# Patient Record
Sex: Female | Born: 1950 | Race: White | Hispanic: No | Smoking: Never smoker
Health system: Southern US, Community
[De-identification: ages and names within clinical notes are randomized; demographics above are authoritative.]

## PROBLEM LIST (undated history)

## (undated) DIAGNOSIS — N393 Stress incontinence (female) (male): Secondary | ICD-10-CM

## (undated) DIAGNOSIS — K635 Polyp of colon: Secondary | ICD-10-CM

## (undated) DIAGNOSIS — K439 Ventral hernia without obstruction or gangrene: Secondary | ICD-10-CM

## (undated) DIAGNOSIS — E079 Disorder of thyroid, unspecified: Secondary | ICD-10-CM

## (undated) DIAGNOSIS — I10 Essential (primary) hypertension: Secondary | ICD-10-CM

## (undated) DIAGNOSIS — Z5189 Encounter for other specified aftercare: Secondary | ICD-10-CM

## (undated) DIAGNOSIS — G43909 Migraine, unspecified, not intractable, without status migrainosus: Secondary | ICD-10-CM

## (undated) DIAGNOSIS — I2699 Other pulmonary embolism without acute cor pulmonale: Secondary | ICD-10-CM

## (undated) DIAGNOSIS — M47817 Spondylosis without myelopathy or radiculopathy, lumbosacral region: Secondary | ICD-10-CM

## (undated) DIAGNOSIS — Z87820 Personal history of traumatic brain injury: Secondary | ICD-10-CM

## (undated) DIAGNOSIS — J189 Pneumonia, unspecified organism: Secondary | ICD-10-CM

## (undated) DIAGNOSIS — D126 Benign neoplasm of colon, unspecified: Secondary | ICD-10-CM

## (undated) DIAGNOSIS — N200 Calculus of kidney: Secondary | ICD-10-CM

## (undated) DIAGNOSIS — R0902 Hypoxemia: Secondary | ICD-10-CM

## (undated) DIAGNOSIS — K219 Gastro-esophageal reflux disease without esophagitis: Secondary | ICD-10-CM

## (undated) DIAGNOSIS — K5732 Diverticulitis of large intestine without perforation or abscess without bleeding: Secondary | ICD-10-CM

## (undated) DIAGNOSIS — R569 Unspecified convulsions: Secondary | ICD-10-CM

## (undated) DIAGNOSIS — I890 Lymphedema, not elsewhere classified: Secondary | ICD-10-CM

## (undated) DIAGNOSIS — E669 Obesity, unspecified: Secondary | ICD-10-CM

## (undated) DIAGNOSIS — H8109 Meniere's disease, unspecified ear: Secondary | ICD-10-CM

## (undated) DIAGNOSIS — E785 Hyperlipidemia, unspecified: Secondary | ICD-10-CM

## (undated) DIAGNOSIS — N189 Chronic kidney disease, unspecified: Secondary | ICD-10-CM

## (undated) DIAGNOSIS — K469 Unspecified abdominal hernia without obstruction or gangrene: Secondary | ICD-10-CM

## (undated) DIAGNOSIS — C801 Malignant (primary) neoplasm, unspecified: Secondary | ICD-10-CM

## (undated) DIAGNOSIS — G709 Myoneural disorder, unspecified: Secondary | ICD-10-CM

## (undated) DIAGNOSIS — K5730 Diverticulosis of large intestine without perforation or abscess without bleeding: Secondary | ICD-10-CM

## (undated) DIAGNOSIS — T7840XA Allergy, unspecified, initial encounter: Secondary | ICD-10-CM

## (undated) DIAGNOSIS — K222 Esophageal obstruction: Secondary | ICD-10-CM

## (undated) HISTORY — DX: Essential (primary) hypertension: I10

## (undated) HISTORY — DX: Encounter for other specified aftercare: Z51.89

## (undated) HISTORY — DX: Calculus of kidney: N20.0

## (undated) HISTORY — DX: Pneumonia, unspecified organism: J18.9

## (undated) HISTORY — DX: Meniere's disease, unspecified ear: H81.09

## (undated) HISTORY — PX: APPENDECTOMY: SHX54

## (undated) HISTORY — PX: FEMORAL VARUS OSTEOTOMY W/ ADDUCTOR RELEASE AND ILIAC CREST BONE GRAFT, PELVIC OSTEOTOMY: SHX1591

## (undated) HISTORY — DX: Migraine, unspecified, not intractable, without status migrainosus: G43.909

## (undated) HISTORY — DX: Personal history of traumatic brain injury: Z87.820

## (undated) HISTORY — DX: Benign neoplasm of colon, unspecified: D12.6

## (undated) HISTORY — DX: Spondylosis without myelopathy or radiculopathy, lumbosacral region: M47.817

## (undated) HISTORY — DX: Myoneural disorder, unspecified: G70.9

## (undated) HISTORY — DX: Disorder of thyroid, unspecified: E07.9

## (undated) HISTORY — DX: Diverticulosis of large intestine without perforation or abscess without bleeding: K57.30

## (undated) HISTORY — DX: Polyp of colon: K63.5

## (undated) HISTORY — DX: Ventral hernia without obstruction or gangrene: K43.9

## (undated) HISTORY — DX: Chronic kidney disease, unspecified: N18.9

## (undated) HISTORY — DX: Malignant (primary) neoplasm, unspecified: C80.1

## (undated) HISTORY — PX: HUMERUS FRACTURE SURGERY: SHX670

## (undated) HISTORY — DX: Stress incontinence (female) (male): N39.3

## (undated) HISTORY — DX: Lymphedema, not elsewhere classified: I89.0

## (undated) HISTORY — DX: Hyperlipidemia, unspecified: E78.5

## (undated) HISTORY — DX: Obesity, unspecified: E66.9

## (undated) HISTORY — DX: Diverticulitis of large intestine without perforation or abscess without bleeding: K57.32

## (undated) HISTORY — DX: Unspecified abdominal hernia without obstruction or gangrene: K46.9

## (undated) HISTORY — PX: PARTIAL COLECTOMY: SHX5273

## (undated) HISTORY — PX: HIP FRACTURE SURGERY: SHX118

## (undated) HISTORY — DX: Other pulmonary embolism without acute cor pulmonale: I26.99

## (undated) HISTORY — DX: Allergy, unspecified, initial encounter: T78.40XA

## (undated) HISTORY — DX: Unspecified convulsions: R56.9

## (undated) HISTORY — PX: TENDON TRANSFER: SHX6109

## (undated) HISTORY — PX: HERNIA REPAIR: SHX51

## (undated) HISTORY — PX: ULNAR NERVE REPAIR: SHX2594

## (undated) HISTORY — DX: Gastro-esophageal reflux disease without esophagitis: K21.9

## (undated) HISTORY — DX: Esophageal obstruction: K22.2

## (undated) HISTORY — PX: OTHER SURGICAL HISTORY: SHX169

## (undated) HISTORY — DX: Hypoxemia: R09.02

---

## 1970-04-08 HISTORY — PX: TUBAL LIGATION: SHX77

## 1988-04-08 HISTORY — PX: ABDOMINAL HYSTERECTOMY: SHX81

## 1993-04-08 HISTORY — PX: LUMBAR FUSION: SHX111

## 1995-04-09 HISTORY — PX: COLON SURGERY: SHX602

## 1998-03-28 ENCOUNTER — Other Ambulatory Visit: Admission: RE | Admit: 1998-03-28 | Discharge: 1998-03-28 | Payer: Self-pay | Admitting: Gastroenterology

## 1998-03-28 ENCOUNTER — Inpatient Hospital Stay (HOSPITAL_COMMUNITY): Admission: EM | Admit: 1998-03-28 | Discharge: 1998-03-30 | Payer: Self-pay | Admitting: Gastroenterology

## 1998-04-01 ENCOUNTER — Encounter: Payer: Self-pay | Admitting: Cardiology

## 1999-10-18 ENCOUNTER — Encounter: Payer: Self-pay | Admitting: Family Medicine

## 1999-10-18 ENCOUNTER — Ambulatory Visit (HOSPITAL_COMMUNITY): Admission: RE | Admit: 1999-10-18 | Discharge: 1999-10-18 | Payer: Self-pay | Admitting: Family Medicine

## 2000-04-26 ENCOUNTER — Encounter: Payer: Self-pay | Admitting: Family Medicine

## 2000-04-26 ENCOUNTER — Ambulatory Visit (HOSPITAL_COMMUNITY): Admission: RE | Admit: 2000-04-26 | Discharge: 2000-04-26 | Payer: Self-pay | Admitting: Family Medicine

## 2000-08-21 ENCOUNTER — Ambulatory Visit (HOSPITAL_COMMUNITY): Admission: RE | Admit: 2000-08-21 | Discharge: 2000-08-21 | Payer: Self-pay | Admitting: Family Medicine

## 2000-09-12 ENCOUNTER — Ambulatory Visit (HOSPITAL_COMMUNITY): Admission: RE | Admit: 2000-09-12 | Discharge: 2000-09-12 | Payer: Self-pay | Admitting: Family Medicine

## 2001-02-06 ENCOUNTER — Encounter (INDEPENDENT_AMBULATORY_CARE_PROVIDER_SITE_OTHER): Payer: Self-pay | Admitting: Specialist

## 2001-02-06 ENCOUNTER — Other Ambulatory Visit: Admission: RE | Admit: 2001-02-06 | Discharge: 2001-02-06 | Payer: Self-pay | Admitting: Gastroenterology

## 2001-04-08 HISTORY — PX: FEMUR FRACTURE SURGERY: SHX633

## 2001-08-04 ENCOUNTER — Ambulatory Visit (HOSPITAL_COMMUNITY): Admission: RE | Admit: 2001-08-04 | Discharge: 2001-08-04 | Payer: Self-pay | Admitting: Family Medicine

## 2001-08-04 ENCOUNTER — Encounter: Payer: Self-pay | Admitting: Family Medicine

## 2001-10-13 ENCOUNTER — Encounter: Payer: Self-pay | Admitting: Surgery

## 2001-10-14 ENCOUNTER — Inpatient Hospital Stay (HOSPITAL_COMMUNITY): Admission: RE | Admit: 2001-10-14 | Discharge: 2001-10-16 | Payer: Self-pay | Admitting: Surgery

## 2001-12-14 ENCOUNTER — Encounter: Payer: Self-pay | Admitting: Surgery

## 2001-12-14 ENCOUNTER — Ambulatory Visit (HOSPITAL_COMMUNITY): Admission: RE | Admit: 2001-12-14 | Discharge: 2001-12-14 | Payer: Self-pay | Admitting: Surgery

## 2002-03-16 ENCOUNTER — Encounter: Payer: Self-pay | Admitting: *Deleted

## 2002-03-16 ENCOUNTER — Emergency Department (HOSPITAL_COMMUNITY): Admission: EM | Admit: 2002-03-16 | Discharge: 2002-03-16 | Payer: Self-pay | Admitting: *Deleted

## 2002-04-16 ENCOUNTER — Encounter: Payer: Self-pay | Admitting: *Deleted

## 2002-04-16 ENCOUNTER — Emergency Department (HOSPITAL_COMMUNITY): Admission: EM | Admit: 2002-04-16 | Discharge: 2002-04-16 | Payer: Self-pay | Admitting: *Deleted

## 2006-02-18 ENCOUNTER — Ambulatory Visit: Payer: Self-pay | Admitting: Gastroenterology

## 2006-04-09 ENCOUNTER — Ambulatory Visit: Payer: Self-pay | Admitting: Gastroenterology

## 2007-10-02 LAB — HM PAP SMEAR

## 2007-11-25 LAB — FECAL OCCULT BLOOD, GUAIAC: Fecal Occult Blood: NEGATIVE

## 2008-02-10 ENCOUNTER — Ambulatory Visit: Payer: Self-pay | Admitting: Cardiology

## 2008-02-16 ENCOUNTER — Ambulatory Visit: Payer: Self-pay

## 2009-12-13 LAB — HM MAMMOGRAPHY

## 2010-06-27 ENCOUNTER — Encounter: Payer: Self-pay | Admitting: Family Medicine

## 2010-06-27 DIAGNOSIS — E785 Hyperlipidemia, unspecified: Secondary | ICD-10-CM

## 2010-06-27 DIAGNOSIS — K2270 Barrett's esophagus without dysplasia: Secondary | ICD-10-CM

## 2010-06-27 DIAGNOSIS — G43909 Migraine, unspecified, not intractable, without status migrainosus: Secondary | ICD-10-CM | POA: Insufficient documentation

## 2010-06-27 DIAGNOSIS — K469 Unspecified abdominal hernia without obstruction or gangrene: Secondary | ICD-10-CM

## 2010-06-27 DIAGNOSIS — M47817 Spondylosis without myelopathy or radiculopathy, lumbosacral region: Secondary | ICD-10-CM

## 2010-06-27 DIAGNOSIS — R5383 Other fatigue: Secondary | ICD-10-CM

## 2010-06-27 DIAGNOSIS — R319 Hematuria, unspecified: Secondary | ICD-10-CM | POA: Insufficient documentation

## 2010-06-27 DIAGNOSIS — E669 Obesity, unspecified: Secondary | ICD-10-CM

## 2010-06-27 DIAGNOSIS — N393 Stress incontinence (female) (male): Secondary | ICD-10-CM

## 2010-06-27 DIAGNOSIS — K5730 Diverticulosis of large intestine without perforation or abscess without bleeding: Secondary | ICD-10-CM

## 2010-06-27 DIAGNOSIS — D126 Benign neoplasm of colon, unspecified: Secondary | ICD-10-CM

## 2010-06-27 DIAGNOSIS — K5732 Diverticulitis of large intestine without perforation or abscess without bleeding: Secondary | ICD-10-CM

## 2010-06-27 DIAGNOSIS — I10 Essential (primary) hypertension: Secondary | ICD-10-CM

## 2010-06-27 DIAGNOSIS — K222 Esophageal obstruction: Secondary | ICD-10-CM
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
## 2010-08-24 NOTE — Op Note (Signed)
[facility]  Patient:    Selena Hunt Visit Number: [id number] MRN: [id number]          Service Type: SUR Location: 3W [id number] 01 Attending Physician:  Shelly Rubenstein Dictated by:   Abigail Miyamoto, M.D. Proc. Date: 10/13/01 Admit Date:  10/13/2001 Discharge Date: 10/16/2001                             Operative Report  PREOPERATIVE DIAGNOSIS:  Ventral incisional hernia.  POSTOPERATIVE DIAGNOSIS:  Ventral incisional hernia.  PROCEDURE:  Laparoscopic converted to open ventral incisional hernia repair with mesh.  SURGEON:  Abigail Miyamoto, M.D.  ASSISTANT:  Sheppard Plumber. Earlene Plater, M.D.  ANESTHESIA:  General endotracheal anesthesia.  ESTIMATED BLOOD LOSS:  Minimal.  INDICATIONS FOR PROCEDURE:  Selena Hunt is a 60 year old female who has had multiple abdominal procedures who presents with a ventral incisional hernia. She was interested in a laparoscopic repair so an attempt will be made at laparoscopic surgery.  FINDINGS:  The patient was found to have dense adhesions of omentum and small bowel to the abdominal wall. Laparoscopy could not be performed to fix the hernia. The hernia was repaired with a piece of 15 x 20 cm Prolene mesh.  DESCRIPTION OF PROCEDURE:  The patient was brought to the operating room, identified as Selena Hunt. She was placed supine on the operating room table and general anesthesia was introduced. Her abdomen was then prepped and draped in the usual sterile fashion. Using a #10 blade, a small transverse incision was made in the patients right upper quadrant. The incision will be carried down through the fascia and muscle layers with the electrocautery. The peritoneum was then identified and opened. A #0 Vicryl pursestring suture was then placed around the fascial opening. The Hasson port was placed into the opening and insufflation of the abdomen was begun. Upon entering the abdomen with a camera, the patient was  found to have omentum and small bowel densely adherent over the entire midline. No free area could be identified in the abdominal wall or peritoneal cavity. At this point, it became evident that laparoscopy could not be performed. The camera was thus removed. A midline incision was then created with a #10 scalpel. The previous scar was then excised. The incision was carried down through the fascia with the electrocautery. Several of the small hernia defects in the midline were then connected opening the fascia the entire length of the incision. The patient was found to have omentum and small bowel densely adherent to the abdominal wall. Lysis of adhesions was carried out freeing up the abdominal wall from the omentum and bowel circumferentially. Large skin flaps were then created circumferentially with the electrocautery. The midline fascia was then closed with a running #1 Novofil suture. The open wound was then thoroughly irrigated with normal saline. A piece of Prolene mesh was then brought onto the field and fashioned into a 20 x 15 cm piece of mesh and then placed in an onlay fashion over the midline incision going out wide laterally and covering the old ostomy site. The mesh was then sewn in place with circumferential #1 Novofil pop-off sutures. The wound was then again irrigated. A separate skin incision was then made and a 19 [place] Blake drain was placed over top of the mesh. This was sewn in place with a nylon suture. The subcutaneous layer in the midline was then closed with a running 3-0 Vicryl  suture. The skin was then closed with skin staples. Another #0 Vicryl pursestring suture was placed at the port site in the fascia, both sutures then used to close the fascial defect. All skin incisions were then closed with skin staples. The patient tolerated the procedure well. All sponge, needle and instrument counts were correct at the end of the procedure. The patient was then  extubated in the operating room and taken in stable condition to the recovery room. Dictated by:   Abigail Miyamoto, M.D. Attending Physician:  Shelly Rubenstein DD:  10/13/01 TD:  10/16/01 Job: [id number]

## 2011-03-19 ENCOUNTER — Encounter: Payer: Self-pay | Admitting: Gastroenterology

## 2011-08-19 ENCOUNTER — Encounter: Payer: Self-pay | Admitting: Cardiology

## 2012-07-20 ENCOUNTER — Other Ambulatory Visit: Payer: Self-pay | Admitting: Family Medicine

## 2012-08-18 ENCOUNTER — Other Ambulatory Visit: Payer: Self-pay | Admitting: *Deleted

## 2012-08-18 DIAGNOSIS — N63 Unspecified lump in unspecified breast: Secondary | ICD-10-CM

## 2012-08-27 ENCOUNTER — Encounter (INDEPENDENT_AMBULATORY_CARE_PROVIDER_SITE_OTHER): Payer: Self-pay | Admitting: General Surgery

## 2012-08-27 ENCOUNTER — Ambulatory Visit (INDEPENDENT_AMBULATORY_CARE_PROVIDER_SITE_OTHER): Payer: Medicare Other | Admitting: General Surgery

## 2012-08-27 VITALS — BP 128/72 | HR 59 | Temp 98.8°F | Resp 18 | Ht 61.0 in | Wt 205.0 lb

## 2012-08-27 DIAGNOSIS — N6320 Unspecified lump in the left breast, unspecified quadrant: Secondary | ICD-10-CM | POA: Insufficient documentation

## 2012-08-27 DIAGNOSIS — N63 Unspecified lump in unspecified breast: Secondary | ICD-10-CM
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
## 2012-08-27 NOTE — Patient Instructions (Signed)
Your breast exam today is normal. I do not feel anything abnormal in the breast or in the regional lymph nodes.  Your recent mammograms and ultrasound show a stable mass in the upper outer quadrant left breast, most likely a benign lymph node. This has been present before and is unchanged. This makes it very unlikely to be a cancer.  I do not recommend biopsy or surgery at this point in time.  Be sure to get bilateral mammograms and a physician breast exam in one year and yearly thereafter.  Return to see Dr. Derrell Lolling if any new problems arise.

## 2012-09-16 ENCOUNTER — Telehealth: Payer: Self-pay | Admitting: *Deleted

## 2012-09-16 ENCOUNTER — Ambulatory Visit (INDEPENDENT_AMBULATORY_CARE_PROVIDER_SITE_OTHER): Payer: Medicare Other

## 2012-09-16 ENCOUNTER — Ambulatory Visit: Payer: Self-pay

## 2012-09-16 ENCOUNTER — Other Ambulatory Visit: Payer: Self-pay | Admitting: Family Medicine

## 2012-09-16 DIAGNOSIS — Z9189 Other specified personal risk factors, not elsewhere classified: Secondary | ICD-10-CM

## 2012-09-16 DIAGNOSIS — N959 Unspecified menopausal and perimenopausal disorder: Secondary | ICD-10-CM

## 2012-09-16 DIAGNOSIS — N951 Menopausal and female climacteric states: Secondary | ICD-10-CM

## 2012-09-16 DIAGNOSIS — Z789 Other specified health status: Secondary | ICD-10-CM

## 2012-09-17 ENCOUNTER — Encounter: Payer: Self-pay | Admitting: Family Medicine

## 2012-09-17 ENCOUNTER — Ambulatory Visit (INDEPENDENT_AMBULATORY_CARE_PROVIDER_SITE_OTHER): Payer: Medicare Other | Admitting: Family Medicine

## 2012-09-17 VITALS — BP 106/65 | HR 62 | Temp 98.3°F | Ht 61.5 in | Wt 201.4 lb

## 2012-09-17 DIAGNOSIS — E039 Hypothyroidism, unspecified: Secondary | ICD-10-CM

## 2012-09-17 DIAGNOSIS — E559 Vitamin D deficiency, unspecified: Secondary | ICD-10-CM

## 2012-09-17 DIAGNOSIS — I10 Essential (primary) hypertension: Secondary | ICD-10-CM

## 2012-09-17 DIAGNOSIS — R5381 Other malaise: Secondary | ICD-10-CM

## 2012-09-17 DIAGNOSIS — Z139 Encounter for screening, unspecified: Secondary | ICD-10-CM

## 2012-09-17 DIAGNOSIS — E785 Hyperlipidemia, unspecified: Secondary | ICD-10-CM

## 2012-09-17 DIAGNOSIS — K219 Gastro-esophageal reflux disease without esophagitis: Secondary | ICD-10-CM

## 2012-09-17 DIAGNOSIS — R5383 Other fatigue: Secondary | ICD-10-CM
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
## 2012-09-17 NOTE — Patient Instructions (Addendum)
Fall precautions discussed Continue current meds and therapeutic lifestyle changes We will arrange visit for Pap and pelvic with Dr. Adine Madura. We will remind him to check the breasts and review the abnormal mammogram. Please try the less clumsy and paid more attention to her movements Drink plenty of fluids

## 2012-10-20 ENCOUNTER — Encounter: Payer: Self-pay | Admitting: Obstetrics & Gynecology

## 2012-10-20 ENCOUNTER — Ambulatory Visit (INDEPENDENT_AMBULATORY_CARE_PROVIDER_SITE_OTHER): Payer: MEDICARE | Admitting: Obstetrics & Gynecology

## 2012-10-20 VITALS — BP 138/87 | HR 64 | Resp 16 | Ht 61.0 in | Wt 203.0 lb

## 2012-10-20 DIAGNOSIS — Z01419 Encounter for gynecological examination (general) (routine) without abnormal findings: Secondary | ICD-10-CM

## 2012-10-20 NOTE — Progress Notes (Signed)
Patient ID: Selena Hunt, female   DOB: [birth year], 62 y.o.   MRN: [id number]  Chief Complaint  Patient presents with  . Gynecologic Exam    HPI Selena Hunt is a 62 y.o. female.  No obstetric history on file. No LMP recorded. Patient has had a hysterectomy. Referred by Dr. Christell Constant for pelvic exam. She had a TAH/BSO, ant and posterior repair for benign disease 1990. Recent evaluation by Dr. Derrell Lolling for abnormal mammogram. HPI  Past Medical History  Diagnosis Date  . Hernia of unspecified site of abdominal cavity without mention of obstruction or gangrene   . Migraine headache   . Lumbosacral spondylosis without myelopathy   . Diverticulosis of colon (without mention of hemorrhage)   . Diverticulitis of colon (without mention of hemorrhage)   . Obesity   . Fatigue   . Female stress incontinence   . Essential hypertension, benign   . Hematuria   . Other and unspecified hyperlipidemia   . Benign neoplasm of colon   . Barrett's esophagus   . Stricture and stenosis of esophagus   . Blood transfusion without reported diagnosis   . GERD (gastroesophageal reflux disease)   . Chronic kidney disease   . Seizures   . Thyroid disease   . Pulmonary embolism     Past Surgical History  Procedure Laterality Date  . Abdominal hysterectomy  1990  . Back surgery  1995    Fusion L4 - L5  . Colon surgery  1997    Removed  . Tubal ligation  1972  . Femur fracture surgery    . Hip fracture surgery    . Ulnar nerve repair    . Tendon transfer    . Humerus fracture surgery    . Femoral varus osteotomy w/ adductor release and iliac crest bone graft, pelvic osteotomy      Family History  Problem Relation Age of Onset  . Alzheimer's disease Mother   . Alzheimer's disease Father     Social History History  Substance Use Topics  . Smoking status: Never Smoker   . Smokeless tobacco: Never Used  . Alcohol Use: No    Allergies  Allergen Reactions  . Ambien (Zolpidem Tartrate) Anxiety   . Diovan (Valsartan) Other (See Comments)    Seizures.  . Wellbutrin (Bupropion) Other (See Comments)    Seizures.  . Codeine   . Feldene (Piroxicam)   . Guaifenesin Er   . Latex   . Lipitor (Atorvastatin Calcium)   . Neurontin (Gabapentin)   . Prednisolone   . Zocor (Simvastatin)   . Livalo (Pitavastatin) Swelling    Current Outpatient Prescriptions  Medication Sig Dispense Refill  . calcium-vitamin D (OSCAL WITH D) 500-200 MG-UNIT per tablet Take 1 tablet by mouth daily.      . hydrochlorothiazide (HYDRODIURIL) 25 MG tablet Take 12.5 mg by mouth daily.       Marland Kitchen levothyroxine (SYNTHROID) 100 MCG tablet 1/2 tablet except Mon and Fri 1 tablet      . metoprolol (LOPRESSOR) 50 MG tablet       . multivitamin-iron-minerals-folic acid (CENTRUM) chewable tablet Chew 1 tablet by mouth daily.        Marland Kitchen NEXIUM 40 MG capsule TAKE 1 CAPSULE BY MOUTH TWICE DAILY BEFORE BREAKFAST AND SUPPER  60 capsule  4  . solifenacin (VESICARE) 10 MG tablet Take 5 mg by mouth daily.       . [DISCONTINUED] solifenacin (VESICARE) 10 MG tablet Take 5 mg by  mouth daily.         No current facility-administered medications for this visit.    Review of Systems Review of Systems  Gastrointestinal: Negative for abdominal pain and abdominal distention.  Genitourinary: Negative for dysuria, urgency, vaginal bleeding, vaginal discharge and pelvic pain.       Occsional SUI, no urge    Blood pressure 138/87, pulse 64, resp. rate 16, height 5\' 1"  (1.549 m), weight 203 lb (92.08 kg).  Physical Exam Physical Exam  Constitutional: She appears well-developed. No distress.  Pulmonary/Chest: Effort normal. No respiratory distress.  Breast exam deferred due to recent evaluation by surgeon  Abdominal: Soft. She exhibits no mass.  Genitourinary: No vaginal discharge found.  Vaginal atrophy, no discharge.Pelvic kidney palpable  Cuff intact  Skin: Skin is warm and dry.  Psychiatric: She has a normal mood and affect. Her  behavior is normal.    Data Reviewed Office note  Assessment    Benign lymph node on mammogram, h/o TAH/BSO Mild SUI, patient doesn't was further evaluation     Plan    Pap tests not indicated.Needs yearly mammogram and breast exams.        ARNOLD,JAMES 10/20/2012, 11:10 AM

## 2012-10-20 NOTE — Patient Instructions (Signed)
Mammography Screening Recommendations Women ages 9 to 50 should get screening mammograms every one to two years if they are at average risk for breast cancer. For women 50 years and older, mammograms are recommended every one to two years. Women who are at higher than average risk should start mammography before age 64. They should also find out how often they should get screened in their 40's and 50's.  HIGHER RISK WOMEN ARE THOSE WHO:  Have had breast cancer.  Have more than one family member that has or has had breast cancer.  Carry genetic changes, which make them more likely to get breast cancer.  Have a breast disease that may predispose them to cancer.  Have had two or more breast biopsies for benign disease.  Have 75% or more dense breast tissue on past mammograms.  Gave birth at age 21 or older.  Have never been pregnant.  Are on hormone therapy.  Began their menstrual periods before age 59.  Have a late menopause (55 years old or older).  Have had high doses of radiation to the chest.  Never breastfed.  Are going through menopause and are obese.  Have had cancer of the uterus, ovary or intestine.  Are Jewish.  Drink a lot of alcohol.  Have breast implants. Women without the risk factors above are considered to be at average risk of developing breast cancer. MAMMOGRAPHY LIMITATIONS There are limitations of mammography. That is why clinical breast exams by your caregiver and monthly self breast exams are important. Some limitations include:  Having a high percentage of women who get mammograms with results that are not cancer, but need further testing such as:  Another mammogram.  Fine needle aspiration.  Ultrasound.  Biopsy.  Women who have yearly mammograms in their 71's have about a 30% chance of having a "false-positive" mammogram. This means the mammogram suggests a problem, but on further testing, there is no problem. This is called a "false  positive."  About 25% of breast tumors are missed in women in their 40's, compared to 10% for women in their 50's. This is due to dense breast tissue. RESEARCH  Regular screening of average risk women in their 40's reduces deaths from breast cancer by about 17%.  Research is under way in imaging technology such as MRI's, breast ultrasound and breast-specific position emission tomography to overcome mammography limitations.  In addition to imaging technologies, scientists are exploring methods to detect traces of breast cancer in blood, urine, or nipple fluid and to detect genetic alterations in women who are at increased risk for breast cancer.  There is little to no risk of getting too much radiation during a screening.  Regular screening and early detection of breast cancer reduces costs and further therapy such as radiation, chemotherapy and breast reconstruction. For further information contact the Cancer Information Service (CIS).  CIS, a national information and education network, is a Public house manager of the [employer]. This is the federal government's primary agency for cancer research.  The CIS meets the information needs of patients, the public and health professionals.  Trained staff provides the latest scientific information in understandable language. CIS staff answers questions in [place] and Spanish and gives out [employer].  1-800-4-CANCER 734-479-8188).  TTY: 7-829-562-1308.  https://www.george-aguilar.net/.  http://www.reyes-bennett.biz/. Document Released: 05/17/2005 Document Revised: 06/17/2011 Document Reviewed: 03/22/2008 [facility] Patient Information 2014 [place], [place].

## 2012-12-17 ENCOUNTER — Other Ambulatory Visit: Payer: Self-pay | Admitting: *Deleted

## 2012-12-17 MED ORDER — AZITHROMYCIN 250 MG PO TABS: ORAL_TABLET | ORAL | Status: DC

## 2013-02-01 ENCOUNTER — Ambulatory Visit: Payer: Medicare Other | Admitting: Family Medicine

## 2013-02-05 ENCOUNTER — Other Ambulatory Visit (INDEPENDENT_AMBULATORY_CARE_PROVIDER_SITE_OTHER): Payer: Medicare Other

## 2013-02-05 DIAGNOSIS — R5383 Other fatigue: Secondary | ICD-10-CM

## 2013-02-05 DIAGNOSIS — E785 Hyperlipidemia, unspecified: Secondary | ICD-10-CM

## 2013-02-05 DIAGNOSIS — I10 Essential (primary) hypertension: Secondary | ICD-10-CM

## 2013-02-05 DIAGNOSIS — R5381 Other malaise: Secondary | ICD-10-CM

## 2013-02-05 DIAGNOSIS — E559 Vitamin D deficiency, unspecified: Secondary | ICD-10-CM

## 2013-02-05 LAB — BASIC METABOLIC PANEL WITH GFR
CO2: 29 mEq/L (ref 19–32)
Chloride: 108 mEq/L (ref 96–112)
Creat: 1.05 mg/dL (ref 0.50–1.10)
GFR, Est Non African American: 56 mL/min — ABNORMAL LOW
Glucose, Bld: 96 mg/dL (ref 70–99)
Sodium: 143 mEq/L (ref 135–145)

## 2013-02-05 LAB — HEPATIC FUNCTION PANEL
Alkaline Phosphatase: 86 U/L (ref 39–117)
Bilirubin, Direct: 0.1 mg/dL (ref 0.0–0.3)
Indirect Bilirubin: 0.4 mg/dL (ref 0.0–0.9)
Total Bilirubin: 0.5 mg/dL (ref 0.3–1.2)
Total Protein: 5.8 g/dL — ABNORMAL LOW (ref 6.0–8.3)

## 2013-02-05 LAB — POCT CBC
Granulocyte percent: 74.6 %G (ref 37–80)
HCT, POC: 42.6 % (ref 37.7–47.9)
Hemoglobin: 14.5 g/dL (ref 12.2–16.2)
MCH, POC: 28.5 pg (ref 27–31.2)
MPV: 9.5 fL (ref 0–99.8)
POC Granulocyte: 3.4 (ref 2–6.9)
POC LYMPH PERCENT: 23.2 %L (ref 10–50)
RBC: 5.1 M/uL (ref 4.04–5.48)

## 2013-02-05 NOTE — Progress Notes (Signed)
Patient came in for labs only.

## 2013-02-06 LAB — VITAMIN D 25 HYDROXY (VIT D DEFICIENCY, FRACTURES): Vit D, 25-Hydroxy: 34 ng/mL (ref 30–89)

## 2013-02-07 LAB — NMR LIPOPROFILE WITH LIPIDS
Cholesterol, Total: 194 mg/dL (ref <200)
HDL Particle Number: 31.7 umol/L (ref >=30.5)
HDL Size: 8.5 nm — ABNORMAL LOW (ref >=9.2)
HDL-C: 44 mg/dL (ref >=40)
LDL Particle Number: 2045 nmol/L — ABNORMAL HIGH (ref <1000)
LP-IR Score: 49 — ABNORMAL HIGH (ref <=45)
Large HDL-P: 3.6 umol/L — ABNORMAL LOW (ref >=4.8)
Triglycerides: 120 mg/dL (ref <150)
VLDL Size: 43.2 nm (ref <=46.6)

## 2013-02-08 ENCOUNTER — Encounter: Payer: Self-pay | Admitting: Family Medicine

## 2013-02-08 ENCOUNTER — Ambulatory Visit (INDEPENDENT_AMBULATORY_CARE_PROVIDER_SITE_OTHER): Payer: Medicare Other | Admitting: Family Medicine

## 2013-02-08 ENCOUNTER — Ambulatory Visit (INDEPENDENT_AMBULATORY_CARE_PROVIDER_SITE_OTHER): Payer: Medicare Other

## 2013-02-08 VITALS — BP 125/77 | HR 54 | Temp 98.2°F | Ht 61.0 in | Wt 193.0 lb

## 2013-02-08 DIAGNOSIS — K222 Esophageal obstruction: Secondary | ICD-10-CM

## 2013-02-08 DIAGNOSIS — M25559 Pain in unspecified hip: Secondary | ICD-10-CM

## 2013-02-08 DIAGNOSIS — M25551 Pain in right hip: Secondary | ICD-10-CM

## 2013-02-08 DIAGNOSIS — I10 Essential (primary) hypertension: Secondary | ICD-10-CM

## 2013-02-08 DIAGNOSIS — K2270 Barrett's esophagus without dysplasia: Secondary | ICD-10-CM

## 2013-02-08 DIAGNOSIS — IMO0001 Reserved for inherently not codable concepts without codable children: Secondary | ICD-10-CM

## 2013-02-08 DIAGNOSIS — Z1212 Encounter for screening for malignant neoplasm of rectum: Secondary | ICD-10-CM

## 2013-02-08 DIAGNOSIS — E785 Hyperlipidemia, unspecified: Secondary | ICD-10-CM

## 2013-02-08 DIAGNOSIS — N393 Stress incontinence (female) (male): Secondary | ICD-10-CM

## 2013-02-08 MED ORDER — HYDROCODONE-ACETAMINOPHEN 5-325 MG PO TABS: 1.0000 | ORAL_TABLET | Freq: Every day | ORAL | Status: DC | PRN

## 2013-02-08 NOTE — Patient Instructions (Addendum)
Continue current medications. Continue good therapeutic lifestyle changes.  Fall precautions discussed with patient. Do not quickly, watch for your moving to ! Follow up as planned and earlier as needed.  Check with your daughter about the Prevnar shot which she should get this year. We will call you with the results of the chest x-ray once they are available  We will arrange a visit with the orthopedist here because of the right hip pain

## 2013-02-08 NOTE — Addendum Note (Signed)
Addended by: Orma Render F on: 02/08/2013 02:11 PM   Modules accepted: Orders

## 2013-02-08 NOTE — Progress Notes (Signed)
Subjective:    Patient ID: Selena Hunt, female    DOB: 1950-10-26, 62 y.o.   MRN: [id number]  HPI Pt here for follow up and management of chronic medical problems. Recent labs were reviewed with patient today. She understands that she has to do better with therapeutic lifestyle changes as she is intolerant to statin drugs.     Patient Active Problem List   Diagnosis Date Noted  . Left breast mass 08/27/2012  . Hernia of unspecified site of abdominal cavity without mention of obstruction or gangrene   . Migraine headache   . Lumbosacral spondylosis without myelopathy   . Diverticulosis of colon (without mention of hemorrhage)   . Diverticulitis of colon (without mention of hemorrhage)   . Obesity   . Fatigue   . Female stress incontinence   . Essential hypertension, benign   . Hematuria   . Other and unspecified hyperlipidemia   . Benign neoplasm of colon   . Barrett's esophagus   . Stricture and stenosis of esophagus    Outpatient Encounter Prescriptions as of 02/08/2013  Medication Sig  . calcium-vitamin D (OSCAL WITH D) 500-200 MG-UNIT per tablet Take 1 tablet by mouth daily.  . hydrochlorothiazide (HYDRODIURIL) 25 MG tablet Take 12.5 mg by mouth daily.   Marland Kitchen levothyroxine (SYNTHROID) 100 MCG tablet 1/2 tablet except Mon and Fri 1 tablet  . metoprolol (LOPRESSOR) 50 MG tablet Take 50 mg by mouth 2 (two) times daily.   . multivitamin-iron-minerals-folic acid (CENTRUM) chewable tablet Chew 1 tablet by mouth daily.    Marland Kitchen NEXIUM 40 MG capsule TAKE 1 CAPSULE BY MOUTH TWICE DAILY BEFORE BREAKFAST AND SUPPER  . solifenacin (VESICARE) 10 MG tablet Take 5 mg by mouth daily.   . [DISCONTINUED] azithromycin (ZITHROMAX) 250 MG tablet As directed    Review of Systems  Constitutional: Negative.   HENT: Negative.   Eyes: Negative.   Respiratory: Negative.   Cardiovascular: Negative.   Gastrointestinal: Negative.   Endocrine: Negative.   Genitourinary: Negative.   Musculoskeletal:  Positive for arthralgias (right hip pain- x 2 mos - already had xray).  Skin: Negative.   Allergic/Immunologic: Negative.   Neurological: Negative.   Hematological: Negative.   Psychiatric/Behavioral: Negative.        Objective:   Physical Exam  Nursing note and vitals reviewed. Constitutional: She is oriented to person, place, and time. She appears well-developed and well-nourished.  Pleasant  HENT:  Head: Normocephalic and atraumatic.  Right Ear: External ear normal.  Left Ear: External ear normal.  Mouth/Throat: Oropharynx is clear and moist. No oropharyngeal exudate.  Nasal congestion bilaterally  Eyes: Conjunctivae and EOM are normal. Pupils are equal, round, and reactive to light. Right eye exhibits no discharge. Left eye exhibits no discharge. No scleral icterus.  Neck: Normal range of motion. Neck supple. No JVD present. No thyromegaly present.  No carotid bruits  Cardiovascular: Normal rate, regular rhythm, normal heart sounds and intact distal pulses.  Exam reveals no gallop and no friction rub.   No murmur heard. At 72 per minute  Pulmonary/Chest: Effort normal and breath sounds normal. No respiratory distress. She has no wheezes. She has no rales. She exhibits no tenderness.  Abdominal: Soft. Bowel sounds are normal. She exhibits no distension and no mass. There is no tenderness. There is no rebound and no guarding.  Obesity is present  Musculoskeletal: Normal range of motion. She exhibits edema. She exhibits no tenderness.  Legs are tender to palpation and there may be  slight edema in both lower extremity, there is limited range of motion with her lower extremities  Lymphadenopathy:    She has no cervical adenopathy.  Neurological: She is alert and oriented to person, place, and time. No cranial nerve deficit.  Lower extremity DTRs are diminished bilaterally  Skin: Skin is warm and dry.  Psychiatric: She has a normal mood and affect. Her behavior is normal. Judgment  and thought content normal.   BP 125/77  Pulse 54  Temp(Src) 98.2 F (36.8 C) (Oral)  Ht 5\' 1"  (1.549 m)  Wt 193 lb (87.544 kg)  BMI 36.49 kg/m2  [facility] reading (PRIMARY) by  Dr. Christell Constant: Chest x-ray: Within normal limits degenerative changes in spine                                        Assessment & Plan:   1. Other and unspecified hyperlipidemia   2. Barrett's esophagus   3. Essential hypertension, benign   4. Female stress incontinence   5. Stricture and stenosis of esophagus   6. 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   Chest x-ray this visit Patient Instructions  Continue current medications. Continue good therapeutic lifestyle changes.  Fall precautions discussed with patient. Do not quickly, watch for your moving to ! Follow up as planned and earlier as needed.  Check with your daughter about the Prevnar shot which she should get this year. We will call you with the results of the chest x-ray once they are available  We will arrange a visit with the orthopedist here because of the right hip pain    Nyra Capes MD

## 2013-02-09 LAB — FECAL OCCULT BLOOD, IMMUNOCHEMICAL: Fecal Occult Blood: NEGATIVE

## 2013-03-18 ENCOUNTER — Other Ambulatory Visit: Payer: Self-pay | Admitting: Family Medicine

## 2013-05-07 ENCOUNTER — Other Ambulatory Visit: Payer: Self-pay | Admitting: Family Medicine

## 2013-05-18 ENCOUNTER — Ambulatory Visit (INDEPENDENT_AMBULATORY_CARE_PROVIDER_SITE_OTHER): Payer: Medicare Other | Admitting: Family Medicine

## 2013-05-18 ENCOUNTER — Encounter: Payer: Self-pay | Admitting: Family Medicine

## 2013-05-18 VITALS — BP 117/69 | HR 59 | Temp 99.2°F | Ht 61.0 in | Wt 197.0 lb

## 2013-05-18 DIAGNOSIS — M25551 Pain in right hip: Secondary | ICD-10-CM

## 2013-05-18 DIAGNOSIS — M25559 Pain in unspecified hip: Secondary | ICD-10-CM

## 2013-05-18 DIAGNOSIS — K2270 Barrett's esophagus without dysplasia: Secondary | ICD-10-CM

## 2013-05-18 DIAGNOSIS — E559 Vitamin D deficiency, unspecified: Secondary | ICD-10-CM

## 2013-05-18 DIAGNOSIS — E8881 Metabolic syndrome: Secondary | ICD-10-CM

## 2013-05-18 DIAGNOSIS — E785 Hyperlipidemia, unspecified: Secondary | ICD-10-CM

## 2013-05-18 DIAGNOSIS — I10 Essential (primary) hypertension: Secondary | ICD-10-CM

## 2013-05-18 DIAGNOSIS — K5732 Diverticulitis of large intestine without perforation or abscess without bleeding: Secondary | ICD-10-CM

## 2013-05-18 DIAGNOSIS — M47817 Spondylosis without myelopathy or radiculopathy, lumbosacral region: Secondary | ICD-10-CM

## 2013-05-18 NOTE — Patient Instructions (Addendum)
Continue current medications. Continue good therapeutic lifestyle changes which include good diet and exercise. Fall precautions discussed with patient. Schedule your flu vaccine if you haven't had it yet If you are over 65 years old - you may need Prevnar 73 or the adult Pneumonia vaccine. Follow-through with physical therapy Return to clinic for lab work If problems continue with the heat up beyond the next 2-3 months we will reschedule you with the orthopedist We will call you with the results for lab work once those results are available
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
## 2013-06-05 ENCOUNTER — Other Ambulatory Visit: Payer: Self-pay | Admitting: Family Medicine

## 2013-08-02 ENCOUNTER — Other Ambulatory Visit: Payer: Self-pay | Admitting: Family Medicine

## 2013-08-03 ENCOUNTER — Other Ambulatory Visit: Payer: Self-pay

## 2013-08-03 MED ORDER — LEVOTHYROXINE SODIUM 100 MCG PO TABS: 100.0000 ug | ORAL_TABLET | Freq: Every day | ORAL | Status: DC

## 2013-08-03 NOTE — Telephone Encounter (Signed)
Last seen 05/18/13  DWM  No thyroid labs since EPIC

## 2013-08-03 NOTE — Telephone Encounter (Signed)
Last seen 05/18/13  DWM  No thyroid since EPIC

## 2013-08-25 ENCOUNTER — Other Ambulatory Visit: Payer: Self-pay | Admitting: Family Medicine

## 2013-09-04 ENCOUNTER — Observation Stay (HOSPITAL_COMMUNITY)
Admission: EM | Admit: 2013-09-04 | Discharge: 2013-09-05 | Disposition: A | Discharge status: Home / Self Care | Payer: Medicare Other | Attending: Internal Medicine | Admitting: Internal Medicine

## 2013-09-04 ENCOUNTER — Encounter (HOSPITAL_COMMUNITY): Payer: Self-pay | Admitting: Emergency Medicine

## 2013-09-04 ENCOUNTER — Emergency Department (HOSPITAL_COMMUNITY): Payer: Medicare Other

## 2013-09-04 DIAGNOSIS — G43909 Migraine, unspecified, not intractable, without status migrainosus: Secondary | ICD-10-CM | POA: Insufficient documentation

## 2013-09-04 DIAGNOSIS — K219 Gastro-esophageal reflux disease without esophagitis: Secondary | ICD-10-CM | POA: Insufficient documentation

## 2013-09-04 DIAGNOSIS — M47817 Spondylosis without myelopathy or radiculopathy, lumbosacral region: Secondary | ICD-10-CM | POA: Insufficient documentation

## 2013-09-04 DIAGNOSIS — R0602 Shortness of breath: Secondary | ICD-10-CM | POA: Insufficient documentation

## 2013-09-04 DIAGNOSIS — K5730 Diverticulosis of large intestine without perforation or abscess without bleeding: Secondary | ICD-10-CM | POA: Insufficient documentation

## 2013-09-04 DIAGNOSIS — N189 Chronic kidney disease, unspecified: Secondary | ICD-10-CM | POA: Insufficient documentation

## 2013-09-04 DIAGNOSIS — R5383 Other fatigue: Secondary | ICD-10-CM | POA: Diagnosis present

## 2013-09-04 DIAGNOSIS — Z9049 Acquired absence of other specified parts of digestive tract: Secondary | ICD-10-CM | POA: Insufficient documentation

## 2013-09-04 DIAGNOSIS — Z981 Arthrodesis status: Secondary | ICD-10-CM | POA: Insufficient documentation

## 2013-09-04 DIAGNOSIS — R5381 Other malaise: Secondary | ICD-10-CM | POA: Insufficient documentation

## 2013-09-04 DIAGNOSIS — K222 Esophageal obstruction: Secondary | ICD-10-CM

## 2013-09-04 DIAGNOSIS — D126 Benign neoplasm of colon, unspecified: Secondary | ICD-10-CM

## 2013-09-04 DIAGNOSIS — E669 Obesity, unspecified: Secondary | ICD-10-CM

## 2013-09-04 DIAGNOSIS — K5732 Diverticulitis of large intestine without perforation or abscess without bleeding: Secondary | ICD-10-CM

## 2013-09-04 DIAGNOSIS — E8881 Metabolic syndrome: Secondary | ICD-10-CM

## 2013-09-04 DIAGNOSIS — I129 Hypertensive chronic kidney disease with stage 1 through stage 4 chronic kidney disease, or unspecified chronic kidney disease: Secondary | ICD-10-CM | POA: Insufficient documentation

## 2013-09-04 DIAGNOSIS — K2270 Barrett's esophagus without dysplasia: Secondary | ICD-10-CM | POA: Insufficient documentation

## 2013-09-04 DIAGNOSIS — R079 Chest pain, unspecified: Principal | ICD-10-CM | POA: Diagnosis present

## 2013-09-04 DIAGNOSIS — E785 Hyperlipidemia, unspecified: Secondary | ICD-10-CM | POA: Diagnosis present

## 2013-09-04 DIAGNOSIS — Z86711 Personal history of pulmonary embolism: Secondary | ICD-10-CM | POA: Insufficient documentation

## 2013-09-04 DIAGNOSIS — I10 Essential (primary) hypertension: Secondary | ICD-10-CM

## 2013-09-04 DIAGNOSIS — N393 Stress incontinence (female) (male): Secondary | ICD-10-CM

## 2013-09-04 LAB — COMPREHENSIVE METABOLIC PANEL
ALK PHOS: 83 U/L (ref 39–117)
ALT: 18 U/L (ref 0–35)
AST: 17 U/L (ref 0–37)
Albumin: 4 g/dL (ref 3.5–5.2)
BILIRUBIN TOTAL: 0.3 mg/dL (ref 0.3–1.2)
BUN: 28 mg/dL — ABNORMAL HIGH (ref 6–23)
CHLORIDE: 100 meq/L (ref 96–112)
CO2: 26 meq/L (ref 19–32)
Calcium: 9.9 mg/dL (ref 8.4–10.5)
Creatinine, Ser: 1.16 mg/dL — ABNORMAL HIGH (ref 0.50–1.10)
GFR, EST AFRICAN AMERICAN: 56 mL/min — AB (ref >90)
GFR, EST NON AFRICAN AMERICAN: 49 mL/min — AB (ref >90)
GLUCOSE: 108 mg/dL — AB (ref 70–99)
POTASSIUM: 3.7 meq/L (ref 3.7–5.3)
SODIUM: 141 meq/L (ref 137–147)
TOTAL PROTEIN: 6.8 g/dL (ref 6.0–8.3)

## 2013-09-04 LAB — CBC
HEMATOCRIT: 43.1 % (ref 36.0–46.0)
HEMOGLOBIN: 14.8 g/dL (ref 12.0–15.0)
MCH: 29.1 pg (ref 26.0–34.0)
MCHC: 34.3 g/dL (ref 30.0–36.0)
MCV: 84.8 fL (ref 78.0–100.0)
Platelets: 216 10*3/uL (ref 150–400)
RBC: 5.08 MIL/uL (ref 3.87–5.11)
RDW: 13.3 % (ref 11.5–15.5)
WBC: 10.8 10*3/uL — ABNORMAL HIGH (ref 4.0–10.5)

## 2013-09-04 LAB — TROPONIN I

## 2013-09-04 LAB — PRO B NATRIURETIC PEPTIDE: PRO B NATRI PEPTIDE: 194 pg/mL — AB (ref 0–125)

## 2013-09-04 LAB — LIPASE, BLOOD: Lipase: 33 U/L (ref 11–59)

## 2013-09-04 MED ORDER — SODIUM CHLORIDE 0.9 % IV SOLN
INTRAVENOUS | Status: DC
  Administered 2013-09-04: 10 mL/h via INTRAVENOUS

## 2013-09-04 MED ORDER — MORPHINE SULFATE 4 MG/ML IJ SOLN
4.0000 mg | Freq: Once | INTRAMUSCULAR | Status: AC
  Administered 2013-09-04: 4 mg via INTRAVENOUS
  Filled 2013-09-04: qty 1

## 2013-09-04 MED ORDER — ONDANSETRON HCL 4 MG/2ML IJ SOLN
4.0000 mg | Freq: Once | INTRAMUSCULAR | Status: AC
  Administered 2013-09-04: 4 mg via INTRAVENOUS
  Filled 2013-09-04: qty 2

## 2013-09-04 MED ORDER — ASPIRIN 81 MG PO CHEW
324.0000 mg | CHEWABLE_TABLET | Freq: Once | ORAL | Status: AC
  Administered 2013-09-04: 324 mg via ORAL
  Filled 2013-09-04: qty 4

## 2013-09-04 MED ORDER — GI COCKTAIL ~~LOC~~
30.0000 mL | Freq: Once | ORAL | Status: AC
  Administered 2013-09-04: 30 mL via ORAL
  Filled 2013-09-04: qty 30

## 2013-09-04 MED ORDER — FAMOTIDINE 20 MG PO TABS
20.0000 mg | ORAL_TABLET | Freq: Once | ORAL | Status: AC
  Administered 2013-09-04: 20 mg via ORAL
  Filled 2013-09-04: qty 1
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
## 2013-09-04 NOTE — H&P (Signed)
PCP:   Redge Gainer, MD   Chief Complaint:  cp  HPI: 63 yo female with h/o htn, hld, obesity comes in with several hours of sscp radiates to both arms and makes them feel numb.  Associated sob, but no n/v.  No fevers or cough. No swelling or edema in legs. The pain was sharp and lasted for several hours and also radiated into her epigastrum.  She has h/o gerd but this was more severe than her regular gerd and was not relieved with her typical gi meds she usually takes.  She did eat some horse raddish today, and the issue started soon after eating that.  ems was called and she was given morphine, gi cocktail , asa, and ntg and her pain was relieved.  Review of Systems:  Positive and negative as per HPI otherwise all other systems are negative  Past Medical History: Past Medical History  Diagnosis Date  . Hernia of unspecified site of abdominal cavity without mention of obstruction or gangrene   . Migraine headache   . Lumbosacral spondylosis without myelopathy   . Diverticulosis of colon (without mention of hemorrhage)   . Diverticulitis of colon (without mention of hemorrhage)   . Obesity   . Fatigue   . Female stress incontinence   . Essential hypertension, benign   . Hematuria   . Other and unspecified hyperlipidemia   . Benign neoplasm of colon   . Barrett's esophagus   . Stricture and stenosis of esophagus   . Blood transfusion without reported diagnosis   . GERD (gastroesophageal reflux disease)   . Chronic kidney disease   . Seizures   . Thyroid disease   . Pulmonary embolism    Past Surgical History  Procedure Laterality Date  . Abdominal hysterectomy  1990  . Back surgery  1995    Fusion L4 - L5  . Colon surgery  1997    Removed  . Tubal ligation  1972  . Femur fracture surgery    . Hip fracture surgery    . Ulnar nerve repair    . Tendon transfer    . Humerus fracture surgery    . Femoral varus osteotomy w/ adductor release and iliac crest bone graft,  pelvic osteotomy    . Partial colectomy      Secondary to diverticulitis    Medications: Prior to Admission medications   Medication Sig Start Date End Date Taking? Authorizing Provider  calcium-vitamin D (OSCAL WITH D) 500-200 MG-UNIT per tablet Take 1 tablet by mouth daily.    Historical Provider, MD  hydrochlorothiazide (HYDRODIURIL) 25 MG tablet TAKE 1 TABLET BY MOUTH ONCE A DAY 03/18/13   Chipper Herb, MD  HYDROcodone-acetaminophen (NORCO/VICODIN) 5-325 MG per tablet Take 1 tablet by mouth daily as needed for pain. 02/08/13   Chipper Herb, MD  metoprolol (LOPRESSOR) 50 MG tablet Take 50 mg by mouth 2 (two) times daily.  07/20/12   Chipper Herb, MD  metoprolol (LOPRESSOR) 50 MG tablet TAKE 1 TABLET BY MOUTH TWICE DAILY AS DIRECTED    Chipper Herb, MD  multivitamin-iron-minerals-folic acid (CENTRUM) chewable tablet Chew 1 tablet by mouth daily.      Historical Provider, MD  NEXIUM 40 MG capsule TAKE 1 CAPSULE BY MOUTH TWICE DAILY BEFORE BREAKFAST AND SUPPER 07/20/12   Chipper Herb, MD  SYNTHROID 100 MCG tablet TAKE 1/2 TABLET BY MOUTH EVERY DAY BEFORE BREAKFAST, EXCEPT ON MONDAY AND FRIDAY TAKE 1 TABLET  Chipper Herb, MD  VESICARE 10 MG tablet TAKE 1 TABLET BY MOUTH EVERY DAY    Chipper Herb, MD    Allergies:   Allergies  Allergen Reactions  . Ambien [Zolpidem Tartrate] Anxiety  . Diovan [Valsartan] Other (See Comments)    Seizures.  . Wellbutrin [Bupropion] Other (See Comments)    Seizures.  . Codeine   . Feldene [Piroxicam]   . Guaifenesin Er   . Latex   . Lipitor [Atorvastatin Calcium]   . Neurontin [Gabapentin]   . Prednisolone   . Zocor [Simvastatin]   . Livalo [Pitavastatin] Swelling    Social History:  reports that she has never smoked. She has never used smokeless tobacco. She reports that she does not drink alcohol or use illicit drugs.  Family History: Family History  Problem Relation Age of Onset  . Alzheimer's disease Mother   . Alzheimer's  disease Father     Physical Exam: Filed Vitals:   09/04/13 2127 09/04/13 2130 09/04/13 2145  BP: 182/87 178/91 174/87  Pulse: 69 60 61  Temp: 97.6 F (36.4 C)    TempSrc: Oral    Resp: 11 13 13   SpO2: 100% 100% 100%   General appearance: alert, cooperative and no distress Head: Normocephalic, without obvious abnormality, atraumatic Eyes: negative Throat: lips, mucosa, and tongue normal; teeth and gums normal Neck: no JVD and supple, symmetrical, trachea midline Lungs: clear to auscultation bilaterally Heart: regular rate and rhythm, S1, S2 normal, no murmur, click, rub or gallop Abdomen: soft, non-tender; bowel sounds normal; no masses,  no organomegaly Extremities: extremities normal, atraumatic, no cyanosis or edema Pulses: 2+ and symmetric Skin: Skin color, texture, turgor normal. No rashes or lesions Neurologic: Grossly normal    Labs on Admission:   Recent Labs  09/04/13 2132  NA 141  K 3.7  CL 100  CO2 26  GLUCOSE 108*  BUN 28*  CREATININE 1.16*  CALCIUM 9.9    Recent Labs  09/04/13 2132  AST 17  ALT 18  ALKPHOS 83  BILITOT 0.3  PROT 6.8  ALBUMIN 4.0    Recent Labs  09/04/13 2132  LIPASE 33    Recent Labs  09/04/13 2132  WBC 10.8*  HGB 14.8  HCT 43.1  MCV 84.8  PLT 216    Recent Labs  09/04/13 2132  TROPONINI <0.30   Radiological Exams on Admission: No results found.  Assessment/Plan  63 yo female with atypical chest pain but multiple cardiac risk factors   Principal Problem:   Chest pain-  obs on tele.  Romi.  Echo in am.  Further outpt stress testing in the near future.  Aspirin.  Cp free at this time.  ekg nsr no acute issues.  Active Problems:  Stable unless o/w noted   Obesity   Fatigue, chronic   Hypertension   Hyperlipidemia    Selena Hunt 09/04/2013, 10:32 PM

## 2013-09-04 NOTE — ED Notes (Signed)
Attempted report x1. 

## 2013-09-04 NOTE — ED Provider Notes (Signed)
CSN: [id number]     Arrival date & time 09/04/13  2116 History   First MD Initiated Contact with Patient 09/04/13 2118     Chief Complaint  Patient presents with  . Chest Pain     (Consider location/radiation/quality/duration/timing/severity/associated sxs/prior Treatment) Patient is a 63 y.o. female presenting with chest pain. The history is provided by the patient.  Chest Pain Associated symptoms: shortness of breath   Associated symptoms: no abdominal pain, no back pain, no cough, no fever, no headache, no nausea, no palpitations and not vomiting   pt c/o midline/lower chest/xiphoid area chest pain onset approximately 1-2 hours ago at rest. Constant, dull, pain. Radiates up into chest and states both arms feel heavy. No hx same pain in past. +sob. No nv or diaphoresis. No pleuritic pain. Denies cough or uri c/o. Pt states she thought may have been reflux, so took tums without relief.  Non smoker. +hx htn/high chol. No hx dm. No fam hx cad. Denies any new leg pain or swelling. No hx dvt/pe. No hx gallstones. Denies abd or back pain. No fever or chills.     Past Medical History  Diagnosis Date  . Hernia of unspecified site of abdominal cavity without mention of obstruction or gangrene   . Migraine headache   . Lumbosacral spondylosis without myelopathy   . Diverticulosis of colon (without mention of hemorrhage)   . Diverticulitis of colon (without mention of hemorrhage)   . Obesity   . Fatigue   . Female stress incontinence   . Essential hypertension, benign   . Hematuria   . Other and unspecified hyperlipidemia   . Benign neoplasm of colon   . Barrett's esophagus   . Stricture and stenosis of esophagus   . Blood transfusion without reported diagnosis   . GERD (gastroesophageal reflux disease)   . Chronic kidney disease   . Seizures   . Thyroid disease   . Pulmonary embolism    Past Surgical History  Procedure Laterality Date  . Abdominal hysterectomy  1990  . Back  surgery  1995    Fusion L4 - L5  . Colon surgery  1997    Removed  . Tubal ligation  1972  . Femur fracture surgery    . Hip fracture surgery    . Ulnar nerve repair    . Tendon transfer    . Humerus fracture surgery    . Femoral varus osteotomy w/ adductor release and iliac crest bone graft, pelvic osteotomy    . Partial colectomy      Secondary to diverticulitis   Family History  Problem Relation Age of Onset  . Alzheimer's disease Mother   . Alzheimer's disease Father    History  Substance Use Topics  . Smoking status: Never Smoker   . Smokeless tobacco: Never Used  . Alcohol Use: No   OB History   Grav Para Term Preterm Abortions TAB SAB Ect Mult Living                 Review of Systems  Constitutional: Negative for fever and chills.  HENT: Negative for sore throat.   Eyes: Negative for redness.  Respiratory: Positive for shortness of breath. Negative for cough.   Cardiovascular: Positive for chest pain. Negative for palpitations.  Gastrointestinal: Negative for nausea, vomiting, abdominal pain and diarrhea.  Genitourinary: Negative for flank pain.  Musculoskeletal: Negative for back pain and neck pain.  Skin: Negative for rash.  Neurological: Negative for headaches.  Hematological:  Does not bruise/bleed easily.  Psychiatric/Behavioral: Negative for confusion.      Allergies  Ambien; Diovan; Wellbutrin; Codeine; Feldene; Guaifenesin er; Latex; Lipitor; Neurontin; Prednisolone; Zocor; and Livalo  Home Medications   Prior to Admission medications   Medication Sig Start Date End Date Taking? Authorizing Provider  calcium-vitamin D (OSCAL WITH D) 500-200 MG-UNIT per tablet Take 1 tablet by mouth daily.    Historical Provider, MD  hydrochlorothiazide (HYDRODIURIL) 25 MG tablet TAKE 1 TABLET BY MOUTH ONCE A DAY 03/18/13   Chipper Herb, MD  HYDROcodone-acetaminophen (NORCO/VICODIN) 5-325 MG per tablet Take 1 tablet by mouth daily as needed for pain. 02/08/13    Chipper Herb, MD  metoprolol (LOPRESSOR) 50 MG tablet Take 50 mg by mouth 2 (two) times daily.  07/20/12   Chipper Herb, MD  metoprolol (LOPRESSOR) 50 MG tablet TAKE 1 TABLET BY MOUTH TWICE DAILY AS DIRECTED    Chipper Herb, MD  multivitamin-iron-minerals-folic acid (CENTRUM) chewable tablet Chew 1 tablet by mouth daily.      Historical Provider, MD  NEXIUM 40 MG capsule TAKE 1 CAPSULE BY MOUTH TWICE DAILY BEFORE BREAKFAST AND SUPPER 07/20/12   Chipper Herb, MD  SYNTHROID 100 MCG tablet TAKE 1/2 TABLET BY MOUTH EVERY DAY BEFORE BREAKFAST, EXCEPT ON [place] 1 TABLET    Chipper Herb, MD  VESICARE 10 MG tablet TAKE 1 TABLET BY MOUTH EVERY DAY    Chipper Herb, MD   BP 182/87  Pulse 69  Temp(Src) 97.6 F (36.4 C) (Oral)  Resp 11  SpO2 100% Physical Exam  Nursing note and vitals reviewed. Constitutional: She is oriented to person, place, and time. She appears well-developed and well-nourished. No distress.  HENT:  Mouth/Throat: Oropharynx is clear and moist.  Eyes: Conjunctivae are normal. No scleral icterus.  Neck: Normal range of motion. Neck supple. No JVD present. No tracheal deviation present.  Cardiovascular: Normal rate, regular rhythm, normal heart sounds and intact distal pulses.   Pulmonary/Chest: Effort normal and breath sounds normal. No respiratory distress. She exhibits no tenderness.  Abdominal: Soft. Normal appearance. She exhibits no distension and no mass. There is no tenderness. There is no rebound and no guarding.  Musculoskeletal: She exhibits no edema and no tenderness.  Neurological: She is alert and oriented to person, place, and time.  Skin: Skin is warm and dry. No rash noted. She is not diaphoretic.  Psychiatric: She has a normal mood and affect.    ED Course  Procedures (including critical care time) Labs Review    Results for orders placed during the hospital encounter of 09/04/13  PRO B NATRIURETIC PEPTIDE      Result Value Ref  Range   Pro B Natriuretic peptide (BNP) 194.0 (*) 0 - 125 pg/mL  CBC      Result Value Ref Range   WBC 10.8 (*) 4.0 - 10.5 K/uL   RBC 5.08  3.87 - 5.11 MIL/uL   Hemoglobin 14.8  12.0 - 15.0 g/dL   HCT 43.1  36.0 - 46.0 %   MCV 84.8  78.0 - 100.0 fL   MCH 29.1  26.0 - 34.0 pg   MCHC 34.3  30.0 - 36.0 g/dL   RDW 13.3  11.5 - 15.5 %   Platelets 216  150 - 400 K/uL  COMPREHENSIVE METABOLIC PANEL      Result Value Ref Range   Sodium 141  137 - 147 mEq/L   Potassium 3.7  3.7 -  5.3 mEq/L   Chloride 100  96 - 112 mEq/L   CO2 26  19 - 32 mEq/L   Glucose, Bld 108 (*) 70 - 99 mg/dL   BUN 28 (*) 6 - 23 mg/dL   Creatinine, Ser 1.16 (*) 0.50 - 1.10 mg/dL   Calcium 9.9  8.4 - 10.5 mg/dL   Total Protein 6.8  6.0 - 8.3 g/dL   Albumin 4.0  3.5 - 5.2 g/dL   AST 17  0 - 37 U/L   ALT 18  0 - 35 U/L   Alkaline Phosphatase 83  39 - 117 U/L   Total Bilirubin 0.3  0.3 - 1.2 mg/dL   GFR calc non Af Amer 49 (*) >90 mL/min   GFR calc Af Amer 56 (*) >90 mL/min  TROPONIN I      Result Value Ref Range   Troponin I <0.30  <0.30 ng/mL  LIPASE, BLOOD      Result Value Ref Range   Lipase 33  11 - 59 U/L   Dg Chest 2 View  09/04/2013   CLINICAL DATA:  Central chest pain for 3 hr  EXAM: CHEST  2 VIEW  COMPARISON:  None currently available  FINDINGS: Mild hypoaeration. There is streaky bilateral lung opacities favoring atelectasis or scar. The circumscribed density overlapping the anterior heart in the lateral projection which favors cardiac incisura. There is gaseous distension of the esophagus in the frontal projection, likely transient given finding not clearly seen laterally. Smooth pleural thickening at the left base, without evidence of free effusion in the lateral projection. Partially visualized plate and screw fixation of the left humerus.  IMPRESSION: Mild bilateral scarring or atelectasis.   Electronically Signed   By: Jorje Guild M.D.   On: 09/04/2013 22:43       EKG  Interpretation   Date/Time:  Saturday Sep 04 2013 21:23:24 EDT Ventricular Rate:  63 PR Interval:  180 QRS Duration: 78 QT Interval:  407 QTC Calculation: 417 R Axis:   16 Text Interpretation:  Sinus rhythm Nonspecific T wave abnormality  Confirmed by Ashok Cordia  MD, Lennette Bihari ([id number]) on 09/04/2013 9:30:18 PM      MDM  Iv ns. Labs. Ecg. Cxr.  Asa po.   Morphine iv, zofran iv, pepcid and gi cocktail for symptom relief.  Reviewed nursing notes and prior charts for additional history.   Recheck cp resolved.  Discussed w hospitalist - states tele, temp orders/obs.     Mirna Mires, MD 09/04/13 [id number]

## 2013-09-04 NOTE — ED Notes (Signed)
Pt's family is in consultation B. This RN updated family, and advised them that they can visit the pt, and only two visitors allowed at a time.

## 2013-09-05 LAB — TROPONIN I

## 2013-09-05 MED ORDER — ENOXAPARIN SODIUM 40 MG/0.4ML ~~LOC~~ SOLN
40.0000 mg | SUBCUTANEOUS | Status: DC
  Filled 2013-09-05: qty 0.4

## 2013-09-05 MED ORDER — MORPHINE SULFATE 2 MG/ML IJ SOLN
2.0000 mg | INTRAMUSCULAR | Status: DC | PRN
  Administered 2013-09-05: 2 mg via INTRAVENOUS
  Filled 2013-09-05: qty 1

## 2013-09-05 MED ORDER — HYDROCODONE-ACETAMINOPHEN 5-325 MG PO TABS: 1.0000 | ORAL_TABLET | Freq: Four times a day (QID) | ORAL | Status: DC | PRN

## 2013-09-05 MED ORDER — CALCIUM CARBONATE-VITAMIN D 500-200 MG-UNIT PO TABS
1.0000 | ORAL_TABLET | Freq: Every day | ORAL | Status: DC
Start: 2013-09-05 — End: 2013-09-05
  Filled 2013-09-05: qty 1

## 2013-09-05 MED ORDER — ASPIRIN EC 325 MG PO TBEC
325.0000 mg | DELAYED_RELEASE_TABLET | Freq: Every day | ORAL | Status: DC
  Filled 2013-09-05: qty 1

## 2013-09-05 MED ORDER — METOPROLOL TARTRATE 50 MG PO TABS
50.0000 mg | ORAL_TABLET | Freq: Two times a day (BID) | ORAL | Status: DC
  Filled 2013-09-05 (×3): qty 1

## 2013-09-05 MED ORDER — GI COCKTAIL ~~LOC~~
30.0000 mL | Freq: Four times a day (QID) | ORAL | Status: DC | PRN
Start: 2013-09-05 — End: 2013-09-05
  Filled 2013-09-05: qty 30

## 2013-09-05 NOTE — Discharge Summary (Signed)
Physician Discharge Summary  Selena Hunt DPO:[id number] DOB: 28-Feb-1951 DOA: 09/04/2013  PCP: Redge Gainer, MD  Admit date: 09/04/2013 Discharge date: 09/05/2013  Discharge Diagnoses:  Principal Problem:   Chest pain Active Problems:   Obesity   Fatigue, chronic   Hypertension   Hyperlipidemia   Discharge Condition: stable   Filed Weights   09/05/13 0004  Weight: 91.1 kg (200 lb 13.4 oz)    History of present illness:  63 yo female with h/o htn, hld, obesity comes in with several hours of sscp radiates to both arms and makes them feel numb. Associated sob, but no n/v. No fevers or cough. No swelling or edema in legs. The pain was sharp and lasted for several hours and also radiated into her epigastrum. She has h/o gerd but this was more severe than her regular gerd and was not relieved with her typical gi meds she usually takes. She did eat some horse raddish today, and the issue started soon after eating that. ems was called and she was given morphine, gi cocktail , asa, and ntg and her pain was relieved  Hospital Course:  Observed on telemetry. MI ruled out. No return of pain. Stable for discharge  Procedures:  none  Consultations:  none  Discharge Exam: Filed Vitals:   09/05/13 0400  BP: 131/80  Pulse: 52  Temp: 97.5 F (36.4 C)  Resp: 18    General: comfortable Cardiovascular: RRR Respiratory: CTA  Discharge Instructions You were cared for by a hospitalist during your hospital stay. If you have any questions about your discharge medications or the care you received while you were in the hospital after you are discharged, you can call the unit and asked to speak with the hospitalist on call if the hospitalist that took care of you is not available. Once you are discharged, your primary care physician will handle any further medical issues. Please note that NO REFILLS for any discharge medications will be authorized once you are discharged, as it is  imperative that you return to your primary care physician (or establish a relationship with a primary care physician if you do not have one) for your aftercare needs so that they can reassess your need for medications and monitor your lab values.  Discharge Instructions   Activity as tolerated - No restrictions    Complete by:  As directed      Diet - low sodium heart healthy    Complete by:  As directed             Medication List         calcium-vitamin D 500-200 MG-UNIT per tablet  Commonly known as:  OSCAL WITH D  Take 1 tablet by mouth daily.     hydrochlorothiazide 25 MG tablet  Commonly known as:  HYDRODIURIL  TAKE 1 TABLET BY MOUTH ONCE A DAY     HYDROcodone-acetaminophen 5-325 MG per tablet  Commonly known as:  NORCO/VICODIN  Take 1 tablet by mouth daily as needed for pain.     metoprolol 50 MG tablet  Commonly known as:  LOPRESSOR  Take 50 mg by mouth 2 (two) times daily.     multivitamin-iron-minerals-folic acid chewable tablet  Chew 1 tablet by mouth daily.     NEXIUM 40 MG capsule  Generic drug:  esomeprazole  TAKE 1 CAPSULE BY MOUTH TWICE DAILY BEFORE BREAKFAST AND SUPPER     SYNTHROID 100 MCG tablet  Generic drug:  levothyroxine  TAKE 1/2 TABLET  BY MOUTH EVERY DAY BEFORE BREAKFAST, EXCEPT ON MONDAY AND FRIDAY TAKE 1 TABLET     VESICARE 10 MG tablet  Generic drug:  solifenacin  TAKE 1 TABLET BY MOUTH EVERY DAY       Allergies  Allergen Reactions  . Ambien [Zolpidem Tartrate] Anxiety  . Diovan [Valsartan] Other (See Comments)    Seizures.  . Wellbutrin [Bupropion] Other (See Comments)    Seizures.  . Codeine   . Feldene [Piroxicam]   . Guaifenesin Er   . Latex   . Lipitor [Atorvastatin Calcium]   . Neurontin [Gabapentin]   . Prednisolone   . Zocor [Simvastatin]   . Livalo [Pitavastatin] Swelling       Follow-up Information   Follow up with Redge Gainer, MD. (as previously scheduled)    Specialty:  Family Medicine   Contact  information:   [place] [id number] [phone]        The results of significant diagnostics from this hospitalization (including imaging, microbiology, ancillary and laboratory) are listed below for reference.    Significant Diagnostic Studies: Dg Chest 2 View  09/04/2013   CLINICAL DATA:  Central chest pain for 3 hr  EXAM: CHEST  2 VIEW  COMPARISON:  None currently available  FINDINGS: Mild hypoaeration. There is streaky bilateral lung opacities favoring atelectasis or scar. The circumscribed density overlapping the anterior heart in the lateral projection which favors cardiac incisura. There is gaseous distension of the esophagus in the frontal projection, likely transient given finding not clearly seen laterally. Smooth pleural thickening at the left base, without evidence of free effusion in the lateral projection. Partially visualized plate and screw fixation of the left humerus.  IMPRESSION: Mild bilateral scarring or atelectasis.   Electronically Signed   By: Jorje Guild M.D.   On: 09/04/2013 22:43    Microbiology: No results found for this or any previous visit (from the past 240 hour(s)).   Labs: Basic Metabolic Panel:  Recent Labs Lab 09/04/13 2132  NA 141  K 3.7  CL 100  CO2 26  GLUCOSE 108*  BUN 28*  CREATININE 1.16*  CALCIUM 9.9   Liver Function Tests:  Recent Labs Lab 09/04/13 2132  AST 17  ALT 18  ALKPHOS 83  BILITOT 0.3  PROT 6.8  ALBUMIN 4.0    Recent Labs Lab 09/04/13 2132  LIPASE 33   No results found for this basename: AMMONIA,  in the last 168 hours CBC:  Recent Labs Lab 09/04/13 2132  WBC 10.8*  HGB 14.8  HCT 43.1  MCV 84.8  PLT 216   Cardiac Enzymes:  Recent Labs Lab 09/04/13 2132 09/05/13 0053 09/05/13 0636  TROPONINI <0.30 <0.30 <0.30   BNP: BNP (last 3 results)  Recent Labs  09/04/13 2132  PROBNP 194.0*   CBG: No results found for this basename: GLUCAP,  in the last 168  hours     Signed:  [facility] 09/05/2013, 10:00 AM

## 2013-09-05 NOTE — Progress Notes (Signed)
Utilization Review Completed.  

## 2013-09-05 NOTE — Progress Notes (Signed)
Pt d/c home.  A&Ox 4.  No c/o pain.  Education given on diet, activity, meds, and follow-up care and instructions.  Pt verbalized understanding.  IV D/C'd.

## 2013-09-08 ENCOUNTER — Other Ambulatory Visit: Payer: Medicare Other

## 2013-09-08 ENCOUNTER — Other Ambulatory Visit: Payer: Self-pay | Admitting: Family Medicine

## 2013-09-08 ENCOUNTER — Ambulatory Visit (INDEPENDENT_AMBULATORY_CARE_PROVIDER_SITE_OTHER): Payer: Medicare Other | Admitting: Family Medicine

## 2013-09-08 ENCOUNTER — Ambulatory Visit (INDEPENDENT_AMBULATORY_CARE_PROVIDER_SITE_OTHER): Payer: Medicare Other

## 2013-09-08 ENCOUNTER — Encounter: Payer: Self-pay | Admitting: Family Medicine

## 2013-09-08 VITALS — BP 107/74 | HR 63 | Temp 98.6°F | Ht 61.0 in | Wt 197.0 lb

## 2013-09-08 DIAGNOSIS — M545 Low back pain, unspecified: Secondary | ICD-10-CM

## 2013-09-08 DIAGNOSIS — R52 Pain, unspecified: Secondary | ICD-10-CM

## 2013-09-08 DIAGNOSIS — Z981 Arthrodesis status: Secondary | ICD-10-CM

## 2013-09-08 MED ORDER — CYCLOBENZAPRINE HCL 5 MG PO TABS: 5.0000 mg | ORAL_TABLET | Freq: Three times a day (TID) | ORAL | Status: DC | PRN

## 2013-09-08 MED ORDER — MELOXICAM 7.5 MG PO TABS: 7.5000 mg | ORAL_TABLET | Freq: Every day | ORAL | Status: DC

## 2013-09-08 NOTE — Progress Notes (Signed)
Subjective:    Patient ID: Selena Hunt, female    DOB: [birth year], 63 y.o.   MRN: [id number]  HPI Patient here today for back pain on right lower side and it radiates around to the right. The patient comes to the visit today because of this back pain. She indicates it started following an ambulance ride to the hospital for chest pain. Chest pain workup was negative and was thought to be due to GI irritation from prednisone. All of her cardiac enzymes were negative. 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 She has been doing fairly well with her back until that ride to the hospital recently. The pain is worse with moving to the right and it seems to run up and down her spine and in fact appears to be more proximal and radiating up to the actual surgery location. The patient was seen by [place] today because she was unable to get an appointment with her neurosurgeon.        Patient Active Problem List   Diagnosis Date Noted  . Chest pain 09/04/2013  . Metabolic syndrome 78/29/5621  . Migraine headache, history of   . Lumbosacral spondylosis without myelopathy   . Diverticulosis of colon (without mention of hemorrhage)   . Diverticulitis of colon (without mention of hemorrhage)   . Obesity   . Fatigue, chronic   . Female stress incontinence   . Hypertension   . Hematuria   . Hyperlipidemia   . Benign neoplasm of colon   . Stricture and stenosis of esophagus    Outpatient Encounter Prescriptions as of 09/08/2013  Medication Sig  . calcium-vitamin D (OSCAL WITH D) 500-200 MG-UNIT per tablet Take 1 tablet by mouth daily.  . hydrochlorothiazide (HYDRODIURIL) 25 MG tablet TAKE 1 TABLET BY MOUTH ONCE A DAY  . HYDROcodone-acetaminophen (NORCO/VICODIN) 5-325 MG per tablet Take 1 tablet by mouth daily as needed for pain.  . metoprolol (LOPRESSOR) 50 MG tablet Take 50 mg by mouth 2 (two) times daily.   .  multivitamin-iron-minerals-folic acid (CENTRUM) chewable tablet Chew 1 tablet by mouth daily.    Marland Kitchen NEXIUM 40 MG capsule TAKE 1 CAPSULE BY MOUTH TWICE DAILY BEFORE BREAKFAST AND SUPPER  . SYNTHROID 100 MCG tablet TAKE 1/2 TABLET BY MOUTH EVERY DAY BEFORE BREAKFAST, EXCEPT ON MONDAY AND FRIDAY TAKE 1 TABLET  . VESICARE 10 MG tablet TAKE 1 TABLET BY MOUTH EVERY DAY    Review of Systems  Constitutional: Negative.   HENT: Negative.   Eyes: Negative.   Respiratory: Negative.   Cardiovascular: Negative.   Gastrointestinal: Negative.   Endocrine: Negative.   Genitourinary: Negative.   Musculoskeletal: Positive for back pain.  Skin: Negative.   Allergic/Immunologic: Negative.   Neurological: Negative.   Hematological: Negative.   Psychiatric/Behavioral: Negative.        Objective:   Physical Exam  Nursing note and vitals reviewed. Constitutional: She is oriented to person, place, and time. She appears well-developed and well-nourished. No distress.  HENT:  Head: Normocephalic.  Eyes: Conjunctivae and EOM are normal. Pupils are equal, round, and reactive to light. Right eye exhibits no discharge. Left eye exhibits no discharge. No scleral icterus.  Neck: Normal range of motion. No thyromegaly present.  Cardiovascular: Exam reveals friction rub.   Pulmonary/Chest: Effort normal and breath sounds normal.  Abdominal: Soft. Bowel sounds are normal. She exhibits no mass. There is no tenderness. There is no rebound and no guarding.  Musculoskeletal: Normal range of motion. She exhibits no edema.  There is tenderness in the right flank area in the paralumbar area. Range of motion is hesitant with sitting and going supine  Neurological: She is alert and oriented to person, place, and time. She has normal reflexes.  Skin: Skin is warm and dry. No rash noted.  Psychiatric: She has a normal mood and affect. Her behavior is normal. Judgment and thought content normal.   BP 107/74  Pulse 63   Temp(Src) 98.6 F (37 C) (Oral)  Ht 5\' 1"  (1.549 m)  Wt 197 lb (89.359 kg)  BMI 37.24 kg/m2  [facility] reading (PRIMARY) by  Dr.Davidlee Jeanbaptiste-LS-spine--previous fusion noted, degenerative changes                                       Assessment & Plan:  1. Low back pain  2. History of spinal fusion  Patient Instructions  Take meds as directed, take the Metadate after breakfast and if it bothers her stomach please discontinue it Use warm wet compresses to back/painful area. Try and walk and exercise as tolerated If he did not get better with this conservative management and the problems continued, we will make an appointment for you to see the orthopedic surgeon for further followup We will call you with the radiologist in view of the x-rays that were done since those results are available   Arrie Senate MD

## 2013-09-08 NOTE — Patient Instructions (Addendum)
Take meds as directed, take the Metadate after breakfast and if it bothers her stomach please discontinue it Use warm wet compresses to back/painful area. Try and walk and exercise as tolerated If he did not get better with this conservative management and the problems continued, we will make an appointment for you to see the orthopedic surgeon for further followup We will call you with the radiologist in view of the x-rays that were done since those results are available

## 2013-09-09 ENCOUNTER — Ambulatory Visit: Payer: Medicare Other | Admitting: Family Medicine

## 2013-09-13 ENCOUNTER — Encounter: Payer: Self-pay | Admitting: Family Medicine

## 2013-09-13 ENCOUNTER — Ambulatory Visit (INDEPENDENT_AMBULATORY_CARE_PROVIDER_SITE_OTHER): Payer: Medicare Other | Admitting: Family Medicine

## 2013-09-13 VITALS — BP 111/78 | HR 82 | Temp 99.0°F | Ht 61.0 in | Wt 197.0 lb

## 2013-09-13 DIAGNOSIS — E785 Hyperlipidemia, unspecified: Secondary | ICD-10-CM

## 2013-09-13 DIAGNOSIS — M545 Low back pain, unspecified: Secondary | ICD-10-CM

## 2013-09-13 DIAGNOSIS — I10 Essential (primary) hypertension: Secondary | ICD-10-CM

## 2013-09-13 DIAGNOSIS — H9191 Unspecified hearing loss, right ear: Secondary | ICD-10-CM

## 2013-09-13 DIAGNOSIS — E8881 Metabolic syndrome: Secondary | ICD-10-CM

## 2013-09-13 DIAGNOSIS — M542 Cervicalgia: Secondary | ICD-10-CM

## 2013-09-13 DIAGNOSIS — H9190 Unspecified hearing loss, unspecified ear: Secondary | ICD-10-CM

## 2013-09-13 DIAGNOSIS — E559 Vitamin D deficiency, unspecified: Secondary | ICD-10-CM | POA: Insufficient documentation

## 2013-09-13 LAB — POCT CBC
Granulocyte percent: 69.8 %G (ref 37–80)
HEMATOCRIT: 44.7 % (ref 37.7–47.9)
HEMOGLOBIN: 14.7 g/dL (ref 12.2–16.2)
Lymph, poc: 1.5 (ref 0.6–3.4)
MCH, POC: 27.9 pg (ref 27–31.2)
MCHC: 32.9 g/dL (ref 31.8–35.4)
MCV: 84.8 fL (ref 80–97)
MPV: 9.4 fL (ref 0–99.8)
POC GRANULOCYTE: 4.5 (ref 2–6.9)
POC LYMPH %: 23.1 % (ref 10–50)
Platelet Count, POC: 189 10*3/uL (ref 142–424)
RBC: 5.3 M/uL (ref 4.04–5.48)
RDW, POC: 13.4 %
WBC: 6.5 10*3/uL (ref 4.6–10.2)

## 2013-09-13 LAB — POCT GLYCOSYLATED HEMOGLOBIN (HGB A1C): Hemoglobin A1C: 5.3

## 2013-09-13 NOTE — Patient Instructions (Signed)
We will call you with the results of the lab work as soon as those results are unavailable Followup is planned with the ear nose and throat specialist If you continue to have neck pain and radiation of pain please return to the office for C-spine films Always be careful and not put herself at risk for falling This summer, drink plenty of water Continue current medication

## 2013-09-13 NOTE — Progress Notes (Signed)
Subjective:    Patient ID: Selena Hunt, female    DOB: Mar 21, 1951, 63 y.o.   MRN: [id number]  HPI Pt here for follow up and management of chronic medical problems. The reset back pain that resulted from a trip in the ambulance to the emergency room has gotten some better. She was intolerant of the meloxicam she was given. She still has taken some of the muscle relaxer. She is still having some neck pain especially when she moves to the right side she has pain in her neck radiating up her left side of the head. She is better. She is due to receive lab work today.          Patient Active Problem List   Diagnosis Date Noted  . Chest pain 09/04/2013  . Metabolic syndrome 86/76/1952  . Migraine headache, history of   . Lumbosacral spondylosis without myelopathy   . Diverticulosis of colon (without mention of hemorrhage)   . Diverticulitis of colon (without mention of hemorrhage)   . Obesity   . Fatigue, chronic   . Female stress incontinence   . Hypertension   . Hematuria   . Hyperlipidemia   . Benign neoplasm of colon   . Stricture and stenosis of esophagus    Outpatient Encounter Prescriptions as of 09/13/2013  Medication Sig  . calcium-vitamin D (OSCAL WITH D) 500-200 MG-UNIT per tablet Take 1 tablet by mouth daily.  . cyclobenzaprine (FLEXERIL) 5 MG tablet Take 1 tablet (5 mg total) by mouth 3 (three) times daily as needed for muscle spasms.  . hydrochlorothiazide (HYDRODIURIL) 25 MG tablet TAKE 1 TABLET BY MOUTH ONCE A DAY  . HYDROcodone-acetaminophen (NORCO/VICODIN) 5-325 MG per tablet Take 1 tablet by mouth daily as needed for pain.  . metoprolol (LOPRESSOR) 50 MG tablet Take 50 mg by mouth 2 (two) times daily.   . multivitamin-iron-minerals-folic acid (CENTRUM) chewable tablet Chew 1 tablet by mouth daily.    Marland Kitchen NEXIUM 40 MG capsule TAKE 1 CAPSULE BY MOUTH TWICE DAILY BEFORE BREAKFAST AND SUPPER  . SYNTHROID 100 MCG tablet TAKE 1/2 TABLET BY MOUTH EVERY DAY BEFORE  BREAKFAST, EXCEPT ON MONDAY AND FRIDAY TAKE 1 TABLET  . VESICARE 10 MG tablet TAKE 1 TABLET BY MOUTH EVERY DAY  . [DISCONTINUED] meloxicam (MOBIC) 7.5 MG tablet Take 1 tablet (7.5 mg total) by mouth daily.    Review of Systems  Constitutional: Negative.   HENT: Negative.   Eyes: Negative.   Respiratory: Negative.   Cardiovascular: Negative.   Gastrointestinal: Negative.   Endocrine: Negative.   Genitourinary: Negative.   Musculoskeletal: Positive for arthralgias.  Skin: Negative.   Allergic/Immunologic: Negative.   Neurological: Negative.   Hematological: Negative.   Psychiatric/Behavioral: Negative.        Objective:   Physical Exam  Nursing note and vitals reviewed. Constitutional: She is oriented to person, place, and time. She appears well-developed and well-nourished. No distress.  HENT:  Head: Normocephalic and atraumatic.  Right Ear: External ear normal.  Left Ear: External ear normal.  Mouth/Throat: Oropharynx is clear and moist.  Nasal congestion right side with turbinate swelling  Eyes: Conjunctivae and EOM are normal. Pupils are equal, round, and reactive to light. Right eye exhibits no discharge. Left eye exhibits no discharge. No scleral icterus.  Neck: Normal range of motion. Neck supple. No thyromegaly present.  Cardiovascular: Normal rate, regular rhythm and normal heart sounds.  Exam reveals no gallop and no friction rub.   No murmur heard. At  84 per minute, decreased pedal pulses on the left side  Pulmonary/Chest: Effort normal and breath sounds normal. No respiratory distress. She has no wheezes. She has no rales. She exhibits no tenderness.  Abdominal: Soft. Bowel sounds are normal. She exhibits no mass. There is no tenderness. There is no rebound and no guarding.  Musculoskeletal: Normal range of motion. She exhibits no edema and no tenderness.  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  Neurological: She is alert and oriented to person, place, and time. She has normal reflexes. No cranial nerve deficit.  Skin: Skin is warm and dry. No rash noted.  Psychiatric: She has a normal mood and affect. Her behavior is normal. Judgment and thought content normal.   BP 111/78  Pulse 82  Temp(Src) 99 F (37.2 C) (Oral)  Ht 5' 1" (1.549 m)  Wt 197 lb (89.359 kg)  BMI 37.24 kg/m2        Assessment & Plan:  1. Hyperlipidemia - POCT CBC - NMR, lipoprofile  2. Hypertension - POCT CBC - BMP8+EGFR - Hepatic function panel  3. Metabolic syndrome - POCT CBC - POCT glycosylated hemoglobin (Hb A1C)  4. Vitamin D deficiency - Vit D  25 hydroxy (rtn osteoporosis monitoring)  5. Neck pain -If neck pain and neuropathy continue, return to clinic for C. spine films  6.  Low back pain improved  7. Decreased hearing of right ear  Patient Instructions  We will call you with the results of the lab work as soon as those results are unavailable Followup is planned with the ear nose and throat specialist If you continue to have neck pain and radiation of pain please return to the office for C-spine films Always be careful and not put herself at risk for falling This summer, drink plenty of water Continue current medication   Don W. Moore MD    

## 2013-09-14 LAB — BMP8+EGFR
BUN/Creatinine Ratio: 15 (ref 11–26)
BUN: 19 mg/dL (ref 8–27)
CO2: 25 mmol/L (ref 18–29)
Calcium: 9.4 mg/dL (ref 8.7–10.3)
Chloride: 105 mmol/L (ref 97–108)
Creatinine, Ser: 1.24 mg/dL — ABNORMAL HIGH (ref 0.57–1.00)
GFR, EST AFRICAN AMERICAN: 53 mL/min/{1.73_m2} — AB (ref >59)
GFR, EST NON AFRICAN AMERICAN: 46 mL/min/{1.73_m2} — AB (ref >59)
Glucose: 97 mg/dL (ref 65–99)
Potassium: 4 mmol/L (ref 3.5–5.2)
Sodium: 142 mmol/L (ref 134–144)

## 2013-09-14 LAB — NMR, LIPOPROFILE
CHOLESTEROL: 203 mg/dL — AB (ref 100–199)
HDL Cholesterol by NMR: 46 mg/dL (ref >39)
HDL Particle Number: 35.5 umol/L (ref >=30.5)
LDL Particle Number: 2061 nmol/L — ABNORMAL HIGH (ref <1000)
LDL SIZE: 20 nm (ref >20.5)
LDLC SERPL CALC-MCNC: 128 mg/dL — AB (ref 0–99)
LP-IR Score: 77 — ABNORMAL HIGH (ref <=45)
Small LDL Particle Number: 1428 nmol/L — ABNORMAL HIGH (ref <=527)
TRIGLYCERIDES BY NMR: 144 mg/dL (ref 0–149)

## 2013-09-14 LAB — HEPATIC FUNCTION PANEL
ALK PHOS: 94 IU/L (ref 39–117)
ALT: 21 IU/L (ref 0–32)
AST: 17 IU/L (ref 0–40)
Albumin: 4.4 g/dL (ref 3.6–4.8)
BILIRUBIN DIRECT: 0.1 mg/dL (ref 0.00–0.40)
TOTAL PROTEIN: 6 g/dL (ref 6.0–8.5)
Total Bilirubin: 0.3 mg/dL (ref 0.0–1.2)

## 2013-09-14 LAB — VITAMIN D 25 HYDROXY (VIT D DEFICIENCY, FRACTURES): Vit D, 25-Hydroxy: 26.2 ng/mL — ABNORMAL LOW (ref 30.0–100.0)

## 2013-09-15 ENCOUNTER — Telehealth: Payer: Self-pay | Admitting: Family Medicine

## 2013-09-15 MED ORDER — FLUTICASONE PROPIONATE 50 MCG/ACT NA SUSP: 2.0000 | Freq: Every day | NASAL | Status: DC

## 2013-09-15 NOTE — Telephone Encounter (Signed)
Done

## 2013-09-15 NOTE — Telephone Encounter (Signed)
Patient states you told her to call back if she wanted the Flonase RX and allergy med called in and she now wants it she said please send to her pharmacy. She does not know the name of the allergy med you said you would call in.

## 2013-09-29 ENCOUNTER — Encounter: Payer: Self-pay | Admitting: Internal Medicine

## 2013-10-11 ENCOUNTER — Other Ambulatory Visit (INDEPENDENT_AMBULATORY_CARE_PROVIDER_SITE_OTHER): Payer: Medicare Other

## 2013-10-11 DIAGNOSIS — I10 Essential (primary) hypertension: Secondary | ICD-10-CM

## 2013-10-11 NOTE — Progress Notes (Signed)
Pt came in for lab  only 

## 2013-10-12 LAB — BMP8+EGFR
BUN/Creatinine Ratio: 22 (ref 11–26)
BUN: 26 mg/dL (ref 8–27)
CO2: 27 mmol/L (ref 18–29)
CREATININE: 1.2 mg/dL — AB (ref 0.57–1.00)
Calcium: 9.8 mg/dL (ref 8.7–10.3)
Chloride: 101 mmol/L (ref 97–108)
GFR calc non Af Amer: 48 mL/min/{1.73_m2} — ABNORMAL LOW (ref >59)
GFR, EST AFRICAN AMERICAN: 55 mL/min/{1.73_m2} — AB (ref >59)
Glucose: 93 mg/dL (ref 65–99)
Potassium: 3.9 mmol/L (ref 3.5–5.2)
Sodium: 141 mmol/L (ref 134–144)

## 2013-10-21 ENCOUNTER — Other Ambulatory Visit: Payer: Self-pay | Admitting: Family Medicine

## 2013-11-22 ENCOUNTER — Other Ambulatory Visit: Payer: Self-pay | Admitting: Family Medicine

## 2013-12-20 ENCOUNTER — Other Ambulatory Visit: Payer: Self-pay | Admitting: Family Medicine

## 2014-01-14 ENCOUNTER — Encounter: Payer: Self-pay | Admitting: Family Medicine

## 2014-01-14 ENCOUNTER — Ambulatory Visit (INDEPENDENT_AMBULATORY_CARE_PROVIDER_SITE_OTHER): Payer: Medicare Other | Admitting: Family Medicine

## 2014-01-14 VITALS — BP 103/69 | HR 55 | Temp 98.0°F | Ht 61.0 in | Wt 197.0 lb

## 2014-01-14 DIAGNOSIS — Z23 Encounter for immunization: Secondary | ICD-10-CM

## 2014-01-14 DIAGNOSIS — I10 Essential (primary) hypertension: Secondary | ICD-10-CM | POA: Diagnosis not present

## 2014-01-14 DIAGNOSIS — E559 Vitamin D deficiency, unspecified: Secondary | ICD-10-CM | POA: Diagnosis not present

## 2014-01-14 DIAGNOSIS — E8881 Metabolic syndrome: Secondary | ICD-10-CM

## 2014-01-14 DIAGNOSIS — R143 Flatulence: Secondary | ICD-10-CM | POA: Diagnosis not present

## 2014-01-14 DIAGNOSIS — E785 Hyperlipidemia, unspecified: Secondary | ICD-10-CM | POA: Diagnosis not present

## 2014-01-14 NOTE — Patient Instructions (Addendum)
Medicare Annual Wellness Visit  [facility] and the medical providers at [facility] strive to bring you the best medical care.  In doing so we not only want to address your current medical conditions and concerns but also to detect new conditions early and prevent illness, disease and health-related problems.    Medicare offers a yearly Wellness Visit which allows our clinical staff to assess your need for preventative services including immunizations, lifestyle education, counseling to decrease risk of preventable diseases and screening for fall risk and other medical concerns.    This visit is provided free of charge (no copay) for all Medicare recipients. The clinical pharmacists at [facility] have begun to conduct these Wellness Visits which will also include a thorough review of all your medications.    As you primary medical provider recommend that you make an appointment for your Annual Wellness Visit if you have not done so already this year.  You may set up this appointment before you leave today or you may call back ([phone]) and schedule an appointment.  Please make sure when you call that you mention that you are scheduling your Annual Wellness Visit with the clinical pharmacist so that the appointment may be made for the proper length of time.      Continue current medications. Continue good therapeutic lifestyle changes which include good diet and exercise. Fall precautions discussed with patient. If an FOBT was given today- please return it to our front desk. If you are over 14 years old - you may need Prevnar 53 or the adult Pneumonia vaccine.  Flu Shots will be available at our office starting mid- September. Please call and schedule a FLU CLINIC APPOINTMENT.   You can purchase Align over-the-counter and take one daily as a probiotic. You must take this for at least a month for it to be effective. Watch your  milk cheese ice cream and dairy products and caffeine and see if this will help her stomach feel better.

## 2014-01-14 NOTE — Progress Notes (Signed)
Subjective:    Patient ID: Selena Hunt, female    DOB: Jan 12, 1951, 63 y.o.   MRN: [id number]  HPI Pt here for follow up and management of chronic medical problems. The patient is doing well with no specific complaints. She cannot take flu shots and Prevnar has been discussed. She will return to the office for fasting lab work. She will be given an FOBT to return in November. She is in good spirits today and has no specific complaints. She does have more gas and bloating. She reminded me that she cannot take prednisone anymore. She refuses the flu shot and will consider doing a Prevnar vaccine. She is going to return to the office for fasting lab work. She is questioning a specific test called a course CAD and we will find out what the specifics of this test are.       Patient Active Problem List   Diagnosis Date Noted  . Vitamin D deficiency 09/13/2013  . Chest pain 09/04/2013  . Metabolic syndrome 32/44/0102  . Migraine headache, history of   . Lumbosacral spondylosis without myelopathy   . Diverticulosis of colon (without mention of hemorrhage)   . Diverticulitis of colon (without mention of hemorrhage)   . Obesity   . Fatigue, chronic   . Female stress incontinence   . Hypertension   . Hematuria   . Hyperlipidemia   . Benign neoplasm of colon   . Stricture and stenosis of esophagus    Outpatient Encounter Prescriptions as of 01/14/2014  Medication Sig  . calcium-vitamin D (OSCAL WITH D) 500-200 MG-UNIT per tablet Take 1 tablet by mouth daily.  . fluticasone (FLONASE) 50 MCG/ACT nasal spray Place 2 sprays into both nostrils daily.  . hydrochlorothiazide (HYDRODIURIL) 25 MG tablet TAKE 1 TABLET BY MOUTH ONCE A DAY  . metoprolol (LOPRESSOR) 50 MG tablet TAKE 1 TABLET BY MOUTH TWICE DAILY AS DIRECTED  . multivitamin-iron-minerals-folic acid (CENTRUM) chewable tablet Chew 1 tablet by mouth daily.    Marland Kitchen NEXIUM 40 MG capsule TAKE 1 CAPSULE BY MOUTH TWICE DAILY BEFORE  BREAKFAST AND SUPPER  . SYNTHROID 100 MCG tablet TAKE 1/2 TABLET BY MOUTH EVERY DAY BEFORE BREAKFAST, EXCEPT ON MONDAY AND FRIDAY TAKE 1 TABLET  . VESICARE 10 MG tablet TAKE 1 TABLET BY MOUTH EVERY DAY  . [DISCONTINUED] cyclobenzaprine (FLEXERIL) 5 MG tablet Take 1 tablet (5 mg total) by mouth 3 (three) times daily as needed for muscle spasms.  . [DISCONTINUED] HYDROcodone-acetaminophen (NORCO/VICODIN) 5-325 MG per tablet Take 1 tablet by mouth daily as needed for pain.    Review of Systems  Constitutional: Negative.   HENT: Negative.   Eyes: Negative.   Respiratory: Negative.   Cardiovascular: Negative.   Gastrointestinal: Negative.   Endocrine: Negative.   Genitourinary: Negative.   Musculoskeletal: Negative.   Skin: Negative.   Allergic/Immunologic: Negative.   Neurological: Negative.   Hematological: Negative.   Psychiatric/Behavioral: Negative.        Objective:   Physical Exam  Nursing note and vitals reviewed. Constitutional: She is oriented to person, place, and time. She appears well-developed and well-nourished. No distress.  The patient is pleasant and in good spirits today with no specific complaints.  HENT:  Head: Normocephalic and atraumatic.  Right Ear: External ear normal.  Left Ear: External ear normal.  Mouth/Throat: Oropharynx is clear and moist.  Nasal congestion left greater than right  Eyes: Conjunctivae and EOM are normal. Pupils are equal, round, and reactive to light. Right  eye exhibits no discharge. Left eye exhibits no discharge. No scleral icterus.  Neck: Normal range of motion. Neck supple. No thyromegaly present.  No carotid bruits  Cardiovascular: Normal rate, regular rhythm, normal heart sounds and intact distal pulses.  Exam reveals no gallop and no friction rub.   No murmur heard. The rhythm is regular at 60 per minute  Pulmonary/Chest: Effort normal and breath sounds normal. No respiratory distress. She has no wheezes. She has no rales. She  exhibits no tenderness.  Abdominal: Soft. Bowel sounds are normal. She exhibits no mass. There is no tenderness. There is no rebound and no guarding.  There are no abdominal bruits. There is abdominal obesity without tenderness or masses  Musculoskeletal: Normal range of motion. She exhibits no edema and no tenderness.  Good mobility without edema  Lymphadenopathy:    She has no cervical adenopathy.  Neurological: She is alert and oriented to person, place, and time. She has normal reflexes. No cranial nerve deficit.  Skin: Skin is warm and dry. No rash noted.  Psychiatric: She has a normal mood and affect. Her behavior is normal. Judgment and thought content normal.   BP 103/69  Pulse 55  Temp(Src) 98 F (36.7 C) (Oral)  Ht $R'5\' 1"'LN$  (1.549 m)  Wt 197 lb (89.359 kg)  BMI 37.24 kg/m2        Assessment & Plan:  1. Hyperlipidemia - POCT CBC; Future - NMR, lipoprofile; Future  2. Metabolic syndrome - POCT CBC; Future  3. Hypertension - POCT CBC; Future - BMP8+EGFR; Future - Hepatic function panel; Future  4. Vitamin D deficiency - POCT CBC; Future - Vit D  25 hydroxy (rtn osteoporosis monitoring); Future  5. Flatulence  Patient Instructions                       Medicare Annual Wellness Visit  [facility] and the medical providers at [facility] strive to bring you the best medical care.  In doing so we not only want to address your current medical conditions and concerns but also to detect new conditions early and prevent illness, disease and health-related problems.    Medicare offers a yearly Wellness Visit which allows our clinical staff to assess your need for preventative services including immunizations, lifestyle education, counseling to decrease risk of preventable diseases and screening for fall risk and other medical concerns.    This visit is provided free of charge (no copay) for all Medicare recipients. The clinical pharmacists at [facility] have begun to conduct these Wellness Visits which will also include a thorough review of all your medications.    As you primary medical provider recommend that you make an appointment for your Annual Wellness Visit if you have not done so already this year.  You may set up this appointment before you leave today or you may call back ([phone]) and schedule an appointment.  Please make sure when you call that you mention that you are scheduling your Annual Wellness Visit with the clinical pharmacist so that the appointment may be made for the proper length of time.      Continue current medications. Continue good therapeutic lifestyle changes which include good diet and exercise. Fall precautions discussed with patient. If an FOBT was given today- please return it to our front desk. If you are over 58 years old - you may need Prevnar 59 or the adult Pneumonia vaccine.  Flu Shots will  be available at our office starting mid- September. Please call and schedule a FLU CLINIC APPOINTMENT.   You can purchase Align over-the-counter and take one daily as a probiotic. You must take this for at least a month for it to be effective. Watch your milk cheese ice cream and dairy products and caffeine and see if this will help her stomach feel better.   Arrie Senate MD

## 2014-01-14 NOTE — Addendum Note (Signed)
Addended by: Zannie Cove on: 01/14/2014 11:50 AM   Modules accepted: Orders

## 2014-01-19 ENCOUNTER — Other Ambulatory Visit: Payer: Self-pay | Admitting: Family Medicine

## 2014-01-21 NOTE — Telephone Encounter (Signed)
Do not see a recent thyroid level. Please advise on refill

## 2014-01-21 NOTE — Telephone Encounter (Signed)
Please make sure that the patient gets a thyroid profile at sometime in the near future.It is okay to refill this for 6 months

## 2014-02-21 ENCOUNTER — Other Ambulatory Visit: Payer: Self-pay | Admitting: Family Medicine

## 2014-03-15 DIAGNOSIS — H9041 Sensorineural hearing loss, unilateral, right ear, with unrestricted hearing on the contralateral side: Secondary | ICD-10-CM | POA: Diagnosis not present

## 2014-03-15 DIAGNOSIS — H9311 Tinnitus, right ear: Secondary | ICD-10-CM | POA: Diagnosis not present

## 2014-03-15 DIAGNOSIS — H9121 Sudden idiopathic hearing loss, right ear: Secondary | ICD-10-CM | POA: Diagnosis not present

## 2014-03-22 ENCOUNTER — Other Ambulatory Visit: Payer: Self-pay | Admitting: Family Medicine

## 2014-03-23 ENCOUNTER — Other Ambulatory Visit: Payer: Self-pay | Admitting: Family Medicine

## 2014-03-24 DIAGNOSIS — H9041 Sensorineural hearing loss, unilateral, right ear, with unrestricted hearing on the contralateral side: Secondary | ICD-10-CM | POA: Diagnosis not present

## 2014-03-24 DIAGNOSIS — H9311 Tinnitus, right ear: Secondary | ICD-10-CM | POA: Diagnosis not present

## 2014-05-02 ENCOUNTER — Encounter: Payer: Self-pay | Admitting: Family Medicine

## 2014-05-02 ENCOUNTER — Ambulatory Visit (INDEPENDENT_AMBULATORY_CARE_PROVIDER_SITE_OTHER): Payer: Medicare Other | Admitting: Family Medicine

## 2014-05-02 VITALS — BP 127/70 | HR 64 | Temp 98.6°F | Ht 61.0 in | Wt 196.0 lb

## 2014-05-02 DIAGNOSIS — H65193 Other acute nonsuppurative otitis media, bilateral: Secondary | ICD-10-CM

## 2014-05-02 DIAGNOSIS — J0100 Acute maxillary sinusitis, unspecified: Secondary | ICD-10-CM

## 2014-05-02 DIAGNOSIS — J209 Acute bronchitis, unspecified: Secondary | ICD-10-CM | POA: Diagnosis not present

## 2014-05-02 MED ORDER — HYDROCODONE-HOMATROPINE 5-1.5 MG/5ML PO SYRP: 5.0000 mL | ORAL_SOLUTION | ORAL | Status: DC | PRN

## 2014-05-02 MED ORDER — LEVOFLOXACIN 500 MG PO TABS: 500.0000 mg | ORAL_TABLET | Freq: Every day | ORAL | Status: DC

## 2014-05-02 NOTE — Progress Notes (Signed)
Subjective:    Patient ID: Alanson Puls, female    DOB: March 12, 1951, 64 y.o.   MRN: [id number]  HPI  63 year old female comes in today with complaint of a moderate cough x 1 week. She developed some right ear discomfort this morning. Her grandson has RSV. She has been exposed to him continuously. Cough has been productive of copious green sputum. It has caused her mild shortness of breath. She also has some pain in the cheeks and nasal congestion as well as some postnasal drainage. She denies fever chills and sweats.  Patient Active Problem List   Diagnosis Date Noted  . Vitamin D deficiency 09/13/2013  . Chest pain 09/04/2013  . Metabolic syndrome 36/14/4315  . Migraine headache, history of   . Lumbosacral spondylosis without myelopathy   . Diverticulitis of colon (without mention of hemorrhage)   . Obesity   . Fatigue, chronic   . Female stress incontinence   . Hypertension   . Hematuria   . Hyperlipidemia   . Benign neoplasm of colon   . Stricture and stenosis of esophagus    Outpatient Encounter Prescriptions as of 05/02/2014  Medication Sig  . calcium-vitamin D (OSCAL WITH D) 500-200 MG-UNIT per tablet Take 1 tablet by mouth daily.  . fluticasone (FLONASE) 50 MCG/ACT nasal spray Place 2 sprays into both nostrils daily.  . hydrochlorothiazide (HYDRODIURIL) 25 MG tablet TAKE 1 CAPSULE BY MOUTH EVERY DAY  . metoprolol (LOPRESSOR) 50 MG tablet TAKE 1 TABLET BY MOUTH TWICE DAILY AS DIRECTED  . multivitamin-iron-minerals-folic acid (CENTRUM) chewable tablet Chew 1 tablet by mouth daily.    Marland Kitchen NEXIUM 40 MG capsule TAKE 1 CAPSULE BY MOUTH TWICE DAILY BEFORE BREAKFAST AND SUPPER  . SYNTHROID 100 MCG tablet TAKE 1/2 OF A TABLET BY MOUTH EVERY DAY BEFORE BREAKFAST EXCEPT ON MONDAYS AND FRIDAYS, THEN TAKE 1 TABLET BY MOUTH BEFORE BREAKFAST  . VESICARE 10 MG tablet TAKE 1 TABLET BY MOUTH EVERY DAY      Review of Systems  Constitutional: Negative for fever, chills, diaphoresis,  appetite change, fatigue and unexpected weight change.  HENT: Negative for congestion, ear pain, hearing loss, postnasal drip, rhinorrhea, sneezing, sore throat and trouble swallowing.   Eyes: Negative for pain.  Respiratory: Positive for cough, chest tightness, shortness of breath and wheezing.   Cardiovascular: Negative for chest pain and palpitations.  Gastrointestinal: Negative for nausea, vomiting, abdominal pain, diarrhea and constipation.  Genitourinary: Negative for dysuria, frequency and menstrual problem.  Musculoskeletal: Negative for joint swelling and arthralgias.  Skin: Negative for rash.  Neurological: Negative for dizziness, weakness, numbness and headaches.  Psychiatric/Behavioral: Negative for dysphoric mood and agitation.       Objective:   Physical Exam  Constitutional: She is oriented to person, place, and time. She appears well-developed and well-nourished. No distress.  HENT:  Head: Normocephalic and atraumatic.  Right Ear: External ear normal.  Left Ear: External ear normal.  Nose: Nose normal.  Mouth/Throat: Oropharynx is clear and moist.  Eyes: Conjunctivae and EOM are normal. Pupils are equal, round, and reactive to light.  Neck: Normal range of motion. Neck supple. No thyromegaly present.  Cardiovascular: Normal rate, regular rhythm and normal heart sounds.   No murmur heard. Pulmonary/Chest: Effort normal. No respiratory distress. She has wheezes (scattered ). She has no rales.  Abdominal: Soft. Bowel sounds are normal. She exhibits no distension. There is no tenderness.  Lymphadenopathy:    She has no cervical adenopathy.  Neurological: She  is alert and oriented to person, place, and time. She has normal reflexes.  Skin: Skin is warm and dry.  Psychiatric: She has a normal mood and affect. Her behavior is normal. Judgment and thought content normal.     BP 127/70 mmHg  Pulse 64  Temp(Src) 98.6 F (37 C) (Oral)  Ht 5\' 1"  (1.549 m)  Wt 196 lb  (88.905 kg)  BMI 37.05 kg/m2  SpO2 94%      Assessment & Plan:   1. Acute nonsuppurative otitis media of both ears   2. Acute maxillary sinusitis, recurrence not specified   3. Acute bronchitis, unspecified organism     Meds ordered this encounter  Medications  . levofloxacin (LEVAQUIN) 500 MG tablet    Sig: Take 1 tablet (500 mg total) by mouth daily.    Dispense:  10 tablet    Refill:  0  . HYDROcodone-homatropine (HYCODAN) 5-1.5 MG/5ML syrup    Sig: Take 5 mLs by mouth every 4 (four) hours as needed for cough.    Dispense:  120 mL    Refill:  0    No orders of the defined types were placed in this encounter.    Labs pending Health Maintenance Diet and exercise encouraged Continue all meds as discussed Follow up in 3 months  Claretta Fraise, MD

## 2014-05-03 ENCOUNTER — Encounter: Payer: Self-pay | Admitting: Family Medicine

## 2014-05-21 ENCOUNTER — Other Ambulatory Visit: Payer: Self-pay | Admitting: Family Medicine

## 2014-05-24 ENCOUNTER — Telehealth: Payer: Self-pay | Admitting: Family Medicine

## 2014-05-24 NOTE — Telephone Encounter (Signed)
Pharm aware.  

## 2014-05-25 NOTE — Telephone Encounter (Signed)
Already taken care of. Pharmacy notified

## 2014-05-31 ENCOUNTER — Encounter: Payer: Self-pay | Admitting: Family Medicine

## 2014-05-31 ENCOUNTER — Ambulatory Visit (INDEPENDENT_AMBULATORY_CARE_PROVIDER_SITE_OTHER): Payer: Medicare Other | Admitting: Family Medicine

## 2014-05-31 VITALS — BP 115/77 | HR 60 | Temp 97.9°F | Ht 61.0 in | Wt 196.0 lb

## 2014-05-31 DIAGNOSIS — E8881 Metabolic syndrome: Secondary | ICD-10-CM | POA: Diagnosis not present

## 2014-05-31 DIAGNOSIS — E039 Hypothyroidism, unspecified: Secondary | ICD-10-CM

## 2014-05-31 DIAGNOSIS — K2270 Barrett's esophagus without dysplasia: Secondary | ICD-10-CM

## 2014-05-31 DIAGNOSIS — M2550 Pain in unspecified joint: Secondary | ICD-10-CM

## 2014-05-31 DIAGNOSIS — E785 Hyperlipidemia, unspecified: Secondary | ICD-10-CM

## 2014-05-31 DIAGNOSIS — E559 Vitamin D deficiency, unspecified: Secondary | ICD-10-CM

## 2014-05-31 DIAGNOSIS — I10 Essential (primary) hypertension: Secondary | ICD-10-CM | POA: Diagnosis not present

## 2014-05-31 LAB — POCT CBC
Granulocyte percent: 68.2 %G (ref 37–80)
HCT, POC: 46.5 % (ref 37.7–47.9)
Hemoglobin: 14.3 g/dL (ref 12.2–16.2)
LYMPH, POC: 1.6 (ref 0.6–3.4)
MCH, POC: 26.3 pg — AB (ref 27–31.2)
MCHC: 30.9 g/dL — AB (ref 31.8–35.4)
MCV: 85.1 fL (ref 80–97)
MPV: 9.3 fL (ref 0–99.8)
POC Granulocyte: 4.1 (ref 2–6.9)
POC LYMPH %: 26.5 % (ref 10–50)
Platelet Count, POC: 202 10*3/uL (ref 142–424)
RBC: 5.46 M/uL (ref 4.04–5.48)
RDW, POC: 13.2 %
WBC: 6 10*3/uL (ref 4.6–10.2)

## 2014-05-31 MED ORDER — OXYBUTYNIN CHLORIDE ER 10 MG PO TB24: 10.0000 mg | ORAL_TABLET | Freq: Every day | ORAL | Status: DC

## 2014-05-31 NOTE — Patient Instructions (Addendum)
Medicare Annual Wellness Visit  [facility] and the medical providers at [facility] strive to bring you the best medical care.  In doing so we not only want to address your current medical conditions and concerns but also to detect new conditions early and prevent illness, disease and health-related problems.    Medicare offers a yearly Wellness Visit which allows our clinical staff to assess your need for preventative services including immunizations, lifestyle education, counseling to decrease risk of preventable diseases and screening for fall risk and other medical concerns.    This visit is provided free of charge (no copay) for all Medicare recipients. The clinical pharmacists at [facility] have begun to conduct these Wellness Visits which will also include a thorough review of all your medications.    As you primary medical provider recommend that you make an appointment for your Annual Wellness Visit if you have not done so already this year.  You may set up this appointment before you leave today or you may call back ([phone]) and schedule an appointment.  Please make sure when you call that you mention that you are scheduling your Annual Wellness Visit with the clinical pharmacist so that the appointment may be made for the proper length of time.     Continue current medications. Continue good therapeutic lifestyle changes which include good diet and exercise. Fall precautions discussed with patient. If an FOBT was given today- please return it to our front desk. If you are over 63 years old - you may need Prevnar 55 or the adult Pneumonia vaccine.  Flu Shots are still available at our office. If you still haven't had one please call to set up a nurse visit to get one.   After your visit with [place] today you will receive a survey in the mail or online from [employer] regarding your care with [place]. Please take a moment to  fill this out. Your feedback is very important to [place] as you can help [place] better understand your patient needs as well as improve your experience and satisfaction. WE CARE ABOUT YOU!!!   Continue to take Tylenol for aches pains and fever Continue to drink plenty of fluids Continue to use nasal saline for nasal congestion

## 2014-05-31 NOTE — Progress Notes (Signed)
Subjective:    Patient ID: Selena Hunt, female    DOB: 1950/04/17, 64 y.o.   MRN: [id number]  HPI Pt here for follow up and management of chronic medical problems which includes hypothyroid, hypertension and hyperlipidemia. She is taking medications regularly. The patient took a course of Levaquin for rhinosinusitis and has had some joint pain since that period she also complains of some right upper arm pain and is concerned about because of taking Vesicare. She is due to return an FOBT and will return to the office for fasting lab work.           Patient Active Problem List   Diagnosis Date Noted  . Vitamin D deficiency 09/13/2013  . Chest pain 09/04/2013  . Metabolic syndrome 49/82/6415  . Migraine headache, history of   . Lumbosacral spondylosis without myelopathy   . Diverticulitis of colon (without mention of hemorrhage)   . Obesity   . Fatigue, chronic   . Female stress incontinence   . Hypertension   . Hematuria   . Hyperlipidemia   . Benign neoplasm of colon   . Stricture and stenosis of esophagus    Outpatient Encounter Prescriptions as of 05/31/2014  Medication Sig  . calcium-vitamin D (OSCAL WITH D) 500-200 MG-UNIT per tablet Take 1 tablet by mouth daily.  . fluticasone (FLONASE) 50 MCG/ACT nasal spray Place 2 sprays into both nostrils daily.  . hydrochlorothiazide (HYDRODIURIL) 25 MG tablet TAKE 1 CAPSULE BY MOUTH EVERY DAY  . metoprolol (LOPRESSOR) 50 MG tablet TAKE 1 TABLET BY MOUTH TWICE DAILY AS DIRECTED (Patient taking differently: TAKE 1 TABLET BY MOUTH DAILY)  . multivitamin-iron-minerals-folic acid (CENTRUM) chewable tablet Chew 1 tablet by mouth daily.    Marland Kitchen NEXIUM 40 MG capsule TAKE 1 CAPSULE BY MOUTH TWICE DAILY BEFORE BREAKFAST AND SUPPER  . SYNTHROID 100 MCG tablet TAKE 1/2 OF A TABLET BY MOUTH EVERY DAY BEFORE BREAKFAST EXCEPT ON MONDAYS AND FRIDAYS, THEN TAKE 1 TABLET BY MOUTH BEFORE BREAKFAST  . VESICARE 10 MG tablet TAKE 1 TABLET BY MOUTH  EVERY DAY  . [DISCONTINUED] HYDROcodone-homatropine (HYCODAN) 5-1.5 MG/5ML syrup Take 5 mLs by mouth every 4 (four) hours as needed for cough.  . [DISCONTINUED] levofloxacin (LEVAQUIN) 500 MG tablet Take 1 tablet (500 mg total) by mouth daily.    Review of Systems  Constitutional: Negative.   HENT: Negative.   Eyes: Negative.   Respiratory: Negative.   Cardiovascular: Negative.   Gastrointestinal: Negative.   Endocrine: Negative.   Genitourinary: Negative.   Musculoskeletal: Positive for arthralgias.  Skin: Negative.   Allergic/Immunologic: Negative.   Neurological: Negative.   Hematological: Negative.   Psychiatric/Behavioral: Negative.        Objective:   Physical Exam  Constitutional: She is oriented to person, place, and time. She appears well-developed and well-nourished. No distress.  HENT:  Head: Normocephalic and atraumatic.  Right Ear: External ear normal.  Left Ear: External ear normal.  Mouth/Throat: Oropharynx is clear and moist. No oropharyngeal exudate.  There is some nasal congestion bilaterally left greater than right  Eyes: Conjunctivae and EOM are normal. Pupils are equal, round, and reactive to light. Right eye exhibits no discharge. Left eye exhibits no discharge. No scleral icterus.  Neck: Normal range of motion. Neck supple. No thyromegaly present.  No anterior cervical nodes or carotid bruits  Cardiovascular: Normal rate, regular rhythm, normal heart sounds and intact distal pulses.   No murmur heard. The heart has a regular rate and rhythm  at 60/m  Pulmonary/Chest: Effort normal and breath sounds normal. No respiratory distress. She has no wheezes. She has no rales. She exhibits no tenderness.  Lungs are clear anteriorly and posteriorly  Abdominal: Soft. Bowel sounds are normal. She exhibits no mass. There is no tenderness. There is no rebound and no guarding.  The abdomen is nontender without masses  Musculoskeletal: Normal range of motion. She  exhibits tenderness. She exhibits no edema.  Multiple joints are tender to palpation.  Lymphadenopathy:    She has no cervical adenopathy.  Neurological: She is alert and oriented to person, place, and time. She has normal reflexes.  Skin: Skin is warm and dry. No rash noted.  Psychiatric: She has a normal mood and affect. Her behavior is normal. Judgment and thought content normal.  Nursing note and vitals reviewed.  BP 115/77 mmHg  Pulse 60  Temp(Src) 97.9 F (36.6 C) (Oral)  Ht 5' 1" (1.549 m)  Wt 196 lb (88.905 kg)  BMI 37.05 kg/m2        Assessment & Plan:  1. Hyperlipidemia -Return to the clinic for fasting lab work and in the meantime continue with aggressive therapeutic lifestyle changes - POCT CBC; Future - NMR, lipoprofile; Future  2. Metabolic syndrome -Continue with weight loss and exercise as tolerated and diet. - POCT CBC; Future - BMP8+EGFR; Future  3. Hypertension -Blood pressure is good today, continue with current treatment. - POCT CBC; Future - BMP8+EGFR; Future - Hepatic function panel; Future  4. Vitamin D deficiency -The vitamin D level will be determined by the blood work and we will let you know if we need to take any additional medicine than you're already taking. - POCT CBC; Future - Vit D  25 hydroxy (rtn osteoporosis monitoring); Future  5. Barrett's esophagus -Follow up with gastroenterology as planned - POCT CBC; Future  6. Hypothyroidism, unspecified hypothyroidism type -Continue current dose of Synthroid and any adjustments in this dosage will be determined by lab work. - POCT CBC; Future  7. Arthralgia -For now, take Tylenol and drink lots of fluids and stay as active as possible - POCT CBC - Sedimentation rate  Patient Instructions                       Medicare Annual Wellness Visit  [facility] and the medical providers at [facility] strive to bring you the best medical care.  In doing so we not  only want to address your current medical conditions and concerns but also to detect new conditions early and prevent illness, disease and health-related problems.    Medicare offers a yearly Wellness Visit which allows our clinical staff to assess your need for preventative services including immunizations, lifestyle education, counseling to decrease risk of preventable diseases and screening for fall risk and other medical concerns.    This visit is provided free of charge (no copay) for all Medicare recipients. The clinical pharmacists at [facility] have begun to conduct these Wellness Visits which will also include a thorough review of all your medications.    As you primary medical provider recommend that you make an appointment for your Annual Wellness Visit if you have not done so already this year.  You may set up this appointment before you leave today or you may call back ([phone]) and schedule an appointment.  Please make sure when you call that you mention that you are scheduling your Annual Wellness Visit with  the clinical pharmacist so that the appointment may be made for the proper length of time.     Continue current medications. Continue good therapeutic lifestyle changes which include good diet and exercise. Fall precautions discussed with patient. If an FOBT was given today- please return it to our front desk. If you are over 62 years old - you may need Prevnar 57 or the adult Pneumonia vaccine.  Flu Shots are still available at our office. If you still haven't had one please call to set up a nurse visit to get one.   After your visit with [place] today you will receive a survey in the mail or online from [employer] regarding your care with [place]. Please take a moment to fill this out. Your feedback is very important to [place] as you can help [place] better understand your patient needs as well as improve your experience and satisfaction. WE CARE ABOUT YOU!!!   Continue  to take Tylenol for aches pains and fever Continue to drink plenty of fluids Continue to use nasal saline for nasal congestion   Arrie Senate MD

## 2014-06-01 LAB — SEDIMENTATION RATE: SED RATE: 5 mm/h (ref 0–40)

## 2014-07-12 ENCOUNTER — Telehealth: Payer: Self-pay | Admitting: Family Medicine

## 2014-07-12 NOTE — Telephone Encounter (Signed)
Orders still in from Feb 2016.  Pt aware can come

## 2014-07-15 ENCOUNTER — Encounter (INDEPENDENT_AMBULATORY_CARE_PROVIDER_SITE_OTHER): Payer: Self-pay

## 2014-07-15 ENCOUNTER — Other Ambulatory Visit (INDEPENDENT_AMBULATORY_CARE_PROVIDER_SITE_OTHER): Payer: Medicare Other

## 2014-07-15 DIAGNOSIS — I10 Essential (primary) hypertension: Secondary | ICD-10-CM | POA: Diagnosis not present

## 2014-07-15 DIAGNOSIS — E785 Hyperlipidemia, unspecified: Secondary | ICD-10-CM

## 2014-07-15 DIAGNOSIS — E559 Vitamin D deficiency, unspecified: Secondary | ICD-10-CM | POA: Diagnosis not present

## 2014-07-15 LAB — POCT CBC
GRANULOCYTE PERCENT: 58.9 % (ref 37–80)
HCT, POC: 45.4 % (ref 37.7–47.9)
Hemoglobin: 14.5 g/dL (ref 12.2–16.2)
LYMPH, POC: 2.1 (ref 0.6–3.4)
MCH, POC: 27.3 pg (ref 27–31.2)
MCHC: 32 g/dL (ref 31.8–35.4)
MCV: 85.3 fL (ref 80–97)
MPV: 9.9 fL (ref 0–99.8)
POC Granulocyte: 3.4 (ref 2–6.9)
POC LYMPH PERCENT: 35.6 %L (ref 10–50)
Platelet Count, POC: 197 10*3/uL (ref 142–424)
RBC: 5.31 M/uL (ref 4.04–5.48)
RDW, POC: 14.1 %
WBC: 5.8 10*3/uL (ref 4.6–10.2)

## 2014-07-15 NOTE — Addendum Note (Signed)
Addended by: Selmer Dominion on: 07/15/2014 02:19 PM   Modules accepted: Orders

## 2014-07-15 NOTE — Progress Notes (Signed)
Lab only 

## 2014-07-16 LAB — BMP8+EGFR
BUN / CREAT RATIO: 23 (ref 11–26)
BUN: 25 mg/dL (ref 8–27)
CO2: 27 mmol/L (ref 18–29)
CREATININE: 1.11 mg/dL — AB (ref 0.57–1.00)
Calcium: 9.5 mg/dL (ref 8.7–10.3)
Chloride: 101 mmol/L (ref 97–108)
GFR calc non Af Amer: 52 mL/min/{1.73_m2} — ABNORMAL LOW (ref >59)
GFR, EST AFRICAN AMERICAN: 60 mL/min/{1.73_m2} (ref >59)
GLUCOSE: 80 mg/dL (ref 65–99)
Potassium: 3.3 mmol/L — ABNORMAL LOW (ref 3.5–5.2)
SODIUM: 143 mmol/L (ref 134–144)

## 2014-07-16 LAB — NMR, LIPOPROFILE
CHOLESTEROL: 227 mg/dL — AB (ref 100–199)
HDL CHOLESTEROL BY NMR: 45 mg/dL (ref >39)
HDL Particle Number: 31 umol/L (ref >=30.5)
LDL PARTICLE NUMBER: 1829 nmol/L — AB (ref <1000)
LDL Size: 21.2 nm (ref >20.5)
LDL-C: 137 mg/dL — AB (ref 0–99)
LP-IR SCORE: 84 — AB (ref <=45)
Small LDL Particle Number: 517 nmol/L (ref <=527)
TRIGLYCERIDES BY NMR: 224 mg/dL — AB (ref 0–149)

## 2014-07-16 LAB — HEPATIC FUNCTION PANEL
ALBUMIN: 4 g/dL (ref 3.6–4.8)
ALK PHOS: 88 IU/L (ref 39–117)
ALT: 18 IU/L (ref 0–32)
AST: 14 IU/L (ref 0–40)
Bilirubin Total: 0.3 mg/dL (ref 0.0–1.2)
Bilirubin, Direct: 0.09 mg/dL (ref 0.00–0.40)
Total Protein: 6 g/dL (ref 6.0–8.5)

## 2014-07-16 LAB — VITAMIN D 25 HYDROXY (VIT D DEFICIENCY, FRACTURES): VIT D 25 HYDROXY: 18 ng/mL — AB (ref 30.0–100.0)

## 2014-07-18 ENCOUNTER — Telehealth: Payer: Self-pay | Admitting: Family Medicine

## 2014-07-18 NOTE — Telephone Encounter (Signed)
Pt aware of lab results, note under labs

## 2014-07-19 ENCOUNTER — Other Ambulatory Visit: Payer: Self-pay | Admitting: Family Medicine

## 2014-07-19 DIAGNOSIS — H9042 Sensorineural hearing loss, unilateral, left ear, with unrestricted hearing on the contralateral side: Secondary | ICD-10-CM | POA: Diagnosis not present

## 2014-07-19 DIAGNOSIS — H903 Sensorineural hearing loss, bilateral: Secondary | ICD-10-CM | POA: Diagnosis not present

## 2014-07-19 DIAGNOSIS — H9041 Sensorineural hearing loss, unilateral, right ear, with unrestricted hearing on the contralateral side: Secondary | ICD-10-CM | POA: Diagnosis not present

## 2014-07-19 DIAGNOSIS — H9313 Tinnitus, bilateral: Secondary | ICD-10-CM | POA: Diagnosis not present

## 2014-07-19 DIAGNOSIS — H9121 Sudden idiopathic hearing loss, right ear: Secondary | ICD-10-CM | POA: Diagnosis not present

## 2014-07-20 NOTE — Telephone Encounter (Signed)
Last seen 05/18/14 DWM   No thyroid labs in [facility]

## 2014-07-20 NOTE — Telephone Encounter (Signed)
All of these prescriptions are okay to refill. Please make sure that we get a thyroid profile at her next visit or sooner if she is in this area

## 2014-07-26 DIAGNOSIS — H9312 Tinnitus, left ear: Secondary | ICD-10-CM | POA: Diagnosis not present

## 2014-07-26 DIAGNOSIS — H8102 Meniere's disease, left ear: Secondary | ICD-10-CM | POA: Diagnosis not present

## 2014-07-26 DIAGNOSIS — J310 Chronic rhinitis: Secondary | ICD-10-CM | POA: Diagnosis not present

## 2014-08-02 DIAGNOSIS — H9313 Tinnitus, bilateral: Secondary | ICD-10-CM | POA: Diagnosis not present

## 2014-08-02 DIAGNOSIS — H9042 Sensorineural hearing loss, unilateral, left ear, with unrestricted hearing on the contralateral side: Secondary | ICD-10-CM | POA: Diagnosis not present

## 2014-08-02 DIAGNOSIS — H903 Sensorineural hearing loss, bilateral: Secondary | ICD-10-CM | POA: Diagnosis not present

## 2014-08-02 DIAGNOSIS — H8102 Meniere's disease, left ear: Secondary | ICD-10-CM | POA: Diagnosis not present

## 2014-08-02 DIAGNOSIS — H9041 Sensorineural hearing loss, unilateral, right ear, with unrestricted hearing on the contralateral side: Secondary | ICD-10-CM | POA: Diagnosis not present

## 2014-08-22 ENCOUNTER — Other Ambulatory Visit: Payer: Self-pay | Admitting: Family Medicine

## 2014-08-22 NOTE — Telephone Encounter (Signed)
No TSH in epic at all

## 2014-08-22 NOTE — Telephone Encounter (Signed)
Please make sure this patient gets a thyroid profile at her convenience and prescription may be refilled

## 2014-08-29 DIAGNOSIS — H8102 Meniere's disease, left ear: Secondary | ICD-10-CM | POA: Diagnosis not present

## 2014-09-01 ENCOUNTER — Encounter: Payer: Self-pay | Admitting: Physician Assistant

## 2014-09-01 ENCOUNTER — Ambulatory Visit (INDEPENDENT_AMBULATORY_CARE_PROVIDER_SITE_OTHER): Payer: Medicare Other | Admitting: Physician Assistant

## 2014-09-01 VITALS — BP 125/70 | HR 70 | Temp 98.8°F | Ht 61.0 in | Wt 200.0 lb

## 2014-09-01 DIAGNOSIS — N3090 Cystitis, unspecified without hematuria: Secondary | ICD-10-CM | POA: Diagnosis not present

## 2014-09-01 DIAGNOSIS — M545 Low back pain: Secondary | ICD-10-CM

## 2014-09-01 LAB — POCT URINALYSIS DIPSTICK
Bilirubin, UA: NEGATIVE
Blood, UA: NEGATIVE
GLUCOSE UA: NEGATIVE
Nitrite, UA: NEGATIVE
PROTEIN UA: NEGATIVE
Spec Grav, UA: 1.01
Urobilinogen, UA: NEGATIVE
pH, UA: 7

## 2014-09-01 LAB — POCT UA - MICROSCOPIC ONLY
Bacteria, U Microscopic: NEGATIVE
Casts, Ur, LPF, POC: NEGATIVE
Crystals, Ur, HPF, POC: NEGATIVE
MUCUS UA: NEGATIVE
RBC, urine, microscopic: NEGATIVE
Yeast, UA: NEGATIVE

## 2014-09-01 MED ORDER — CIPROFLOXACIN HCL 500 MG PO TABS: 500.0000 mg | ORAL_TABLET | Freq: Two times a day (BID) | ORAL | Status: DC

## 2014-09-01 NOTE — Patient Instructions (Signed)

## 2014-09-01 NOTE — Progress Notes (Signed)
   Subjective:    Patient ID: Selena Hunt, female    DOB: 1950-11-02, 64 y.o.   MRN: [id number]  HPI  64 y/o female presents with c/o cloudy urine in the morning with foul odor. Pain/ burning with urination yesterday.  Associated low back pain    Review of Systems  Constitutional: Negative for fever, chills, diaphoresis and fatigue.  Gastrointestinal: Negative.   Genitourinary: Positive for dysuria, urgency, frequency and flank pain (low back , bilateral , has a "right pelvis kidney" ). Negative for vaginal bleeding, vaginal discharge and vaginal pain.       Objective:   Physical Exam  Constitutional: She is oriented to person, place, and time.  Abdominal:  No suprapubic ttp   Musculoskeletal:  No CVA tenderness  Neurological: She is alert and oriented to person, place, and time.  Psychiatric: She has a normal mood and affect. Her behavior is normal. Judgment and thought content normal.    Results for orders placed or performed in visit on 09/01/14  POCT UA - Microscopic Only  Result Value Ref Range   WBC, Ur, HPF, POC 5-10    RBC, urine, microscopic neg    Bacteria, U Microscopic neg    Mucus, UA neg    Epithelial cells, urine per micros occ    Crystals, Ur, HPF, POC neg    Casts, Ur, LPF, POC neg    Yeast, UA neg   POCT urinalysis dipstick  Result Value Ref Range   Color, UA yellow    Clarity, UA cloudy    Glucose, UA neg    Bilirubin, UA neg    Ketones, UA ne    Spec Grav, UA 1.010    Blood, UA neg    pH, UA 7.0    Protein, UA neg    Urobilinogen, UA negative    Nitrite, UA neg    Leukocytes, UA Trace            Assessment & Plan:  1. Low back pain without sciatica, unspecified back pain laterality  - POCT UA - Microscopic Only - POCT urinalysis dipstick  2. Cystitis  - ciprofloxacin (CIPRO) 500 MG tablet; Take 1 tablet (500 mg total) by mouth 2 (two) times daily.  Dispense: 14 tablet; Refill: 0 - Urine culture   RTO prn   Prathik Aman A.  Benjamin Stain PA-C

## 2014-09-05 LAB — URINE CULTURE

## 2014-09-16 ENCOUNTER — Other Ambulatory Visit: Payer: Self-pay | Admitting: Family Medicine

## 2014-09-16 NOTE — Telephone Encounter (Signed)
No TSH in epic

## 2014-09-18 NOTE — Telephone Encounter (Signed)
Kay to refill but please make sure that patient gets thyroid profile

## 2014-10-11 ENCOUNTER — Encounter (INDEPENDENT_AMBULATORY_CARE_PROVIDER_SITE_OTHER): Payer: Self-pay

## 2014-10-11 ENCOUNTER — Ambulatory Visit (INDEPENDENT_AMBULATORY_CARE_PROVIDER_SITE_OTHER): Payer: Medicare Other | Admitting: Family Medicine

## 2014-10-11 ENCOUNTER — Ambulatory Visit (INDEPENDENT_AMBULATORY_CARE_PROVIDER_SITE_OTHER): Payer: Medicare Other

## 2014-10-11 ENCOUNTER — Encounter: Payer: Self-pay | Admitting: Family Medicine

## 2014-10-11 ENCOUNTER — Other Ambulatory Visit: Payer: Self-pay | Admitting: Family Medicine

## 2014-10-11 VITALS — BP 110/78 | HR 66 | Temp 98.0°F | Ht 61.0 in | Wt 196.0 lb

## 2014-10-11 DIAGNOSIS — R5383 Other fatigue: Secondary | ICD-10-CM | POA: Diagnosis not present

## 2014-10-11 DIAGNOSIS — M549 Dorsalgia, unspecified: Secondary | ICD-10-CM

## 2014-10-11 DIAGNOSIS — E559 Vitamin D deficiency, unspecified: Secondary | ICD-10-CM | POA: Diagnosis not present

## 2014-10-11 DIAGNOSIS — R0602 Shortness of breath: Secondary | ICD-10-CM

## 2014-10-11 DIAGNOSIS — I10 Essential (primary) hypertension: Secondary | ICD-10-CM

## 2014-10-11 DIAGNOSIS — K2270 Barrett's esophagus without dysplasia: Secondary | ICD-10-CM | POA: Diagnosis not present

## 2014-10-11 DIAGNOSIS — E8881 Metabolic syndrome: Secondary | ICD-10-CM | POA: Diagnosis not present

## 2014-10-11 DIAGNOSIS — Z780 Asymptomatic menopausal state: Secondary | ICD-10-CM

## 2014-10-11 DIAGNOSIS — E039 Hypothyroidism, unspecified: Secondary | ICD-10-CM

## 2014-10-11 DIAGNOSIS — E785 Hyperlipidemia, unspecified: Secondary | ICD-10-CM

## 2014-10-11 DIAGNOSIS — R0989 Other specified symptoms and signs involving the circulatory and respiratory systems: Secondary | ICD-10-CM

## 2014-10-11 MED ORDER — HYDROCODONE-ACETAMINOPHEN 5-325 MG PO TABS: ORAL_TABLET | ORAL | Status: DC

## 2014-10-11 NOTE — Patient Instructions (Addendum)
Medicare Annual Wellness Visit  [facility] and the medical providers at [facility] strive to bring you the best medical care.  In doing so we not only want to address your current medical conditions and concerns but also to detect new conditions early and prevent illness, disease and health-related problems.    Medicare offers a yearly Wellness Visit which allows our clinical staff to assess your need for preventative services including immunizations, lifestyle education, counseling to decrease risk of preventable diseases and screening for fall risk and other medical concerns.    This visit is provided free of charge (no copay) for all Medicare recipients. The clinical pharmacists at [facility] have begun to conduct these Wellness Visits which will also include a thorough review of all your medications.    As you primary medical provider recommend that you make an appointment for your Annual Wellness Visit if you have not done so already this year.  You may set up this appointment before you leave today or you may call back ([phone]) and schedule an appointment.  Please make sure when you call that you mention that you are scheduling your Annual Wellness Visit with the clinical pharmacist so that the appointment may be made for the proper length of time.    Continue current medications. Continue good therapeutic lifestyle changes which include good diet and exercise. Fall precautions discussed with patient. If an FOBT was given today- please return it to our front desk. If you are over 40 years old - you may need Prevnar 73 or the adult Pneumonia vaccine.  Flu Shots are still available at our office. If you still haven't had one please call to set up a nurse visit to get one.   After your visit with [place] today you will receive a survey in the mail or online from [employer] regarding your care with [place]. Please take a moment to  fill this out. Your feedback is very important to [place] as you can help [place] better understand your patient needs as well as improve your experience and satisfaction. WE CARE ABOUT YOU!!!   Drink more fluids Monitor blood pressures at home and bring these readings for review in one week when you get your lab work We will arrange to get some further studies of the circulation and your right arm Please return the FOBT We will call you with the DEXA scan results once they become available

## 2014-10-11 NOTE — Addendum Note (Signed)
Addended by: Zannie Cove on: 10/11/2014 10:42 AM   Modules accepted: Orders

## 2014-10-11 NOTE — Progress Notes (Signed)
Subjective:    Patient ID: Selena Hunt, female    DOB: 1951-02-04, 64 y.o.   MRN: [id number]  HPI Pt here for follow up and management of chronic medical problems which includes hypertension, hyperlipidemia, and hypothyroid. She is taking medications regularly. Patient today is complaining of some more fatigue than usual and also is complaining of more myalgias since a fall in May. The fatigue is been going on for couple of months. She has not checked any blood pressures at home. Since the fall she has been hurting in her mid back between her shoulder blades and her right shoulder. She is due to return an FOBT but it is too early for her lab work and future orders will be placed for her to come back to get this. She is due to get a DEXA scan and will be due to get a mammogram sometime this month. She denies chest pain or shortness of breath or trouble swallowing. She is having some increased heartburn and she is trying to reduce her Nexium to once a day. She denies blood in the stool or black tarry bowel movements or problems with voiding although she recently got over a urinary tract infection.     Patient Active Problem List   Diagnosis Date Noted  . Vitamin D deficiency 09/13/2013  . Chest pain 09/04/2013  . Metabolic syndrome 57/84/6962  . Migraine headache, history of   . Lumbosacral spondylosis without myelopathy   . Diverticulitis of colon (without mention of hemorrhage)   . Obesity   . Fatigue, chronic   . Female stress incontinence   . Hypertension   . Hematuria   . Hyperlipidemia   . Benign neoplasm of colon   . Stricture and stenosis of esophagus    Outpatient Encounter Prescriptions as of 10/11/2014  Medication Sig  . calcium-vitamin D (OSCAL WITH D) 500-200 MG-UNIT per tablet Take 1 tablet by mouth daily.  . fluticasone (FLONASE) 50 MCG/ACT nasal spray Place 2 sprays into both nostrils daily.  . hydrochlorothiazide (HYDRODIURIL) 25 MG tablet TAKE 1 TABLET BY MOUTH  EVERY DAY  . metoprolol (LOPRESSOR) 50 MG tablet TAKE 1 TABLET BY MOUTH EVERY DAY  . multivitamin-iron-minerals-folic acid (CENTRUM) chewable tablet Chew 1 tablet by mouth daily.    Marland Kitchen NEXIUM 40 MG capsule TAKE 1 CAPSULE BY MOUTH TWICE DAILY BEFORE BREAKFAST AND SUPPER  . oxybutynin (DITROPAN XL) 10 MG 24 hr tablet Take 1 tablet (10 mg total) by mouth at bedtime.  Marland Kitchen SYNTHROID 100 MCG tablet TAKE 1/2 TABLET BY MOUTH EVERY DAY BEFORE BREAKFAST, EXCEPT ON MONDAYS AND FRIDAYS, THEN TAKE 1 TABLET BY MOUTH BEFORE BREAKFAST  . [DISCONTINUED] ciprofloxacin (CIPRO) 500 MG tablet Take 1 tablet (500 mg total) by mouth 2 (two) times daily.  . [DISCONTINUED] VESICARE 10 MG tablet TAKE 1 TABLET BY MOUTH EVERY DAY   No facility-administered encounter medications on file as of 10/11/2014.      Review of Systems  Constitutional: Positive for fatigue.  HENT: Negative.   Eyes: Negative.   Respiratory: Negative.   Cardiovascular: Negative.   Gastrointestinal: Negative.   Endocrine: Negative.   Genitourinary: Negative.   Musculoskeletal: Positive for myalgias (since fall in May).  Skin: Negative.   Allergic/Immunologic: Negative.   Neurological: Negative.   Hematological: Negative.   Psychiatric/Behavioral: Negative.        Objective:   Physical Exam  Constitutional: She is oriented to person, place, and time. She appears well-developed and well-nourished. No distress.  HENT:  Head: Normocephalic and atraumatic.  Right Ear: External ear normal.  Left Ear: External ear normal.  Nose: Nose normal.  Mouth/Throat: Oropharynx is clear and moist. No oropharyngeal exudate.  Eyes: Conjunctivae and EOM are normal. Pupils are equal, round, and reactive to light. Right eye exhibits no discharge. Left eye exhibits no discharge. No scleral icterus.  Neck: Normal range of motion. Neck supple. No thyromegaly present.  No carotid bruits or supraclavicular bruits bilaterally. No thyromegaly.  Cardiovascular:  Normal rate, regular rhythm, normal heart sounds and intact distal pulses.  Exam reveals no gallop and no friction rub.   No murmur heard. The radial pulses in the right arm was difficult to palpate compared to that in the left arm. At 72/m  Pulmonary/Chest: Effort normal and breath sounds normal. No respiratory distress. She has no wheezes. She has no rales. She exhibits no tenderness.  Clear anteriorly and posteriorly  Abdominal: Soft. Bowel sounds are normal. She exhibits no mass. There is no tenderness. There is no rebound and no guarding.  No abdominal tenderness or masses and bowel sounds were quiet.  Musculoskeletal: Normal range of motion. She exhibits no edema or tenderness.  There was pain and tenderness to palpation just to the right of the upper mid thoracic spine.  Lymphadenopathy:    She has no cervical adenopathy.  Neurological: She is alert and oriented to person, place, and time. She has normal reflexes. No cranial nerve deficit.  Reflexes were slightly decreased in the right upper extremity  Skin: Skin is warm and dry. No rash noted.  Psychiatric: She has a normal mood and affect. Her behavior is normal. Judgment and thought content normal.  Nursing note and vitals reviewed.  BP 96/59 mmHg  Pulse 66  Temp(Src) 98 F (36.7 C) (Oral)  Ht _0  (1.549 m)  Wt 196 lb (88.905 kg)  BMI 37.05 kg/m2  [facility] reading (PRIMARY) by  Dr. Brunilda Payor x-ray and thoracic spine--   degenerative changes no active disease                                     Assessment & Plan:  1. Hyperlipidemia -This patient is statin intolerant and she will continue at the present time with aggressive therapeutic lifestyle changes. - POCT CBC; Future - NMR, lipoprofile; Future  2. Hypertension -The initial blood pressure was decreased today a repeat blood pressure in the room was improved. We will make no changes today but the patient will monitor blood pressure readings at home and bring these in  for review when she comes back for her repeat lab work. - POCT CBC; Future - BMP8+EGFR; Future - Hepatic function panel; Future  3. Vitamin D deficiency -No change in treatment pending results of lab work to be done - POCT CBC; Future - Vit D  25 hydroxy (rtn osteoporosis monitoring); Future  4. Barrett's esophagus -The patient is currently trying to reduce her Nexium to once a day and is supplementing this with Zantac and does have some extra heartburn that she feels like is getting better. - POCT CBC; Future  5. Hypothyroidism, unspecified hypothyroidism type -No change in treatment pending results of lab work - POCT CBC; Future - Thyroid Panel With TSH; Future  6. Metabolic syndrome -Is continues to be an ongoing problem and the cause of accidents in the past she is not able to aggressively do a lot of physical  activity. She will continue with her diet as much as possible - POCT CBC; Future - BMP8+EGFR; Future  7. SOB (shortness of breath) -The shortness of breath was present when she went off of her fluid buildup because she went on a trip and left it at home and this is now resolved. - DG Chest 2 View; Future - DG Thoracic Spine 2 View; Future  8. Mid back pain -The upper mid back pain has been going on for several days but is especially prominent since a fall in May. - DG Chest 2 View; Future - DG Thoracic Spine 2 View; Future  9. Other fatigue -We will recheck lab work FOBT and potassium in one week.  No orders of the defined types were placed in this encounter.   Patient Instructions                       Medicare Annual Wellness Visit  [facility] and the medical providers at [facility] strive to bring you the best medical care.  In doing so we not only want to address your current medical conditions and concerns but also to detect new conditions early and prevent illness, disease and health-related problems.    Medicare offers a yearly  Wellness Visit which allows our clinical staff to assess your need for preventative services including immunizations, lifestyle education, counseling to decrease risk of preventable diseases and screening for fall risk and other medical concerns.    This visit is provided free of charge (no copay) for all Medicare recipients. The clinical pharmacists at [facility] have begun to conduct these Wellness Visits which will also include a thorough review of all your medications.    As you primary medical provider recommend that you make an appointment for your Annual Wellness Visit if you have not done so already this year.  You may set up this appointment before you leave today or you may call back [phone]) and schedule an appointment.  Please make sure when you call that you mention that you are scheduling your Annual Wellness Visit with the clinical pharmacist so that the appointment may be made for the proper length of time.    Continue current medications. Continue good therapeutic lifestyle changes which include good diet and exercise. Fall precautions discussed with patient. If an FOBT was given today- please return it to our front desk. If you are over 33 years old - you may need Prevnar 55 or the adult Pneumonia vaccine.  Flu Shots are still available at our office. If you still haven't had one please call to set up a nurse visit to get one.   After your visit with [place] today you will receive a survey in the mail or online from [employer] regarding your care with [place]. Please take a moment to fill this out. Your feedback is very important to [place] as you can help [place] better understand your patient needs as well as improve your experience and satisfaction. WE CARE ABOUT YOU!!!   Drink more fluids Monitor blood pressures at home and bring these readings for review in one week when you get your lab work We will arrange to get some further studies of the circulation and your right  arm Please return the FOBT We will call you with the DEXA scan results once they become available   Arrie Senate MD

## 2014-10-17 ENCOUNTER — Other Ambulatory Visit (INDEPENDENT_AMBULATORY_CARE_PROVIDER_SITE_OTHER): Payer: Medicare Other

## 2014-10-17 ENCOUNTER — Ambulatory Visit (INDEPENDENT_AMBULATORY_CARE_PROVIDER_SITE_OTHER): Payer: Medicare Other

## 2014-10-17 DIAGNOSIS — E039 Hypothyroidism, unspecified: Secondary | ICD-10-CM

## 2014-10-17 DIAGNOSIS — Z1212 Encounter for screening for malignant neoplasm of rectum: Secondary | ICD-10-CM

## 2014-10-17 DIAGNOSIS — Z780 Asymptomatic menopausal state: Secondary | ICD-10-CM | POA: Diagnosis not present

## 2014-10-17 DIAGNOSIS — K2270 Barrett's esophagus without dysplasia: Secondary | ICD-10-CM

## 2014-10-17 DIAGNOSIS — E785 Hyperlipidemia, unspecified: Secondary | ICD-10-CM

## 2014-10-17 DIAGNOSIS — E8881 Metabolic syndrome: Secondary | ICD-10-CM

## 2014-10-17 DIAGNOSIS — I10 Essential (primary) hypertension: Secondary | ICD-10-CM

## 2014-10-17 DIAGNOSIS — E559 Vitamin D deficiency, unspecified: Secondary | ICD-10-CM

## 2014-10-17 LAB — POCT CBC
Granulocyte percent: 67.1 %G (ref 37–80)
HEMATOCRIT: 44.6 % (ref 37.7–47.9)
Hemoglobin: 14.4 g/dL (ref 12.2–16.2)
Lymph, poc: 1.2 (ref 0.6–3.4)
MCH: 26.8 pg — AB (ref 27–31.2)
MCHC: 32.2 g/dL (ref 31.8–35.4)
MCV: 83.3 fL (ref 80–97)
MPV: 9.8 fL (ref 0–99.8)
PLATELET COUNT, POC: 178 10*3/uL (ref 142–424)
POC GRANULOCYTE: 3.2 (ref 2–6.9)
POC LYMPH %: 25.8 % (ref 10–50)
RBC: 5.36 M/uL (ref 4.04–5.48)
RDW, POC: 13 %
WBC: 4.8 10*3/uL (ref 4.6–10.2)

## 2014-10-17 NOTE — Progress Notes (Signed)
Lab only 

## 2014-10-17 NOTE — Addendum Note (Signed)
Addended by: Wyline Mood on: 10/17/2014 08:49 AM   Modules accepted: Orders

## 2014-10-18 ENCOUNTER — Other Ambulatory Visit: Payer: Self-pay | Admitting: Family Medicine

## 2014-10-18 LAB — HEPATIC FUNCTION PANEL
ALT: 20 IU/L (ref 0–32)
AST: 20 IU/L (ref 0–40)
Albumin: 4 g/dL (ref 3.6–4.8)
Alkaline Phosphatase: 88 IU/L (ref 39–117)
BILIRUBIN, DIRECT: 0.12 mg/dL (ref 0.00–0.40)
Bilirubin Total: 0.5 mg/dL (ref 0.0–1.2)
Total Protein: 5.9 g/dL — ABNORMAL LOW (ref 6.0–8.5)

## 2014-10-18 LAB — NMR, LIPOPROFILE
Cholesterol: 222 mg/dL — ABNORMAL HIGH (ref 100–199)
HDL CHOLESTEROL BY NMR: 44 mg/dL (ref >39)
HDL Particle Number: 34.7 umol/L (ref >=30.5)
LDL Particle Number: 2222 nmol/L — ABNORMAL HIGH (ref <1000)
LDL Size: 20.3 nm (ref >20.5)
LDL-C: 140 mg/dL — ABNORMAL HIGH (ref 0–99)
LP-IR Score: 76 — ABNORMAL HIGH (ref <=45)
Small LDL Particle Number: 1417 nmol/L — ABNORMAL HIGH (ref <=527)
TRIGLYCERIDES BY NMR: 191 mg/dL — AB (ref 0–149)

## 2014-10-18 LAB — THYROID PANEL WITH TSH
FREE THYROXINE INDEX: 2.2 (ref 1.2–4.9)
T3 UPTAKE RATIO: 29 % (ref 24–39)
T4, Total: 7.7 ug/dL (ref 4.5–12.0)
TSH: 3.56 u[IU]/mL (ref 0.450–4.500)

## 2014-10-18 LAB — BMP8+EGFR
BUN/Creatinine Ratio: 17 (ref 11–26)
BUN: 18 mg/dL (ref 8–27)
CO2: 26 mmol/L (ref 18–29)
Calcium: 9.6 mg/dL (ref 8.7–10.3)
Chloride: 101 mmol/L (ref 97–108)
Creatinine, Ser: 1.06 mg/dL — ABNORMAL HIGH (ref 0.57–1.00)
GFR, EST AFRICAN AMERICAN: 64 mL/min/{1.73_m2} (ref >59)
GFR, EST NON AFRICAN AMERICAN: 55 mL/min/{1.73_m2} — AB (ref >59)
GLUCOSE: 101 mg/dL — AB (ref 65–99)
POTASSIUM: 3.9 mmol/L (ref 3.5–5.2)
Sodium: 142 mmol/L (ref 134–144)

## 2014-10-18 LAB — VITAMIN D 25 HYDROXY (VIT D DEFICIENCY, FRACTURES): VIT D 25 HYDROXY: 21.2 ng/mL — AB (ref 30.0–100.0)

## 2014-10-19 LAB — FECAL OCCULT BLOOD, IMMUNOCHEMICAL: FECAL OCCULT BLD: NEGATIVE

## 2014-10-19 MED ORDER — VITAMIN D (ERGOCALCIFEROL) 1.25 MG (50000 UNIT) PO CAPS: 50000.0000 [IU] | ORAL_CAPSULE | ORAL | Status: DC

## 2014-10-19 NOTE — Addendum Note (Signed)
Addended by: Shelbie Ammons on: 10/19/2014 03:14 PM   Modules accepted: Orders

## 2014-10-28 DIAGNOSIS — H52223 Regular astigmatism, bilateral: Secondary | ICD-10-CM | POA: Diagnosis not present

## 2014-10-28 DIAGNOSIS — H4720 Unspecified optic atrophy: Secondary | ICD-10-CM | POA: Diagnosis not present

## 2014-10-28 DIAGNOSIS — H33302 Unspecified retinal break, left eye: Secondary | ICD-10-CM | POA: Diagnosis not present

## 2014-10-28 DIAGNOSIS — H5213 Myopia, bilateral: Secondary | ICD-10-CM | POA: Diagnosis not present

## 2014-11-15 ENCOUNTER — Other Ambulatory Visit: Payer: Self-pay | Admitting: Family Medicine

## 2014-12-13 DIAGNOSIS — Z1231 Encounter for screening mammogram for malignant neoplasm of breast: Secondary | ICD-10-CM | POA: Diagnosis not present

## 2014-12-22 ENCOUNTER — Encounter: Payer: Self-pay | Admitting: Family Medicine

## 2015-01-10 ENCOUNTER — Encounter: Payer: Self-pay | Admitting: Family Medicine

## 2015-01-10 ENCOUNTER — Ambulatory Visit (INDEPENDENT_AMBULATORY_CARE_PROVIDER_SITE_OTHER): Payer: Medicare Other | Admitting: Family Medicine

## 2015-01-10 VITALS — BP 128/75 | HR 70 | Temp 97.5°F | Ht 61.0 in | Wt 201.0 lb

## 2015-01-10 DIAGNOSIS — R350 Frequency of micturition: Secondary | ICD-10-CM | POA: Diagnosis not present

## 2015-01-10 DIAGNOSIS — R8290 Unspecified abnormal findings in urine: Secondary | ICD-10-CM | POA: Diagnosis not present

## 2015-01-10 DIAGNOSIS — R300 Dysuria: Secondary | ICD-10-CM | POA: Diagnosis not present

## 2015-01-10 DIAGNOSIS — N3001 Acute cystitis with hematuria: Secondary | ICD-10-CM

## 2015-01-10 LAB — POCT UA - MICROSCOPIC ONLY
Bacteria, U Microscopic: NEGATIVE
CASTS, UR, LPF, POC: NEGATIVE
Crystals, Ur, HPF, POC: NEGATIVE
Mucus, UA: NEGATIVE
YEAST UA: NEGATIVE

## 2015-01-10 LAB — POCT URINALYSIS DIPSTICK

## 2015-01-10 MED ORDER — SULFAMETHOXAZOLE-TRIMETHOPRIM 800-160 MG PO TABS: 1.0000 | ORAL_TABLET | Freq: Two times a day (BID) | ORAL | Status: DC

## 2015-01-10 MED ORDER — PHENAZOPYRIDINE HCL 200 MG PO TABS: 200.0000 mg | ORAL_TABLET | Freq: Three times a day (TID) | ORAL | Status: DC | PRN

## 2015-01-10 NOTE — Progress Notes (Signed)
Subjective:    Patient ID: Selena Hunt, female    DOB: 05-17-1950, 64 y.o.   MRN: [id number]  HPI Patient here today for possible UTI. She is experiencing odor, frequency and dysuria. The discomfort has been especially bad for the past 4 days and she started taking some Azo over-the-counter yesterday which helped her symptoms. She's had some low back pain and burning frequency and a malodorous urine specimen. She had a urinary tract infection she said a couple months ago and took sulfa or Cipro for this at that time and got better. She normally does not have urinary tract infections.         Patient Active Problem List   Diagnosis Date Noted  . Vitamin D deficiency 09/13/2013  . Chest pain 09/04/2013  . Metabolic syndrome 26/33/3545  . Migraine headache, history of   . Lumbosacral spondylosis without myelopathy   . Diverticulitis of colon (without mention of hemorrhage)   . Obesity   . Fatigue, chronic   . Female stress incontinence   . Hypertension   . Hematuria   . Hyperlipidemia   . Benign neoplasm of colon   . Stricture and stenosis of esophagus    Outpatient Encounter Prescriptions as of 01/10/2015  Medication Sig  . calcium-vitamin D (OSCAL WITH D) 500-200 MG-UNIT per tablet Take 1 tablet by mouth daily.  . fluticasone (FLONASE) 50 MCG/ACT nasal spray Place 2 sprays into both nostrils daily.  . hydrochlorothiazide (HYDRODIURIL) 25 MG tablet TAKE 1 TABLET BY MOUTH EVERY DAY  . HYDROcodone-acetaminophen (NORCO/VICODIN) 5-325 MG per tablet 1/2 to a whole tab BID PRN  . metoprolol (LOPRESSOR) 50 MG tablet TAKE 1 TABLET BY MOUTH EVERY DAY  . multivitamin-iron-minerals-folic acid (CENTRUM) chewable tablet Chew 1 tablet by mouth daily.    Marland Kitchen NEXIUM 40 MG capsule TAKE 1 CAPSULE BY MOUTH TWICE DAILY BEFORE BREAKFAST AND SUPPER  . oxybutynin (DITROPAN XL) 10 MG 24 hr tablet Take 1 tablet (10 mg total) by mouth at bedtime.  Marland Kitchen SYNTHROID 100 MCG tablet TAKE 1/2 TABLET BY MOUTH  EVERY DAY BEFORE BREAKFAST EXCEPT ON MONDAYS AND FRIDAYS, THEN TAKE 1 TABLET BY MOUTH BEFORE BREAKFAST  . Vitamin D, Ergocalciferol, (DRISDOL) 50000 UNITS CAPS capsule Take 1 capsule (50,000 Units total) by mouth every 7 (seven) days.   No facility-administered encounter medications on file as of 01/10/2015.     Review of Systems  Constitutional: Negative.   HENT: Negative.   Eyes: Negative.   Respiratory: Negative.   Cardiovascular: Negative.   Gastrointestinal: Negative.   Endocrine: Negative.   Genitourinary: Positive for dysuria and frequency.       Strong urine odor  Musculoskeletal: Negative.   Skin: Negative.   Allergic/Immunologic: Negative.   Neurological: Negative.   Hematological: Negative.   Psychiatric/Behavioral: Negative.        Objective:   Physical Exam  Constitutional: She appears well-developed and well-nourished. No distress.  Abdominal: Soft. She exhibits no distension and no mass. Tenderness: minimal suprapubic tenderness. There is no rebound and no guarding.  Some low back and flank tenderness  Musculoskeletal: Normal range of motion.  Neurological: She is alert. She has normal reflexes.  Skin: Skin is warm and dry. No rash noted.  Psychiatric: She has a normal mood and affect. Her behavior is normal. Thought content normal.  Nursing note and vitals reviewed.   BP 128/75 mmHg  Pulse 70  Temp(Src) 97.5 F (36.4 C) (Oral)  Ht 5\' 1"  (1.549 m)  Wt  201 lb (91.173 kg)  BMI 38.00 kg/m2 Results for orders placed or performed in visit on 01/10/15  POCT UA - Microscopic Only  Result Value Ref Range   WBC, Ur, HPF, POC 30-40    RBC, urine, microscopic 1-5    Bacteria, U Microscopic neg    Mucus, UA neg    Epithelial cells, urine per micros occ    Crystals, Ur, HPF, POC neg    Casts, Ur, LPF, POC neg    Yeast, UA neg   POCT urinalysis dipstick  Result Value Ref Range   Color, UA orange    Clarity, UA cloudy    Glucose, UA color interference     Bilirubin, UA color interference    Ketones, UA color interference    Blood, UA color interference    Protein, UA color interference    Nitrite, UA color interference          Assessment & Plan:  1. Dysuria -Drink plenty of fluids and take Pyridium as directed - POCT UA - Microscopic Only - POCT urinalysis dipstick - Urine culture - phenazopyridine (PYRIDIUM) 200 MG tablet; Take 1 tablet (200 mg total) by mouth 3 (three) times daily as needed for pain.  Dispense: 10 tablet; Refill: 0  2. Frequent urination -Continue to drink plenty of fluids and take Pyridium - POCT UA - Microscopic Only - POCT urinalysis dipstick - Urine culture - phenazopyridine (PYRIDIUM) 200 MG tablet; Take 1 tablet (200 mg total) by mouth 3 (three) times daily as needed for pain.  Dispense: 10 tablet; Refill: 0  3. Abnormal urine odor -A catabolic until completed and recheck urinalysis a few days after completion - POCT UA - Microscopic Only - POCT urinalysis dipstick - Urine culture - sulfamethoxazole-trimethoprim (BACTRIM DS,SEPTRA DS) 800-160 MG tablet; Take 1 tablet by mouth 2 (two) times daily.  Dispense: 20 tablet; Refill: 0  4. Acute cystitis with hematuria - sulfamethoxazole-trimethoprim (BACTRIM DS,SEPTRA DS) 800-160 MG tablet; Take 1 tablet by mouth 2 (two) times daily.  Dispense: 20 tablet; Refill: 0  Patient Instructions  Drink plenty fluids and take antibiotic and Pyridium as directed A urinalysis should be repeated a few days after completing the antibiotic We will do a culture and sensitivity and if the antibiotic turns out not to be appropriate for the infection we will change the antibiotic to a different one   Arrie Senate MD

## 2015-01-10 NOTE — Patient Instructions (Signed)
Drink plenty fluids and take antibiotic and Pyridium as directed A urinalysis should be repeated a few days after completing the antibiotic We will do a culture and sensitivity and if the antibiotic turns out not to be appropriate for the infection we will change the antibiotic to a different one

## 2015-01-13 LAB — URINE CULTURE

## 2015-03-06 ENCOUNTER — Encounter (INDEPENDENT_AMBULATORY_CARE_PROVIDER_SITE_OTHER): Payer: Self-pay

## 2015-03-06 ENCOUNTER — Encounter: Payer: Self-pay | Admitting: Family Medicine

## 2015-03-06 ENCOUNTER — Ambulatory Visit (INDEPENDENT_AMBULATORY_CARE_PROVIDER_SITE_OTHER): Payer: Medicare Other | Admitting: Family Medicine

## 2015-03-06 VITALS — BP 111/66 | HR 71 | Temp 97.8°F | Ht 61.0 in | Wt 199.0 lb

## 2015-03-06 DIAGNOSIS — M47817 Spondylosis without myelopathy or radiculopathy, lumbosacral region: Secondary | ICD-10-CM | POA: Diagnosis not present

## 2015-03-06 DIAGNOSIS — R110 Nausea: Secondary | ICD-10-CM

## 2015-03-06 DIAGNOSIS — E559 Vitamin D deficiency, unspecified: Secondary | ICD-10-CM | POA: Diagnosis not present

## 2015-03-06 DIAGNOSIS — R109 Unspecified abdominal pain: Secondary | ICD-10-CM | POA: Diagnosis not present

## 2015-03-06 DIAGNOSIS — I10 Essential (primary) hypertension: Secondary | ICD-10-CM | POA: Diagnosis not present

## 2015-03-06 DIAGNOSIS — K22719 Barrett's esophagus with dysplasia, unspecified: Secondary | ICD-10-CM | POA: Diagnosis not present

## 2015-03-06 DIAGNOSIS — E8881 Metabolic syndrome: Secondary | ICD-10-CM

## 2015-03-06 DIAGNOSIS — E785 Hyperlipidemia, unspecified: Secondary | ICD-10-CM

## 2015-03-06 DIAGNOSIS — E039 Hypothyroidism, unspecified: Secondary | ICD-10-CM | POA: Diagnosis not present

## 2015-03-06 DIAGNOSIS — Z23 Encounter for immunization: Secondary | ICD-10-CM | POA: Diagnosis not present

## 2015-03-06 MED ORDER — HYDROCHLOROTHIAZIDE 12.5 MG PO CAPS: 12.5000 mg | ORAL_CAPSULE | Freq: Every day | ORAL | Status: DC

## 2015-03-06 NOTE — Patient Instructions (Addendum)
Medicare Annual Wellness Visit  [facility] and the medical providers at [facility] strive to bring you the best medical care.  In doing so we not only want to address your current medical conditions and concerns but also to detect new conditions early and prevent illness, disease and health-related problems.    Medicare offers a yearly Wellness Visit which allows our clinical staff to assess your need for preventative services including immunizations, lifestyle education, counseling to decrease risk of preventable diseases and screening for fall risk and other medical concerns.    This visit is provided free of charge (no copay) for all Medicare recipients. The clinical pharmacists at [facility] have begun to conduct these Wellness Visits which will also include a thorough review of all your medications.    As you primary medical provider recommend that you make an appointment for your Annual Wellness Visit if you have not done so already this year.  You may set up this appointment before you leave today or you may call back [phone] and schedule an appointment.  Please make sure when you call that you mention that you are scheduling your Annual Wellness Visit with the clinical pharmacist so that the appointment may be made for the proper length of time.     Continue current medications. Continue good therapeutic lifestyle changes which include good diet and exercise. Fall precautions discussed with patient. If an FOBT was given today- please return it to our front desk. If you are over 22 years old - you may need Prevnar 52 or the adult Pneumonia vaccine.  **Flu shots are available--- please call and schedule a FLU-CLINIC appointment**  After your visit with [place] today you will receive a survey in the mail or online from [employer] regarding your care with [place]. Please take a moment to fill this out. Your feedback is very  important to [place] as you can help [place] better understand your patient needs as well as improve your experience and satisfaction. WE CARE ABOUT YOU!!!   The patient should return the FOBT She should try the samples of the new medicine for her stoma once a day and take the Nexium at a different time of the day If her stomach is still giving her problems and the ultrasound is negative we will schedule her for a visit with the gastroenterologist and a possible endoscopy. She should reduce her caffeine intake She should continue to drink plenty of fluids
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
## 2015-03-06 NOTE — Addendum Note (Signed)
Addended by: Zannie Cove on: 03/06/2015 09:22 AM   Modules accepted: Orders

## 2015-03-07 LAB — THYROID PANEL WITH TSH
FREE THYROXINE INDEX: 2.4 (ref 1.2–4.9)
T3 UPTAKE RATIO: 30 % (ref 24–39)
T4 TOTAL: 8.1 ug/dL (ref 4.5–12.0)
TSH: 1.99 u[IU]/mL (ref 0.450–4.500)

## 2015-03-07 LAB — NMR, LIPOPROFILE
CHOLESTEROL: 220 mg/dL — AB (ref 100–199)
HDL CHOLESTEROL BY NMR: 51 mg/dL (ref >39)
HDL PARTICLE NUMBER: 38.7 umol/L (ref >=30.5)
LDL PARTICLE NUMBER: 1705 nmol/L — AB (ref <1000)
LDL Size: 20.8 nm (ref >20.5)
LDL-C: 147 mg/dL — AB (ref 0–99)
LP-IR SCORE: 46 — AB (ref <=45)
Small LDL Particle Number: 918 nmol/L — ABNORMAL HIGH (ref <=527)
Triglycerides by NMR: 112 mg/dL (ref 0–149)

## 2015-03-07 LAB — CBC WITH DIFFERENTIAL/PLATELET
BASOS ABS: 0 10*3/uL (ref 0.0–0.2)
Basos: 1 %
EOS (ABSOLUTE): 0.2 10*3/uL (ref 0.0–0.4)
Eos: 5 %
Hematocrit: 43 % (ref 34.0–46.6)
Hemoglobin: 14.1 g/dL (ref 11.1–15.9)
Immature Grans (Abs): 0 10*3/uL (ref 0.0–0.1)
Immature Granulocytes: 0 %
LYMPHS ABS: 1.1 10*3/uL (ref 0.7–3.1)
Lymphs: 27 %
MCH: 27.9 pg (ref 26.6–33.0)
MCHC: 32.8 g/dL (ref 31.5–35.7)
MCV: 85 fL (ref 79–97)
MONOS ABS: 0.3 10*3/uL (ref 0.1–0.9)
Monocytes: 8 %
NEUTROS ABS: 2.4 10*3/uL (ref 1.4–7.0)
Neutrophils: 59 %
Platelets: 192 10*3/uL (ref 150–379)
RBC: 5.05 x10E6/uL (ref 3.77–5.28)
RDW: 14.1 % (ref 12.3–15.4)
WBC: 4 10*3/uL (ref 3.4–10.8)

## 2015-03-07 LAB — BMP8+EGFR
BUN/Creatinine Ratio: 20 (ref 11–26)
BUN: 18 mg/dL (ref 8–27)
CALCIUM: 9.4 mg/dL (ref 8.7–10.3)
CHLORIDE: 101 mmol/L (ref 97–106)
CO2: 25 mmol/L (ref 18–29)
Creatinine, Ser: 0.9 mg/dL (ref 0.57–1.00)
GFR, EST AFRICAN AMERICAN: 77 mL/min/{1.73_m2} (ref >59)
GFR, EST NON AFRICAN AMERICAN: 67 mL/min/{1.73_m2} (ref >59)
Glucose: 93 mg/dL (ref 65–99)
POTASSIUM: 3.6 mmol/L (ref 3.5–5.2)
SODIUM: 143 mmol/L (ref 136–144)

## 2015-03-07 LAB — HEPATIC FUNCTION PANEL
ALBUMIN: 4.2 g/dL (ref 3.6–4.8)
ALK PHOS: 84 IU/L (ref 39–117)
ALT: 19 IU/L (ref 0–32)
AST: 22 IU/L (ref 0–40)
Bilirubin Total: 0.4 mg/dL (ref 0.0–1.2)
Bilirubin, Direct: 0.14 mg/dL (ref 0.00–0.40)
TOTAL PROTEIN: 5.9 g/dL — AB (ref 6.0–8.5)

## 2015-03-07 LAB — VITAMIN D 25 HYDROXY (VIT D DEFICIENCY, FRACTURES): VIT D 25 HYDROXY: 31.3 ng/mL (ref 30.0–100.0)

## 2015-03-13 ENCOUNTER — Ambulatory Visit (HOSPITAL_COMMUNITY)
Admission: RE | Admit: 2015-03-13 | Discharge: 2015-03-13 | Disposition: A | Discharge status: Home / Self Care | Payer: Medicare Other | Source: Ambulatory Visit | Attending: Family Medicine | Admitting: Family Medicine

## 2015-03-13 DIAGNOSIS — Q632 Ectopic kidney: Secondary | ICD-10-CM | POA: Insufficient documentation

## 2015-03-13 DIAGNOSIS — R109 Unspecified abdominal pain: Secondary | ICD-10-CM | POA: Insufficient documentation

## 2015-03-13 DIAGNOSIS — R110 Nausea: Secondary | ICD-10-CM | POA: Diagnosis not present

## 2015-03-13 DIAGNOSIS — R932 Abnormal findings on diagnostic imaging of liver and biliary tract: Secondary | ICD-10-CM | POA: Insufficient documentation

## 2015-03-14 ENCOUNTER — Other Ambulatory Visit: Payer: Self-pay | Admitting: Family Medicine

## 2015-03-14 DIAGNOSIS — R932 Abnormal findings on diagnostic imaging of liver and biliary tract: Secondary | ICD-10-CM

## 2015-03-17 ENCOUNTER — Ambulatory Visit (HOSPITAL_COMMUNITY)
Admission: RE | Admit: 2015-03-17 | Discharge: 2015-03-17 | Disposition: A | Discharge status: Home / Self Care | Payer: Medicare Other | Source: Ambulatory Visit | Attending: Family Medicine | Admitting: Family Medicine

## 2015-03-17 DIAGNOSIS — R932 Abnormal findings on diagnostic imaging of liver and biliary tract: Secondary | ICD-10-CM | POA: Insufficient documentation

## 2015-03-17 DIAGNOSIS — K439 Ventral hernia without obstruction or gangrene: Secondary | ICD-10-CM | POA: Insufficient documentation

## 2015-03-17 DIAGNOSIS — K449 Diaphragmatic hernia without obstruction or gangrene: Secondary | ICD-10-CM | POA: Diagnosis not present

## 2015-03-17 DIAGNOSIS — R1013 Epigastric pain: Secondary | ICD-10-CM | POA: Diagnosis not present

## 2015-03-17 MED ORDER — IOHEXOL 300 MG/ML  SOLN
100.0000 mL | Freq: Once | INTRAMUSCULAR | Status: AC | PRN
  Administered 2015-03-17: 100 mL via INTRAVENOUS

## 2015-03-21 ENCOUNTER — Encounter: Payer: Self-pay | Admitting: Family Medicine

## 2015-03-21 ENCOUNTER — Ambulatory Visit (INDEPENDENT_AMBULATORY_CARE_PROVIDER_SITE_OTHER): Payer: Medicare Other | Admitting: Family Medicine

## 2015-03-21 VITALS — BP 120/68 | HR 57 | Temp 98.2°F | Ht 61.0 in | Wt 199.0 lb

## 2015-03-21 DIAGNOSIS — R1084 Generalized abdominal pain: Secondary | ICD-10-CM

## 2015-03-21 DIAGNOSIS — K439 Ventral hernia without obstruction or gangrene: Secondary | ICD-10-CM

## 2015-03-21 DIAGNOSIS — R110 Nausea: Secondary | ICD-10-CM

## 2015-03-21 MED ORDER — HYDROCODONE-ACETAMINOPHEN 5-325 MG PO TABS: ORAL_TABLET | ORAL | Status: DC

## 2015-03-21 NOTE — Patient Instructions (Signed)
The ventral hernia report was reviewed with the patient and it was decided because of the severe nausea and discomfort she was having that she will see the gastroenterologist and an appointment will also be made with a surgeon

## 2015-03-21 NOTE — Progress Notes (Signed)
Subjective:    Patient ID: Selena Hunt, female    DOB: February 07, 1951, 64 y.o.   MRN: [id number]  HPI Patient here today for 2 week follow up on abdominal pain. She states it is about the same and she has bad nausea in the mornings. The nausea seems to be getting worse as well as the discomfort. The recent ultrasound and CT scan results were reviewed with the patient in detail today     Patient Active Problem List   Diagnosis Date Noted  . Vitamin D deficiency 09/13/2013  . Chest pain 09/04/2013  . Metabolic syndrome [id number]  . Migraine headache, history of   . Lumbosacral spondylosis without myelopathy   . Diverticulitis of colon (without mention of hemorrhage)   . Obesity   . Fatigue, chronic   . Female stress incontinence   . Hypertension   . Hematuria   . Hyperlipidemia   . Benign neoplasm of colon   . Stricture and stenosis of esophagus    Outpatient Encounter Prescriptions as of 03/21/2015  Medication Sig  . calcium-vitamin D (OSCAL WITH D) 500-200 MG-UNIT per tablet Take 1 tablet by mouth daily.  . fluticasone (FLONASE) 50 MCG/ACT nasal spray Place 2 sprays into both nostrils daily.  . hydrochlorothiazide (MICROZIDE) 12.5 MG capsule Take 1 capsule (12.5 mg total) by mouth daily.  Marland Kitchen HYDROcodone-acetaminophen (NORCO/VICODIN) 5-325 MG per tablet 1/2 to a whole tab BID PRN  . metoprolol (LOPRESSOR) 50 MG tablet TAKE 1 TABLET BY MOUTH EVERY DAY  . multivitamin-iron-minerals-folic acid (CENTRUM) chewable tablet Chew 1 tablet by mouth daily.    Marland Kitchen NEXIUM 40 MG capsule TAKE 1 CAPSULE BY MOUTH TWICE DAILY BEFORE BREAKFAST AND SUPPER  . oxybutynin (DITROPAN XL) 10 MG 24 hr tablet Take 1 tablet (10 mg total) by mouth at bedtime.  Marland Kitchen SYNTHROID 100 MCG tablet TAKE 1/2 TABLET BY MOUTH EVERY DAY BEFORE BREAKFAST EXCEPT ON MONDAYS AND FRIDAYS, THEN TAKE 1 TABLET BY MOUTH BEFORE BREAKFAST  . Vitamin D, Ergocalciferol, (DRISDOL) 50000 UNITS CAPS capsule Take 1 capsule (50,000 Units total)  by mouth every 7 (seven) days.   No facility-administered encounter medications on file as of 03/21/2015.      Review of Systems  Constitutional: Negative.   HENT: Negative.   Eyes: Negative.   Respiratory: Negative.   Cardiovascular: Negative.   Gastrointestinal: Positive for nausea (am) and abdominal pain.  Endocrine: Negative.   Genitourinary: Negative.   Musculoskeletal: Negative.   Skin: Negative.   Allergic/Immunologic: Negative.   Neurological: Negative.   Hematological: Negative.   Psychiatric/Behavioral: Negative.        Objective:   Physical Exam  Constitutional: She is oriented to person, place, and time. She appears well-developed and well-nourished. No distress.  Eyes: Conjunctivae and EOM are normal. Pupils are equal, round, and reactive to light. Right eye exhibits no discharge. Left eye exhibits no discharge. No scleral icterus.  Abdominal: Soft. She exhibits distension. There is tenderness. There is no rebound and no guarding.  Musculoskeletal: Normal range of motion.  Neurological: She is alert and oriented to person, place, and time.  Skin: Skin is warm and dry. No rash noted.  Psychiatric: She has a normal mood and affect. Her behavior is normal. Judgment and thought content normal.  Nursing note and vitals reviewed.   BP 120/68 mmHg  Pulse 57  Temp(Src) 98.2 F (36.8 C) (Oral)  Ht 5\' 1"  (1.549 m)  Wt 199 lb (90.266 kg)  BMI 37.62 kg/m2  Assessment & Plan:  1. Ventral hernia without obstruction or gangrene -The CT scan and ultrasound of the abdomen were reviewed with the patient - Ambulatory referral to Gastroenterology - Ambulatory referral to General Surgery  2. Generalized abdominal pain -Because of the patient's symptoms she will be referred to the gastroenterologist and a referral to Gen. surgery also. - Ambulatory referral to Gastroenterology - Ambulatory referral to General Surgery  3. Nausea without vomiting -She should  continue with the proton pump inhibitors even though they're not helping that much. We will get a referral in as soon as possible. - Ambulatory referral to Gastroenterology - Ambulatory referral to General Surgery  Patient Instructions  The ventral hernia report was reviewed with the patient and it was decided because of the severe nausea and discomfort she was having that she will see the gastroenterologist and an appointment will also be made with a surgeon   Arrie Senate MD

## 2015-03-22 ENCOUNTER — Encounter: Payer: Self-pay | Admitting: Gastroenterology

## 2015-03-22 ENCOUNTER — Ambulatory Visit (INDEPENDENT_AMBULATORY_CARE_PROVIDER_SITE_OTHER): Payer: Medicare Other | Admitting: Gastroenterology

## 2015-03-22 VITALS — BP 148/88 | HR 56 | Ht 60.5 in | Wt 198.5 lb

## 2015-03-22 DIAGNOSIS — R109 Unspecified abdominal pain: Secondary | ICD-10-CM | POA: Insufficient documentation

## 2015-03-22 DIAGNOSIS — K439 Ventral hernia without obstruction or gangrene: Secondary | ICD-10-CM | POA: Diagnosis not present

## 2015-03-22 NOTE — Patient Instructions (Signed)
Follow up as needed

## 2015-03-22 NOTE — Progress Notes (Signed)
03/22/2015 Selena Hunt [id number] Feb 11, 1951   HISTORY OF PRESENT ILLNESS:  This is a 64 year old female who is previously known to Dr. Velora Heckler. Her last colonoscopy was in January 2008 at which time she was found have only diverticulosis. She has history of colon polyp on a previous colonoscopy, but polyp was hyperplastic. Her next colonoscopy is due January 2018. She also had an EGD in January 2008 that showed only a hiatal hernia. She presents to our office today at the request of her PCP, Dr. Redge Gainer, for evaluation regarding abdominal pain. She has history of multiple abdominal surgeries including partial colon resection for diverticular disease in 1997 and ventral hernia repair with mesh in the past as well. Her abdominal pain has been bothering her for the past 2 months. She describes it as a burning, tearing pain in her mid abdomen over the area of recurrent hernia. She says it comes and goes but some days it hurts all day. There is some nausea associated with it, but no vomiting. She is having formed light brown colored stools without blood. Recent CT scan of the abdomen and pelvis with and without contrast showed ventral hernia containing omental fat, but was otherwise unremarkable for any causes of pain. Recent CBC, BMP, and hepatic function panel within normal limits. She does take Nexium daily for upper GI issues, but that has continued to work well for her and she is otherwise asymptomatic. She denies any NSAID use.   Past Medical History  Diagnosis Date  . Hernia of unspecified site of abdominal cavity without mention of obstruction or gangrene   . Migraine headache   . Lumbosacral spondylosis without myelopathy   . Diverticulosis of colon (without mention of hemorrhage)   . Diverticulitis of colon (without mention of hemorrhage)   . Obesity   . Fatigue   . Female stress incontinence   . Essential hypertension, benign   . Hematuria   . Other and unspecified  hyperlipidemia   . Benign neoplasm of colon   . Barrett's esophagus   . Stricture and stenosis of esophagus   . Blood transfusion without reported diagnosis   . GERD (gastroesophageal reflux disease)   . Chronic kidney disease   . Seizures ([place])   . Thyroid disease   . Pulmonary embolism ([place])   . Ventral hernia   . Hiatal hernia   . Pneumonia   . Colon polyps    Past Surgical History  Procedure Laterality Date  . Abdominal hysterectomy  1990  . Lumbar fusion  1995    Fusion L4 - L5  . Colon surgery  1997    Removed  . Tubal ligation  1972  . Femur fracture surgery Left   . Hip fracture surgery Left   . Ulnar nerve repair Left   . Tendon transfer    . Humerus fracture surgery Left   . Femoral varus osteotomy w/ adductor release and iliac crest bone graft, pelvic osteotomy    . Partial colectomy      Secondary to diverticulitis  . Hernia repair      with mesh, abdominal    reports that she has never smoked. She has never used smokeless tobacco. She reports that she drinks alcohol. She reports that she does not use illicit drugs. family history includes Alzheimer's disease in her father and mother; Hypertension in her mother. Allergies  Allergen Reactions  . Ambien [Zolpidem Tartrate] Anxiety  . Diovan [Valsartan] Other (See Comments)  Seizures.  . Wellbutrin [Bupropion] Other (See Comments)    Seizures.  . Codeine   . Feldene [Piroxicam]   . Guaifenesin Er   . Latex   . Lipitor [Atorvastatin Calcium]   . Mobic [Meloxicam] Other (See Comments)    Stomach cramps  . Neurontin [Gabapentin]   . Zocor [Simvastatin]   . Livalo [Pitavastatin] Swelling      Outpatient Encounter Prescriptions as of 03/22/2015  Medication Sig  . calcium-vitamin D (OSCAL WITH D) 500-200 MG-UNIT per tablet Take 1 tablet by mouth as needed.   . Esomeprazole Magnesium (NEXIUM 24HR PO) Take 2 tablets by mouth daily.  . fluticasone (FLONASE) 50 MCG/ACT nasal spray Place 2 sprays into both  nostrils daily. (Patient taking differently: Place 2 sprays into both nostrils as needed. )  . hydrochlorothiazide (MICROZIDE) 12.5 MG capsule Take 1 capsule (12.5 mg total) by mouth daily.  Marland Kitchen HYDROcodone-acetaminophen (NORCO/VICODIN) 5-325 MG tablet 1/2 to a whole tab BID PRN  . metoprolol (LOPRESSOR) 50 MG tablet TAKE 1 TABLET BY MOUTH EVERY DAY  . multivitamin-iron-minerals-folic acid (CENTRUM) chewable tablet Chew 1 tablet by mouth daily.    Marland Kitchen oxybutynin (DITROPAN XL) 10 MG 24 hr tablet Take 1 tablet (10 mg total) by mouth at bedtime.  Marland Kitchen SYNTHROID 100 MCG tablet TAKE 1/2 TABLET BY MOUTH EVERY DAY BEFORE BREAKFAST EXCEPT ON MONDAYS AND FRIDAYS, THEN TAKE 1 TABLET BY MOUTH BEFORE BREAKFAST  . Vitamin D, Ergocalciferol, (DRISDOL) 50000 UNITS CAPS capsule Take 1 capsule (50,000 Units total) by mouth every 7 (seven) days.  . [DISCONTINUED] NEXIUM 40 MG capsule TAKE 1 CAPSULE BY MOUTH TWICE DAILY BEFORE BREAKFAST AND SUPPER   No facility-administered encounter medications on file as of 03/22/2015.   REVIEW OF SYSTEMS  : All other systems reviewed and negative except where noted in the History of Present Illness.  PHYSICAL EXAM: BP 148/88 mmHg  Pulse 56  Ht 5' 0.5" (1.537 m)  Wt 198 lb 8 oz (90.039 kg)  BMI 38.11 kg/m2 General: Well developed white female in no acute distress Head: Normocephalic and atraumatic Eyes:  Sclerae anicteric, conjunctiva pink. Ears: Normal auditory acuity Lungs: Clear throughout to auscultation Heart: Slightly bradycardic.  No murmurs. Abdomen: Soft, non-distended.  Normal bowel sounds.  Previous laparotomy scar noted.  Ventral hernia noted.  Somewhat tender over hernia in mid-abdomen. Musculoskeletal: Symmetrical with no gross deformities  Skin: No lesions on visible extremities Extremities: No edema  Neurological: Alert oriented x 4, grossly non-focal Psychological:  Alert and cooperative. Normal mood and affect  ASSESSMENT AND PLAN: -64 year old female  with multiple abdominal surgeries including previous ventral hernia repair with mesh. Now with midabdominal pain that seems to be possibly related to recurrent hernia and/or adhesions. At this point I do not think that there is any need for endoscopic procedures. She is going to be scheduled to see the surgeon, Dr. Ninfa Linden, for evaluation as well. Unless he specifically needs/wants a procedure done then we will hold off at this time.  CC:  Chipper Herb, MD

## 2015-03-23 NOTE — Progress Notes (Signed)
Reviewed and agree with management plan.  Siennah Barrasso T. Samayra Hebel, MD FACG 

## 2015-04-14 ENCOUNTER — Other Ambulatory Visit: Payer: Self-pay | Admitting: Family Medicine

## 2015-04-17 DIAGNOSIS — Z86711 Personal history of pulmonary embolism: Secondary | ICD-10-CM | POA: Diagnosis not present

## 2015-04-17 DIAGNOSIS — Z90710 Acquired absence of both cervix and uterus: Secondary | ICD-10-CM | POA: Diagnosis not present

## 2015-04-17 DIAGNOSIS — K432 Incisional hernia without obstruction or gangrene: Secondary | ICD-10-CM | POA: Diagnosis not present

## 2015-04-17 DIAGNOSIS — K219 Gastro-esophageal reflux disease without esophagitis: Secondary | ICD-10-CM | POA: Diagnosis not present

## 2015-04-17 DIAGNOSIS — Z87898 Personal history of other specified conditions: Secondary | ICD-10-CM | POA: Diagnosis not present

## 2015-04-17 DIAGNOSIS — I10 Essential (primary) hypertension: Secondary | ICD-10-CM | POA: Diagnosis not present

## 2015-04-17 DIAGNOSIS — Z9889 Other specified postprocedural states: Secondary | ICD-10-CM | POA: Diagnosis not present

## 2015-04-17 DIAGNOSIS — Z8719 Personal history of other diseases of the digestive system: Secondary | ICD-10-CM | POA: Diagnosis not present

## 2015-04-17 DIAGNOSIS — Z789 Other specified health status: Secondary | ICD-10-CM | POA: Diagnosis not present

## 2015-04-17 DIAGNOSIS — Z6838 Body mass index (BMI) 38.0-38.9, adult: Secondary | ICD-10-CM | POA: Diagnosis not present

## 2015-04-17 NOTE — Telephone Encounter (Signed)
Last seen 03/21/15  DWM  Last Vit D 03/06/15  31.3

## 2015-06-06 ENCOUNTER — Telehealth: Payer: Self-pay | Admitting: Family Medicine

## 2015-06-06 NOTE — Telephone Encounter (Signed)
Pt was advised to monitor and if it continues to happen to CB and schedule an appt. Pt voiced understanding.

## 2015-06-06 NOTE — Telephone Encounter (Signed)
Stp and she states the other day while walking she felt her heart pounding and her wrists started hurting but after resting and sitting for a short period of time her heart wasn't pounding and the pain in wrists stopped.

## 2015-06-09 ENCOUNTER — Other Ambulatory Visit: Payer: Self-pay | Admitting: Family Medicine

## 2015-07-10 ENCOUNTER — Other Ambulatory Visit: Payer: Self-pay | Admitting: Family Medicine

## 2015-07-17 ENCOUNTER — Encounter: Payer: Self-pay | Admitting: *Deleted

## 2015-07-17 ENCOUNTER — Ambulatory Visit (INDEPENDENT_AMBULATORY_CARE_PROVIDER_SITE_OTHER): Payer: Medicare Other | Admitting: Family Medicine

## 2015-07-17 ENCOUNTER — Encounter: Payer: Self-pay | Admitting: Family Medicine

## 2015-07-17 ENCOUNTER — Encounter (INDEPENDENT_AMBULATORY_CARE_PROVIDER_SITE_OTHER): Payer: Self-pay

## 2015-07-17 VITALS — BP 103/67 | HR 80 | Temp 97.0°F | Ht 60.5 in | Wt 193.0 lb

## 2015-07-17 DIAGNOSIS — E8881 Metabolic syndrome: Secondary | ICD-10-CM | POA: Diagnosis not present

## 2015-07-17 DIAGNOSIS — E559 Vitamin D deficiency, unspecified: Secondary | ICD-10-CM

## 2015-07-17 DIAGNOSIS — E785 Hyperlipidemia, unspecified: Secondary | ICD-10-CM

## 2015-07-17 DIAGNOSIS — K22719 Barrett's esophagus with dysplasia, unspecified: Secondary | ICD-10-CM | POA: Diagnosis not present

## 2015-07-17 DIAGNOSIS — M47817 Spondylosis without myelopathy or radiculopathy, lumbosacral region: Secondary | ICD-10-CM

## 2015-07-17 DIAGNOSIS — E039 Hypothyroidism, unspecified: Secondary | ICD-10-CM | POA: Diagnosis not present

## 2015-07-17 DIAGNOSIS — K432 Incisional hernia without obstruction or gangrene: Secondary | ICD-10-CM | POA: Diagnosis not present

## 2015-07-17 DIAGNOSIS — I10 Essential (primary) hypertension: Secondary | ICD-10-CM

## 2015-07-17 DIAGNOSIS — R002 Palpitations: Secondary | ICD-10-CM | POA: Diagnosis not present

## 2015-07-17 MED ORDER — HYDROCODONE-ACETAMINOPHEN 5-325 MG PO TABS: ORAL_TABLET | ORAL | Status: DC

## 2015-07-17 NOTE — Patient Instructions (Addendum)
Medicare Annual Wellness Visit  [facility] and the medical providers at [facility] strive to bring you the best medical care.  In doing so we not only want to address your current medical conditions and concerns but also to detect new conditions early and prevent illness, disease and health-related problems.    Medicare offers a yearly Wellness Visit which allows our clinical staff to assess your need for preventative services including immunizations, lifestyle education, counseling to decrease risk of preventable diseases and screening for fall risk and other medical concerns.    This visit is provided free of charge (no copay) for all Medicare recipients. The clinical pharmacists at [facility] have begun to conduct these Wellness Visits which will also include a thorough review of all your medications.    As you primary medical provider recommend that you make an appointment for your Annual Wellness Visit if you have not done so already this year.  You may set up this appointment before you leave today or you may call back [phone] and schedule an appointment.  Please make sure when you call that you mention that you are scheduling your Annual Wellness Visit with the clinical pharmacist so that the appointment may be made for the proper length of time.     Continue current medications. Continue good therapeutic lifestyle changes which include good diet and exercise. Fall precautions discussed with patient. If an FOBT was given today- please return it to our front desk. If you are over 33 years old - you may need Prevnar 61 or the adult Pneumonia vaccine.  **Flu shots are available--- please call and schedule a FLU-CLINIC appointment**  After your visit with [place] today you will receive a survey in the mail or online from [employer] regarding your care with [place]. Please take a moment to fill this out. Your feedback is very  important to [place] as you can help [place] better understand your patient needs as well as improve your experience and satisfaction. WE CARE ABOUT YOU!!!   Because of this episode of pounding with the chest and wrist pain that lasted 30 minutes along with shortness of breath we will arrange for you to see the cardiologist for further evaluation. Continue with current medicines Use some cortisone 10 on the back Continue to work on weight and follow-up with the surgeon regarding the incisional hernia repair

## 2015-07-17 NOTE — Addendum Note (Signed)
Addended by: Zannie Cove on: 07/17/2015 11:34 AM   Modules accepted: Orders
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
## 2015-07-18 LAB — BMP8+EGFR
BUN/Creatinine Ratio: 26 (ref 12–28)
BUN: 28 mg/dL — AB (ref 8–27)
CALCIUM: 9.7 mg/dL (ref 8.7–10.3)
CO2: 25 mmol/L (ref 18–29)
CREATININE: 1.09 mg/dL — AB (ref 0.57–1.00)
Chloride: 100 mmol/L (ref 96–106)
GFR, EST AFRICAN AMERICAN: 61 mL/min/{1.73_m2} (ref >59)
GFR, EST NON AFRICAN AMERICAN: 53 mL/min/{1.73_m2} — AB (ref >59)
Glucose: 104 mg/dL — ABNORMAL HIGH (ref 65–99)
Potassium: 4.1 mmol/L (ref 3.5–5.2)
Sodium: 143 mmol/L (ref 134–144)

## 2015-07-18 LAB — HEPATIC FUNCTION PANEL
ALBUMIN: 4.4 g/dL (ref 3.6–4.8)
ALK PHOS: 93 IU/L (ref 39–117)
ALT: 20 IU/L (ref 0–32)
AST: 19 IU/L (ref 0–40)
BILIRUBIN TOTAL: 0.4 mg/dL (ref 0.0–1.2)
BILIRUBIN, DIRECT: 0.12 mg/dL (ref 0.00–0.40)
Total Protein: 6.4 g/dL (ref 6.0–8.5)

## 2015-07-18 LAB — CBC WITH DIFFERENTIAL/PLATELET
Basophils Absolute: 0 10*3/uL (ref 0.0–0.2)
Basos: 1 %
EOS (ABSOLUTE): 0.3 10*3/uL (ref 0.0–0.4)
EOS: 7 %
HEMATOCRIT: 45.4 % (ref 34.0–46.6)
HEMOGLOBIN: 15.3 g/dL (ref 11.1–15.9)
IMMATURE GRANULOCYTES: 0 %
Immature Grans (Abs): 0 10*3/uL (ref 0.0–0.1)
LYMPHS ABS: 1.3 10*3/uL (ref 0.7–3.1)
Lymphs: 29 %
MCH: 28.4 pg (ref 26.6–33.0)
MCHC: 33.7 g/dL (ref 31.5–35.7)
MCV: 84 fL (ref 79–97)
MONOCYTES: 9 %
Monocytes Absolute: 0.4 10*3/uL (ref 0.1–0.9)
NEUTROS PCT: 54 %
Neutrophils Absolute: 2.4 10*3/uL (ref 1.4–7.0)
Platelets: 202 10*3/uL (ref 150–379)
RBC: 5.38 x10E6/uL — AB (ref 3.77–5.28)
RDW: 14.3 % (ref 12.3–15.4)
WBC: 4.4 10*3/uL (ref 3.4–10.8)

## 2015-07-18 LAB — THYROID PANEL WITH TSH
Free Thyroxine Index: 2.4 (ref 1.2–4.9)
T3 Uptake Ratio: 28 % (ref 24–39)
T4, Total: 8.5 ug/dL (ref 4.5–12.0)
TSH: 2.58 u[IU]/mL (ref 0.450–4.500)

## 2015-07-18 LAB — NMR, LIPOPROFILE
Cholesterol: 232 mg/dL — ABNORMAL HIGH (ref 100–199)
HDL CHOLESTEROL BY NMR: 54 mg/dL (ref >39)
HDL PARTICLE NUMBER: 35 umol/L (ref >=30.5)
LDL Particle Number: 1929 nmol/L — ABNORMAL HIGH (ref <1000)
LDL SIZE: 21.1 nm (ref >20.5)
LDL-C: 157 mg/dL — ABNORMAL HIGH (ref 0–99)
LP-IR SCORE: 50 — AB (ref <=45)
SMALL LDL PARTICLE NUMBER: 504 nmol/L (ref <=527)
Triglycerides by NMR: 105 mg/dL (ref 0–149)

## 2015-07-18 LAB — VITAMIN D 25 HYDROXY (VIT D DEFICIENCY, FRACTURES): Vit D, 25-Hydroxy: 41.8 ng/mL (ref 30.0–100.0)

## 2015-07-28 DIAGNOSIS — Z87898 Personal history of other specified conditions: Secondary | ICD-10-CM | POA: Diagnosis not present

## 2015-07-28 DIAGNOSIS — Z8719 Personal history of other diseases of the digestive system: Secondary | ICD-10-CM | POA: Diagnosis not present

## 2015-07-28 DIAGNOSIS — Z6838 Body mass index (BMI) 38.0-38.9, adult: Secondary | ICD-10-CM | POA: Diagnosis not present

## 2015-07-28 DIAGNOSIS — Z86711 Personal history of pulmonary embolism: Secondary | ICD-10-CM | POA: Diagnosis not present

## 2015-07-28 DIAGNOSIS — R933 Abnormal findings on diagnostic imaging of other parts of digestive tract: Secondary | ICD-10-CM | POA: Diagnosis not present

## 2015-07-28 DIAGNOSIS — Z9889 Other specified postprocedural states: Secondary | ICD-10-CM | POA: Diagnosis not present

## 2015-07-28 DIAGNOSIS — Z90710 Acquired absence of both cervix and uterus: Secondary | ICD-10-CM | POA: Diagnosis not present

## 2015-08-07 ENCOUNTER — Encounter: Payer: Self-pay | Admitting: Pharmacist

## 2015-08-07 ENCOUNTER — Ambulatory Visit (INDEPENDENT_AMBULATORY_CARE_PROVIDER_SITE_OTHER): Payer: Medicare Other | Admitting: Pharmacist

## 2015-08-07 VITALS — BP 110/68 | HR 82 | Ht 60.5 in | Wt 196.8 lb

## 2015-08-07 DIAGNOSIS — E785 Hyperlipidemia, unspecified: Secondary | ICD-10-CM

## 2015-08-07 MED ORDER — PRAVASTATIN SODIUM 20 MG PO TABS: 20.0000 mg | ORAL_TABLET | Freq: Every day | ORAL | Status: DC
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
## 2015-09-05 DIAGNOSIS — H9042 Sensorineural hearing loss, unilateral, left ear, with unrestricted hearing on the contralateral side: Secondary | ICD-10-CM | POA: Diagnosis not present

## 2015-09-05 DIAGNOSIS — H8103 Meniere's disease, bilateral: Secondary | ICD-10-CM | POA: Diagnosis not present

## 2015-09-05 DIAGNOSIS — H9311 Tinnitus, right ear: Secondary | ICD-10-CM | POA: Diagnosis not present

## 2015-09-05 DIAGNOSIS — H9041 Sensorineural hearing loss, unilateral, right ear, with unrestricted hearing on the contralateral side: Secondary | ICD-10-CM | POA: Diagnosis not present

## 2015-09-06 ENCOUNTER — Encounter: Payer: Self-pay | Admitting: Cardiology

## 2015-09-06 ENCOUNTER — Ambulatory Visit (INDEPENDENT_AMBULATORY_CARE_PROVIDER_SITE_OTHER): Payer: Medicare Other | Admitting: Cardiology

## 2015-09-06 VITALS — BP 134/79 | HR 66 | Ht 61.5 in | Wt 198.0 lb

## 2015-09-06 DIAGNOSIS — R0602 Shortness of breath: Secondary | ICD-10-CM | POA: Diagnosis not present
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
## 2015-09-06 NOTE — Patient Instructions (Signed)
Medication Instructions:  The current medical regimen is effective;  continue present plan and medications.  Testing/Procedures: Your physician has requested that you have an exercise tolerance test. For further information please visit [email or web address]. Please also follow instruction sheet, as given.  Follow-Up: Further follow up will be based on the results of your treadmill testing.  Thank you for choosing [facility]!!

## 2015-09-07 ENCOUNTER — Telehealth (HOSPITAL_COMMUNITY): Payer: Self-pay | Admitting: *Deleted

## 2015-09-07 ENCOUNTER — Other Ambulatory Visit: Payer: Self-pay | Admitting: Family Medicine

## 2015-09-07 ENCOUNTER — Telehealth: Payer: Self-pay | Admitting: Cardiology

## 2015-09-07 DIAGNOSIS — I10 Essential (primary) hypertension: Secondary | ICD-10-CM

## 2015-09-07 DIAGNOSIS — R079 Chest pain, unspecified: Secondary | ICD-10-CM

## 2015-09-07 NOTE — Telephone Encounter (Signed)
Patient given detailed instructions per Myocardial Perfusion Study Information Sheet for the test on 09/12/15 at 7:30. Patient notified to arrive 15 minutes early and that it is imperative to arrive on time for appointment to keep from having the test rescheduled.  If you need to cancel or reschedule your appointment, please call the office within 24 hours of your appointment. Failure to do so may result in a cancellation of your appointment, and a $50 no show fee. Patient verbalized understanding.Selena Hunt

## 2015-09-07 NOTE — Telephone Encounter (Signed)
Pt aware test will be changed to a [employer].  I reviewed instruction of testing with her and she states understanding.  She is aware someone will call her to reschedule her appt time and date.

## 2015-09-07 NOTE — Telephone Encounter (Signed)
New Message  Pt wanted to do the Stress test withOUT the treadmill- pt appt for [facility] for 6/9 has been cancelled. Pt requested to discuss w/ RN.Please call back and discuss.

## 2015-09-07 NOTE — Telephone Encounter (Signed)
OK to change to Lexiscan Myoview. 

## 2015-09-07 NOTE — Telephone Encounter (Signed)
Returned call to pt. She saw Dr Percival Spanish yesterday in [place] where they discussed pt have POET vs pharmacological stress test. Pt is calling back to let us know she is concerned about her physical limitations in performing the treadmill test. Pt and her family are afraid she will hurt herself by doing the treadmill test. Pt is requesting for Dr Percival Spanish to order the pharmacological stress test instead.  Will route to Dr Percival Spanish for his recommendation.

## 2015-09-11 ENCOUNTER — Other Ambulatory Visit: Payer: Self-pay | Admitting: Family Medicine

## 2015-09-12 ENCOUNTER — Ambulatory Visit (HOSPITAL_COMMUNITY): Payer: Medicare Other | Attending: Cardiology

## 2015-09-12 DIAGNOSIS — R079 Chest pain, unspecified: Secondary | ICD-10-CM | POA: Insufficient documentation

## 2015-09-12 DIAGNOSIS — R0602 Shortness of breath: Secondary | ICD-10-CM | POA: Insufficient documentation

## 2015-09-12 DIAGNOSIS — I10 Essential (primary) hypertension: Secondary | ICD-10-CM | POA: Diagnosis not present

## 2015-09-12 LAB — MYOCARDIAL PERFUSION IMAGING
CHL CUP NUCLEAR SRS: 0
CHL CUP NUCLEAR SSS: 6
CHL CUP RESTING HR STRESS: 57 {beats}/min
CSEPPHR: 100 {beats}/min
LV sys vol: 24 mL
LVDIAVOL: 66 mL (ref 46–106)
NUC STRESS TID: 0.87
RATE: 0.36
SDS: 6

## 2015-09-12 MED ORDER — TECHNETIUM TC 99M TETROFOSMIN IV KIT
33.0000 | PACK | Freq: Once | INTRAVENOUS | Status: AC | PRN
  Administered 2015-09-12: 33 via INTRAVENOUS
  Filled 2015-09-12: qty 33

## 2015-09-12 MED ORDER — TECHNETIUM TC 99M TETROFOSMIN IV KIT
10.3000 | PACK | Freq: Once | INTRAVENOUS | Status: AC | PRN
  Administered 2015-09-12: 10 via INTRAVENOUS
  Filled 2015-09-12: qty 10

## 2015-09-12 MED ORDER — REGADENOSON 0.4 MG/5ML IV SOLN
0.4000 mg | Freq: Once | INTRAVENOUS | Status: AC
  Administered 2015-09-12: 0.4 mg via INTRAVENOUS

## 2015-09-19 DIAGNOSIS — H903 Sensorineural hearing loss, bilateral: Secondary | ICD-10-CM | POA: Diagnosis not present

## 2015-09-19 DIAGNOSIS — H9311 Tinnitus, right ear: Secondary | ICD-10-CM | POA: Diagnosis not present

## 2015-09-19 DIAGNOSIS — H8103 Meniere's disease, bilateral: Secondary | ICD-10-CM | POA: Diagnosis not present

## 2015-09-19 DIAGNOSIS — H8141 Vertigo of central origin, right ear: Secondary | ICD-10-CM | POA: Diagnosis not present

## 2015-10-09 ENCOUNTER — Other Ambulatory Visit: Payer: Self-pay | Admitting: Pharmacist

## 2015-10-09 ENCOUNTER — Other Ambulatory Visit: Payer: Self-pay | Admitting: Family Medicine

## 2015-12-04 ENCOUNTER — Ambulatory Visit (INDEPENDENT_AMBULATORY_CARE_PROVIDER_SITE_OTHER): Payer: Medicare Other | Admitting: Family Medicine

## 2015-12-04 ENCOUNTER — Other Ambulatory Visit: Payer: Self-pay | Admitting: Family Medicine

## 2015-12-04 ENCOUNTER — Encounter: Payer: Self-pay | Admitting: Family Medicine

## 2015-12-04 VITALS — BP 93/59 | HR 55 | Temp 97.4°F | Ht 61.5 in | Wt 195.0 lb

## 2015-12-04 DIAGNOSIS — M79652 Pain in left thigh: Secondary | ICD-10-CM | POA: Diagnosis not present

## 2015-12-04 DIAGNOSIS — E8881 Metabolic syndrome: Secondary | ICD-10-CM | POA: Diagnosis not present

## 2015-12-04 DIAGNOSIS — E039 Hypothyroidism, unspecified: Secondary | ICD-10-CM

## 2015-12-04 DIAGNOSIS — M2352 Chronic instability of knee, left knee: Secondary | ICD-10-CM

## 2015-12-04 DIAGNOSIS — K22719 Barrett's esophagus with dysplasia, unspecified: Secondary | ICD-10-CM | POA: Diagnosis not present

## 2015-12-04 DIAGNOSIS — I10 Essential (primary) hypertension: Secondary | ICD-10-CM

## 2015-12-04 DIAGNOSIS — E559 Vitamin D deficiency, unspecified: Secondary | ICD-10-CM

## 2015-12-04 DIAGNOSIS — E785 Hyperlipidemia, unspecified: Secondary | ICD-10-CM

## 2015-12-04 MED ORDER — ESOMEPRAZOLE MAGNESIUM 40 MG PO CPDR: 40.0000 mg | DELAYED_RELEASE_CAPSULE | Freq: Two times a day (BID) | ORAL | 3 refills | Status: DC

## 2015-12-04 MED ORDER — HYDROCODONE-ACETAMINOPHEN 5-325 MG PO TABS: ORAL_TABLET | ORAL | 0 refills | Status: DC

## 2015-12-04 NOTE — Patient Instructions (Addendum)
Medicare Annual Wellness Visit  [facility] and the medical providers at [place] strive to bring you the best medical care.  In doing so we not only want to address your current medical conditions and concerns but also to detect new conditions early and prevent illness, disease and health-related problems.    Medicare offers a yearly Wellness Visit which allows our clinical staff to assess your need for preventative services including immunizations, lifestyle education, counseling to decrease risk of preventable diseases and screening for fall risk and other medical concerns.    This visit is provided free of charge (no copay) for all Medicare recipients. The clinical pharmacists at [facility] have begun to conduct these Wellness Visits which will also include a thorough review of all your medications.    As you primary medical provider recommend that you make an appointment for your Annual Wellness Visit if you have not done so already this year.  You may set up this appointment before you leave today or you may call back [phone] and schedule an appointment.  Please make sure when you call that you mention that you are scheduling your Annual Wellness Visit with the clinical pharmacist so that the appointment may be made for the proper length of time.     Continue current medications. Continue good therapeutic lifestyle changes which include good diet and exercise. Fall precautions discussed with patient. If an FOBT was given today- please return it to our front desk. If you are over 42 years old - you may need Prevnar 26 or the adult Pneumonia vaccine.   After your visit with [place] today you will receive a survey in the mail or online from [employer] regarding your care with [place]. Please take a moment to fill this out. Your feedback is very important to [place] as you can help [place] better understand your patient needs as well as  improve your experience and satisfaction. WE CARE ABOUT YOU!!!   It is important that you arrange for a follow-up appointment with the orthopedic surgeons that her for me with you at [facility]. If you have problems making this appointment please call us back Please return to the office fasting for lab work If abdominal pain recurs call us and we will get an appointment with the surgeon that operated on you originally Continue to work on weight loss through diet and exercise as much as possible

## 2015-12-04 NOTE — Progress Notes (Signed)
Subjective:    Patient ID: Selena Hunt, female    DOB: 11-26-1950, 65 y.o.   MRN: [id number]  HPI Pt here for follow up and management of chronic medical problems which includes hypothyroid, hypertension and hyperlipidemia. She is taking medications regularly.The patient today comes in for her regular appointment. She complains only of having dry skin. She has multiple diagnoses. The patient is doing well overall. She is tolerating a low dose of pravastatin. She is having continued problems with her left hip and thigh and knee feeling like the knee is going to give way especially after certain motions and activities. She plans to make an appointment with orthopedic surgeon at [facility] for follow-up of this. She did go see the surgeon regarding her incisional hernia and he felt that there was nothing that he would do about it at this time. She is currently not having any trouble with abdominal pain. She understands that if the pain comes back and we will do another referral back to the original surgeon Dr. Rush Farmer. She denies any chest pain or shortness of breath. She denies any trouble with nausea vomiting diarrhea or blood in the stool. Since trying ranitidine over-the-counter she has had increased problems with reflux and acid. She is going to go back on her Nexium as she just does not tolerate the reflux well. She denies any trouble with passing her water. She admits to having very dry skin and skin sensitivities. She is going to retry some emollients over-the-counter especially those that can be used after taking a shower.      Patient Active Problem List   Diagnosis Date Noted  . AP (abdominal pain) 03/22/2015  . Ventral hernia without obstruction or gangrene 03/22/2015  . Vitamin D deficiency 09/13/2013  . Chest pain 09/04/2013  . Metabolic syndrome 75/17/0017  . Migraine headache, history of   . Lumbosacral spondylosis without myelopathy   .  Diverticulitis of colon (without mention of hemorrhage)   . Obesity   . Fatigue, chronic   . Female stress incontinence   . Hypertension   . Hematuria   . Hyperlipidemia   . Benign neoplasm of colon   . Stricture and stenosis of esophagus    Outpatient Encounter Prescriptions as of 12/04/2015  Medication Sig  . calcium-vitamin D (OSCAL WITH D) 500-200 MG-UNIT per tablet Take 1 tablet by mouth daily with breakfast.   . Esomeprazole Magnesium (NEXIUM 24HR PO) Take 2 tablets by mouth daily.  . fluticasone (FLONASE) 50 MCG/ACT nasal spray Place 2 sprays into both nostrils daily. (Patient taking differently: Place 2 sprays into both nostrils as needed. )  . hydrochlorothiazide (MICROZIDE) 12.5 MG capsule Take 1 capsule (12.5 mg total) by mouth daily.  Marland Kitchen HYDROcodone-acetaminophen (NORCO/VICODIN) 5-325 MG tablet 1/2 to a whole tab BID PRN  . metoprolol (LOPRESSOR) 50 MG tablet TAKE 1 TABLET BY MOUTH EVERY DAY  . multivitamin-iron-minerals-folic acid (CENTRUM) chewable tablet Chew 1 tablet by mouth daily.    Marland Kitchen oxybutynin (DITROPAN-XL) 10 MG 24 hr tablet TAKE 1 TABLET BY MOUTH AT BEDTIME.  . pravastatin (PRAVACHOL) 20 MG tablet TAKE 1 TABLET BY MOUTH DAILY.  . SYNTHROID 100 MCG tablet TAKE 1/2 TABLET BY MOUTH DAILY BEFORE BREAKFAST EXCEPT ON MONDAYS AND FRIDAYS, THEN TAKE 1 TABLET BEFORE BREAKFAST.  Marland Kitchen Vitamin D, Ergocalciferol, (DRISDOL) 50000 units CAPS capsule TAKE 1 CAPSULE BY MOUTH EVERY 7 DAYS  . [DISCONTINUED] HYDROcodone-acetaminophen (NORCO/VICODIN) 5-325 MG tablet 1/2 to a whole tab  BID PRN   No facility-administered encounter medications on file as of 12/04/2015.      Review of Systems  Constitutional: Negative.   HENT: Negative.   Eyes: Negative.   Respiratory: Negative.   Cardiovascular: Negative.   Gastrointestinal: Negative.   Endocrine: Negative.   Genitourinary: Negative.   Musculoskeletal: Negative.   Skin: Negative.        Dry skin  Allergic/Immunologic: Negative.     Neurological: Negative.   Hematological: Negative.   Psychiatric/Behavioral: Negative.        Objective:   Physical Exam  Constitutional: She is oriented to person, place, and time. She appears well-developed and well-nourished. No distress.  HENT:  Head: Normocephalic and atraumatic.  Right Ear: External ear normal.  Left Ear: External ear normal.  Mouth/Throat: Oropharynx is clear and moist. No oropharyngeal exudate.  There is a lot of nasal turbinate congestion and redness bilaterally.  Eyes: Conjunctivae and EOM are normal. Pupils are equal, round, and reactive to light. Right eye exhibits no discharge. Left eye exhibits no discharge. No scleral icterus.  Neck: Normal range of motion. Neck supple. No thyromegaly present.  No bruits or thyromegaly  Cardiovascular: Normal rate, regular rhythm and normal heart sounds.   No murmur heard. Heart had a regular rate and rhythm at 72/m  Pulmonary/Chest: Effort normal and breath sounds normal. No respiratory distress. She has no wheezes. She has no rales.  Clear anteriorly and posteriorly  Abdominal: Soft. Bowel sounds are normal. She exhibits no mass. There is no tenderness. There is no rebound and no guarding.  There is some generalized abdominal tenderness and what is felt to be an incisional hernia is still there.  Musculoskeletal: Normal range of motion. She exhibits no edema.  Range of motion is limited with her back and legs secondary to this accident that occurred in 2004.  Lymphadenopathy:    She has no cervical adenopathy.  Neurological: She is alert and oriented to person, place, and time. She has normal reflexes. No cranial nerve deficit.  Skin: Skin is warm and dry. No rash noted.  Dry skin in general  Psychiatric: She has a normal mood and affect. Her behavior is normal. Judgment and thought content normal.  Nursing note and vitals reviewed.   BP (!) 93/59 (BP Location: Right Arm)   Pulse (!) 55   Temp 97.4 F (36.3  C) (Oral)   Ht 5' 1.5" (1.562 m)   Wt 195 lb (88.5 kg)   BMI 36.25 kg/m        Assessment & Plan:  1. Hyperlipidemia -Continue with pravastatin pending results of lab work - CBC with Differential/Platelet; Future - NMR, lipoprofile; Future  2. Hypertension -The blood pressure is good today and she will continue with current treatment - BMP8+EGFR; Future - CBC with Differential/Platelet; Future - Hepatic function panel; Future  3. Hypothyroidism, unspecified hypothyroidism type -Tinny with current treatment pending results of lab work - CBC with Differential/Platelet; Future - Thyroid Panel With TSH; Future  4. Vitamin D deficiency -Continue with current treatment pending results of lab work - CBC with Differential/Platelet; Future - VITAMIN D 25 Hydroxy (Vit-D Deficiency, Fractures); Future  5. Metabolic syndrome -The patient continues to lose weight slowly and she will continue with as aggressive therapeutic lifestyle changes as possible - CBC with Differential/Platelet; Future  6. Barrett's esophagus with dysplasia -Because she has had recurrent problems with reflux worse since on the Zantac she will go back on her Nexium 40 mg twice daily -  CBC with Differential/Platelet; Future  7. Left thigh pain -The patient will make an appointment to see the orthopedic surgeons at [facility] because of problems with her left 5 pain and instability with her left knee.  8. Recurrent left knee instability -Orthopedic referral and patient will make appointment  Meds ordered this encounter  Medications  . HYDROcodone-acetaminophen (NORCO/VICODIN) 5-325 MG tablet    Sig: 1/2 to a whole tab BID PRN    Dispense:  30 tablet    Refill:  0  . esomeprazole (NEXIUM) 40 MG capsule    Sig: Take 1 capsule (40 mg total) by mouth 2 (two) times daily before a meal.    Dispense:  60 capsule    Refill:  3   Patient Instructions                        Medicare Annual Wellness Visit  [facility] and the medical providers at [facility] strive to bring you the best medical care.  In doing so we not only want to address your current medical conditions and concerns but also to detect new conditions early and prevent illness, disease and health-related problems.    Medicare offers a yearly Wellness Visit which allows our clinical staff to assess your need for preventative services including immunizations, lifestyle education, counseling to decrease risk of preventable diseases and screening for fall risk and other medical concerns.    This visit is provided free of charge (no copay) for all Medicare recipients. The clinical pharmacists at [facility] have begun to conduct these Wellness Visits which will also include a thorough review of all your medications.    As you primary medical provider recommend that you make an appointment for your Annual Wellness Visit if you have not done so already this year.  You may set up this appointment before you leave today or you may call back [phone]) and schedule an appointment.  Please make sure when you call that you mention that you are scheduling your Annual Wellness Visit with the clinical pharmacist so that the appointment may be made for the proper length of time.     Continue current medications. Continue good therapeutic lifestyle changes which include good diet and exercise. Fall precautions discussed with patient. If an FOBT was given today- please return it to our front desk. If you are over 61 years old - you may need Prevnar 77 or the adult Pneumonia vaccine.   After your visit with [place] today you will receive a survey in the mail or online from [employer] regarding your care with [place]. Please take a moment to fill this out. Your feedback is very important to [place] as you can help [place] better understand your patient needs as well as improve your experience  and satisfaction. WE CARE ABOUT YOU!!!   It is important that you arrange for a follow-up appointment with the orthopedic surgeons that her for me with you at [facility]. If you have problems making this appointment please call us back Please return to the office fasting for lab work If abdominal pain recurs call us and we will get an appointment with the surgeon that operated on you originally Continue to work on weight loss through diet and exercise as much as possible  Arrie Senate MD

## 2015-12-05 ENCOUNTER — Telehealth: Payer: Self-pay

## 2015-12-05 NOTE — Telephone Encounter (Signed)
Try Dexilant 60 mg 1 daily before eating

## 2015-12-06 NOTE — Telephone Encounter (Signed)
Pt aware - she would rather try OTC

## 2015-12-25 ENCOUNTER — Other Ambulatory Visit: Payer: Medicare Other

## 2015-12-25 DIAGNOSIS — Z139 Encounter for screening, unspecified: Secondary | ICD-10-CM | POA: Diagnosis not present

## 2015-12-25 DIAGNOSIS — I10 Essential (primary) hypertension: Secondary | ICD-10-CM | POA: Diagnosis not present

## 2015-12-25 DIAGNOSIS — E785 Hyperlipidemia, unspecified: Secondary | ICD-10-CM | POA: Diagnosis not present

## 2015-12-25 DIAGNOSIS — E8881 Metabolic syndrome: Secondary | ICD-10-CM

## 2015-12-25 DIAGNOSIS — E559 Vitamin D deficiency, unspecified: Secondary | ICD-10-CM | POA: Diagnosis not present

## 2015-12-25 DIAGNOSIS — K22719 Barrett's esophagus with dysplasia, unspecified: Secondary | ICD-10-CM

## 2015-12-25 DIAGNOSIS — E039 Hypothyroidism, unspecified: Secondary | ICD-10-CM | POA: Diagnosis not present

## 2015-12-26 LAB — CBC WITH DIFFERENTIAL/PLATELET
BASOS: 1 %
Basophils Absolute: 0 10*3/uL (ref 0.0–0.2)
EOS (ABSOLUTE): 0.3 10*3/uL (ref 0.0–0.4)
EOS: 7 %
HEMATOCRIT: 40.4 % (ref 34.0–46.6)
HEMOGLOBIN: 13.7 g/dL (ref 11.1–15.9)
IMMATURE GRANULOCYTES: 0 %
Immature Grans (Abs): 0 10*3/uL (ref 0.0–0.1)
LYMPHS ABS: 1.4 10*3/uL (ref 0.7–3.1)
Lymphs: 29 %
MCH: 28 pg (ref 26.6–33.0)
MCHC: 33.9 g/dL (ref 31.5–35.7)
MCV: 83 fL (ref 79–97)
MONOCYTES: 5 %
MONOS ABS: 0.2 10*3/uL (ref 0.1–0.9)
Neutrophils Absolute: 2.8 10*3/uL (ref 1.4–7.0)
Neutrophils: 58 %
Platelets: 183 10*3/uL (ref 150–379)
RBC: 4.89 x10E6/uL (ref 3.77–5.28)
RDW: 13.9 % (ref 12.3–15.4)
WBC: 4.8 10*3/uL (ref 3.4–10.8)

## 2015-12-26 LAB — BMP8+EGFR
BUN / CREAT RATIO: 17 (ref 12–28)
BUN: 18 mg/dL (ref 8–27)
CALCIUM: 9 mg/dL (ref 8.7–10.3)
CHLORIDE: 101 mmol/L (ref 96–106)
CO2: 25 mmol/L (ref 18–29)
Creatinine, Ser: 1.08 mg/dL — ABNORMAL HIGH (ref 0.57–1.00)
GFR calc Af Amer: 62 mL/min/{1.73_m2} (ref >59)
GFR calc non Af Amer: 54 mL/min/{1.73_m2} — ABNORMAL LOW (ref >59)
GLUCOSE: 102 mg/dL — AB (ref 65–99)
POTASSIUM: 3.7 mmol/L (ref 3.5–5.2)
Sodium: 142 mmol/L (ref 134–144)

## 2015-12-26 LAB — FECAL OCCULT BLOOD, IMMUNOCHEMICAL: Fecal Occult Bld: NEGATIVE

## 2015-12-26 LAB — HEPATIC FUNCTION PANEL
ALBUMIN: 4.1 g/dL (ref 3.6–4.8)
ALK PHOS: 94 IU/L (ref 39–117)
ALT: 18 IU/L (ref 0–32)
AST: 19 IU/L (ref 0–40)
BILIRUBIN TOTAL: 0.5 mg/dL (ref 0.0–1.2)
Bilirubin, Direct: 0.16 mg/dL (ref 0.00–0.40)
Total Protein: 6.2 g/dL (ref 6.0–8.5)

## 2015-12-26 LAB — NMR, LIPOPROFILE
Cholesterol: 181 mg/dL (ref 100–199)
HDL CHOLESTEROL BY NMR: 50 mg/dL (ref >39)
HDL PARTICLE NUMBER: 35 umol/L (ref >=30.5)
LDL Particle Number: 1493 nmol/L — ABNORMAL HIGH (ref <1000)
LDL Size: 20.9 nm (ref >20.5)
LDL-C: 113 mg/dL — ABNORMAL HIGH (ref 0–99)
LP-IR SCORE: 54 — AB (ref <=45)
SMALL LDL PARTICLE NUMBER: 586 nmol/L — AB (ref <=527)
Triglycerides by NMR: 92 mg/dL (ref 0–149)

## 2015-12-26 LAB — VITAMIN D 25 HYDROXY (VIT D DEFICIENCY, FRACTURES): VIT D 25 HYDROXY: 25.6 ng/mL — AB (ref 30.0–100.0)

## 2015-12-26 LAB — THYROID PANEL WITH TSH
Free Thyroxine Index: 1.8 (ref 1.2–4.9)
T3 Uptake Ratio: 26 % (ref 24–39)
T4, Total: 6.8 ug/dL (ref 4.5–12.0)
TSH: 2.35 u[IU]/mL (ref 0.450–4.500)

## 2016-01-05 ENCOUNTER — Other Ambulatory Visit: Payer: Self-pay | Admitting: Family Medicine

## 2016-01-09 ENCOUNTER — Other Ambulatory Visit: Payer: Self-pay | Admitting: Family Medicine

## 2016-02-05 ENCOUNTER — Other Ambulatory Visit: Payer: Self-pay | Admitting: Family Medicine

## 2016-02-28 ENCOUNTER — Encounter: Payer: Self-pay | Admitting: Gastroenterology

## 2016-03-05 ENCOUNTER — Other Ambulatory Visit: Payer: Self-pay | Admitting: Family Medicine

## 2016-03-18 DIAGNOSIS — H8101 Meniere's disease, right ear: Secondary | ICD-10-CM | POA: Diagnosis not present

## 2016-03-18 DIAGNOSIS — H8141 Vertigo of central origin, right ear: Secondary | ICD-10-CM | POA: Diagnosis not present

## 2016-03-18 DIAGNOSIS — H9041 Sensorineural hearing loss, unilateral, right ear, with unrestricted hearing on the contralateral side: Secondary | ICD-10-CM | POA: Diagnosis not present

## 2016-04-04 ENCOUNTER — Other Ambulatory Visit: Payer: Self-pay | Admitting: Family Medicine

## 2016-04-05 ENCOUNTER — Encounter: Payer: Self-pay | Admitting: Family Medicine

## 2016-04-05 ENCOUNTER — Ambulatory Visit (INDEPENDENT_AMBULATORY_CARE_PROVIDER_SITE_OTHER): Payer: Medicare Other | Admitting: Family Medicine

## 2016-04-05 VITALS — BP 121/61 | HR 83 | Temp 98.3°F | Ht 61.5 in | Wt 199.0 lb

## 2016-04-05 DIAGNOSIS — M2550 Pain in unspecified joint: Secondary | ICD-10-CM

## 2016-04-05 DIAGNOSIS — Z79891 Long term (current) use of opiate analgesic: Secondary | ICD-10-CM | POA: Diagnosis not present

## 2016-04-05 DIAGNOSIS — R52 Pain, unspecified: Secondary | ICD-10-CM | POA: Diagnosis not present

## 2016-04-05 DIAGNOSIS — E7800 Pure hypercholesterolemia, unspecified: Secondary | ICD-10-CM | POA: Diagnosis not present

## 2016-04-05 DIAGNOSIS — M25511 Pain in right shoulder: Secondary | ICD-10-CM

## 2016-04-05 DIAGNOSIS — E559 Vitamin D deficiency, unspecified: Secondary | ICD-10-CM

## 2016-04-05 DIAGNOSIS — K22719 Barrett's esophagus with dysplasia, unspecified: Secondary | ICD-10-CM | POA: Diagnosis not present

## 2016-04-05 DIAGNOSIS — E8881 Metabolic syndrome: Secondary | ICD-10-CM

## 2016-04-05 DIAGNOSIS — I10 Essential (primary) hypertension: Secondary | ICD-10-CM | POA: Diagnosis not present

## 2016-04-05 MED ORDER — LEVOTHYROXINE SODIUM 100 MCG PO TABS: 100.0000 ug | ORAL_TABLET | Freq: Every day | ORAL | 11 refills | Status: DC

## 2016-04-05 MED ORDER — HYDROCODONE-ACETAMINOPHEN 5-325 MG PO TABS: 1.0000 | ORAL_TABLET | Freq: Every day | ORAL | 0 refills | Status: DC | PRN

## 2016-04-05 NOTE — Progress Notes (Signed)
Subjective:    Patient ID: Selena Hunt, female    DOB: 06/19/1950, 65 y.o.   MRN: [id number]  HPI Pt here for follow up and management of chronic medical problems which includes hyperlipidemia and hypertension. She is taking medication regularly.The patient is doing well overall. She does complain of some dizziness and generalized arthralgias. She also has some right shoulder pain which she thinks is secondary to a fall. She is requesting a refill on her Synthroid. She will get lab work today. Her vital signs are stable and her weight is up about 4 pounds since last visit. Patient fell a couple of months ago on her right shoulder. She has a history of rotator cuff problems. We will have her see one of the orthopedic surgeons about this because she is unable to fully abduct the right shoulder. She denies any chest pain or shortness of breath. She denies any trouble with swallowing heartburn indigestion nausea vomiting diarrhea or blood in the stool. She is due for colonoscopy and has plans to set this up with Dr. Henrene Pastor soon. She is passing her water frequently but admits to not drinking enough water and fluids.     Patient Active Problem List   Diagnosis Date Noted  . AP (abdominal pain) 03/22/2015  . Ventral hernia without obstruction or gangrene 03/22/2015  . Vitamin D deficiency 09/13/2013  . Chest pain 09/04/2013  . Metabolic syndrome 95/28/4132  . Migraine headache, history of   . Lumbosacral spondylosis without myelopathy   . Diverticulitis of colon (without mention of hemorrhage)(562.11)   . Obesity   . Fatigue, chronic   . Female stress incontinence   . Hypertension   . Hematuria   . Hyperlipidemia   . Benign neoplasm of colon   . Stricture and stenosis of esophagus    Outpatient Encounter Prescriptions as of 04/05/2016  Medication Sig  . calcium-vitamin D (OSCAL WITH D) 500-200 MG-UNIT per tablet Take 1 tablet by mouth daily with breakfast.   . esomeprazole (NEXIUM) 40 MG  capsule Take 1 capsule (40 mg total) by mouth 2 (two) times daily before a meal.  . fluticasone (FLONASE) 50 MCG/ACT nasal spray Place 2 sprays into both nostrils daily. (Patient taking differently: Place 2 sprays into both nostrils as needed. )  . hydrochlorothiazide (MICROZIDE) 12.5 MG capsule TAKE 1 CAPSULE BY MOUTH DAILY  . HYDROcodone-acetaminophen (NORCO/VICODIN) 5-325 MG tablet 1/2 to a whole tab BID PRN  . levothyroxine (SYNTHROID) 100 MCG tablet Take 1 tablet (100 mcg total) by mouth daily before breakfast.  . metoprolol (LOPRESSOR) 50 MG tablet TAKE 1 TABLET BY MOUTH EVERY DAY  . multivitamin-iron-minerals-folic acid (CENTRUM) chewable tablet Chew 1 tablet by mouth daily.    Marland Kitchen oxybutynin (DITROPAN-XL) 10 MG 24 hr tablet TAKE 1 TABLET BY MOUTH AT BEDTIME.  . pravastatin (PRAVACHOL) 20 MG tablet TAKE 1 TABLET BY MOUTH DAILY.  Marland Kitchen Vitamin D, Ergocalciferol, (DRISDOL) 50000 units CAPS capsule TAKE 1 CAPSULE BY MOUTH EVERY 7 DAYS  . [DISCONTINUED] SYNTHROID 100 MCG tablet TAKE 1/2 TABLET BY MOUTH DAILY BEFORE BREAKFAST EXCEPT ON MONDAYS AND FRIDAYS, THEN TAKE 1 TABLET BEFORE BREAKFAST.   No facility-administered encounter medications on file as of 04/05/2016.      Review of Systems  Constitutional: Negative.   HENT: Negative.   Eyes: Negative.   Respiratory: Negative.   Cardiovascular: Negative.   Gastrointestinal: Negative.   Endocrine: Negative.   Genitourinary: Negative.   Musculoskeletal: Positive for arthralgias (right shoulder pain, occ hand  pain).  Skin: Negative.   Allergic/Immunologic: Negative.   Neurological: Positive for dizziness.  Hematological: Negative.   Psychiatric/Behavioral: Negative.        Objective:   Physical Exam  Constitutional: She is oriented to person, place, and time. She appears well-developed and well-nourished. No distress.  The patient is calm and pleasant and alert.  HENT:  Head: Normocephalic and atraumatic.  Right Ear: External ear  normal.  Left Ear: External ear normal.  Mouth/Throat: Oropharynx is clear and moist.  Nasal congestion and turbinate swelling bilaterally  Eyes: Conjunctivae and EOM are normal. Pupils are equal, round, and reactive to light. Right eye exhibits no discharge. Left eye exhibits no discharge. No scleral icterus.  Neck: Normal range of motion. Neck supple. No thyromegaly present.  No bruits or thyromegaly  Cardiovascular: Normal rate, regular rhythm and normal heart sounds.  Exam reveals no gallop and no friction rub.   No murmur heard. Distal pulses were difficult to palpate in the left lower extremity. The heart has a regular rate and rhythm at 84/m  Pulmonary/Chest: Effort normal and breath sounds normal. No respiratory distress. She has no wheezes. She has no rales.  Dry cough no wheezes.  Abdominal: Soft. Bowel sounds are normal. She exhibits no mass. There is no tenderness. There is no rebound and no guarding.  Abdominal scar tissue with ventral hernia currently not causing any problems.  Musculoskeletal: She exhibits tenderness and deformity. She exhibits no edema.  The lower extremity is tender to palpation on the left side because of previous fracture history. She has limited movement of the right shoulder and unable to abduct the shoulder.  Lymphadenopathy:    She has no cervical adenopathy.  Neurological: She is alert and oriented to person, place, and time. She has normal reflexes. No cranial nerve deficit.  Skin: Skin is warm and dry. No rash noted.  Psychiatric: She has a normal mood and affect. Her behavior is normal. Judgment and thought content normal.  Nursing note and vitals reviewed.  BP 121/61 (BP Location: Right Arm)   Pulse 83   Temp 98.3 F (36.8 C) (Oral)   Ht 5' 1.5" (1.562 m)   Wt 199 lb (90.3 kg)   BMI 36.99 kg/m        Assessment & Plan:  1. Pure hypercholesterolemia -Continue current treatment pending results of lab work - CBC with  Differential/Platelet - NMR, lipoprofile  2. Hypertension -The blood pressure is good today and she should continue with current treatment - BMP8+EGFR - CBC with Differential/Platelet - Hepatic function panel  3. Vitamin D deficiency -Continue current treatment pending results of lab work - CBC with Differential/Platelet - VITAMIN D 25 Hydroxy (Vit-D Deficiency, Fractures)  4. Metabolic syndrome -Continue with aggressive therapeutic lifestyle changes and every effort to lose weight. - CBC with Differential/Platelet  5. Barrett's esophagus with dysplasia -Follow-up with Dr. Henrene Pastor as planned - CBC with Differential/Platelet  6. Arthralgia, unspecified joint -Tylenol for pain - CBC with Differential/Platelet - Arthritis Panel  7. Right shoulder pain, unspecified chronicity -Appointment with [facility] for further evaluation of right shoulder  Meds ordered this encounter  Medications  . levothyroxine (SYNTHROID) 100 MCG tablet    Sig: Take 1 tablet (100 mcg total) by mouth daily before breakfast.    Dispense:  30 tablet    Refill:  11   Patient Instructions  Medicare Annual Wellness Visit  [facility] and the medical providers at [facility] strive to bring you the best medical care.  In doing so we not only want to address your current medical conditions and concerns but also to detect new conditions early and prevent illness, disease and health-related problems.    Medicare offers a yearly Wellness Visit which allows our clinical staff to assess your need for preventative services including immunizations, lifestyle education, counseling to decrease risk of preventable diseases and screening for fall risk and other medical concerns.    This visit is provided free of charge (no copay) for all Medicare recipients. The clinical pharmacists at [facility] have begun to conduct these Wellness Visits  which will also include a thorough review of all your medications.    As you primary medical provider recommend that you make an appointment for your Annual Wellness Visit if you have not done so already this year.  You may set up this appointment before you leave today or you may call back ([phone]) and schedule an appointment.  Please make sure when you call that you mention that you are scheduling your Annual Wellness Visit with the clinical pharmacist so that the appointment may be made for the proper length of time.     Continue current medications. Continue good therapeutic lifestyle changes which include good diet and exercise. Fall precautions discussed with patient. If an FOBT was given today- please return it to our front desk. If you are over 22 years old - you may need Prevnar 7 or the adult Pneumonia vaccine.  **Flu shots are available--- please call and schedule a FLU-CLINIC appointment**  After your visit with [place] today you will receive a survey in the mail or online from [employer] regarding your care with [place]. Please take a moment to fill this out. Your feedback is very important to [place] as you can help [place] better understand your patient needs as well as improve your experience and satisfaction. WE CARE ABOUT YOU!!!   Follow-up with neurosurgery for her back as needed Appointment with [facility] orthopedics for evaluation of right shoulder range of motion issues with pain. Continue to make all efforts at losing weight If problems recur with ventral hernia, schedule visit with surgeon   Arrie Senate MD

## 2016-04-05 NOTE — Addendum Note (Signed)
Addended by: Zannie Cove on: 04/05/2016 11:03 AM   Modules accepted: Orders

## 2016-04-05 NOTE — Patient Instructions (Addendum)
Medicare Annual Wellness Visit  [place] and the medical providers at [facility] strive to bring you the best medical care.  In doing so we not only want to address your current medical conditions and concerns but also to detect new conditions early and prevent illness, disease and health-related problems.    Medicare offers a yearly Wellness Visit which allows our clinical staff to assess your need for preventative services including immunizations, lifestyle education, counseling to decrease risk of preventable diseases and screening for fall risk and other medical concerns.    This visit is provided free of charge (no copay) for all Medicare recipients. The clinical pharmacists at [facility] have begun to conduct these Wellness Visits which will also include a thorough review of all your medications.    As you primary medical provider recommend that you make an appointment for your Annual Wellness Visit if you have not done so already this year.  You may set up this appointment before you leave today or you may call back [id number]) and schedule an appointment.  Please make sure when you call that you mention that you are scheduling your Annual Wellness Visit with the clinical pharmacist so that the appointment may be made for the proper length of time.     Continue current medications. Continue good therapeutic lifestyle changes which include good diet and exercise. Fall precautions discussed with patient. If an FOBT was given today- please return it to our front desk. If you are over 66 years old - you may need Prevnar 1 or the adult Pneumonia vaccine.  **Flu shots are available--- please call and schedule a FLU-CLINIC appointment**  After your visit with [place] today you will receive a survey in the mail or online from [employer] regarding your care with [place]. Please take a moment to fill this out. Your feedback is very  important to [place] as you can help [place] better understand your patient needs as well as improve your experience and satisfaction. WE CARE ABOUT YOU!!!   Follow-up with neurosurgery for her back as needed Appointment with [facility] orthopedics for evaluation of right shoulder range of motion issues with pain. Continue to make all efforts at losing weight If problems recur with ventral hernia, schedule visit with surgeon

## 2016-04-05 NOTE — Addendum Note (Signed)
Addended by: Zannie Cove on: 04/05/2016 09:57 AM   Modules accepted: Orders

## 2016-04-06 LAB — ARTHRITIS PANEL
BASOS: 0 %
Basophils Absolute: 0 10*3/uL (ref 0.0–0.2)
EOS (ABSOLUTE): 0.2 10*3/uL (ref 0.0–0.4)
Eos: 5 %
HEMOGLOBIN: 13.8 g/dL (ref 11.1–15.9)
Hematocrit: 42 % (ref 34.0–46.6)
IMMATURE GRANS (ABS): 0 10*3/uL (ref 0.0–0.1)
Immature Granulocytes: 0 %
LYMPHS: 21 %
Lymphocytes Absolute: 0.9 10*3/uL (ref 0.7–3.1)
MCH: 28 pg (ref 26.6–33.0)
MCHC: 32.9 g/dL (ref 31.5–35.7)
MCV: 85 fL (ref 79–97)
Monocytes Absolute: 0.4 10*3/uL (ref 0.1–0.9)
Monocytes: 10 %
NEUTROS ABS: 2.8 10*3/uL (ref 1.4–7.0)
Neutrophils: 64 %
Platelets: 187 10*3/uL (ref 150–379)
RBC: 4.93 x10E6/uL (ref 3.77–5.28)
RDW: 14.1 % (ref 12.3–15.4)
Sed Rate: 6 mm/hr (ref 0–40)
URIC ACID: 7.3 mg/dL — AB (ref 2.5–7.1)
WBC: 4.4 10*3/uL (ref 3.4–10.8)

## 2016-04-06 LAB — HEPATIC FUNCTION PANEL
ALT: 22 IU/L (ref 0–32)
AST: 20 IU/L (ref 0–40)
Albumin: 4.1 g/dL (ref 3.6–4.8)
Alkaline Phosphatase: 108 IU/L (ref 39–117)
BILIRUBIN TOTAL: 0.3 mg/dL (ref 0.0–1.2)
BILIRUBIN, DIRECT: 0.09 mg/dL (ref 0.00–0.40)
Total Protein: 6.2 g/dL (ref 6.0–8.5)

## 2016-04-06 LAB — NMR, LIPOPROFILE
CHOLESTEROL: 206 mg/dL — AB (ref 100–199)
HDL CHOLESTEROL BY NMR: 51 mg/dL (ref >39)
HDL Particle Number: 39.3 umol/L (ref >=30.5)
LDL PARTICLE NUMBER: 1849 nmol/L — AB (ref <1000)
LDL Size: 20.3 nm (ref >20.5)
LDL-C: 128 mg/dL — AB (ref 0–99)
LP-IR Score: 70 — ABNORMAL HIGH (ref <=45)
Small LDL Particle Number: 1087 nmol/L — ABNORMAL HIGH (ref <=527)
TRIGLYCERIDES BY NMR: 136 mg/dL (ref 0–149)

## 2016-04-06 LAB — VITAMIN D 25 HYDROXY (VIT D DEFICIENCY, FRACTURES): Vit D, 25-Hydroxy: 23.2 ng/mL — ABNORMAL LOW (ref 30.0–100.0)

## 2016-04-06 LAB — BMP8+EGFR
BUN / CREAT RATIO: 20 (ref 12–28)
BUN: 20 mg/dL (ref 8–27)
CHLORIDE: 102 mmol/L (ref 96–106)
CO2: 25 mmol/L (ref 18–29)
CREATININE: 0.98 mg/dL (ref 0.57–1.00)
Calcium: 9.5 mg/dL (ref 8.7–10.3)
GFR calc Af Amer: 69 mL/min/{1.73_m2} (ref >59)
GFR calc non Af Amer: 60 mL/min/{1.73_m2} (ref >59)
GLUCOSE: 93 mg/dL (ref 65–99)
POTASSIUM: 4.2 mmol/L (ref 3.5–5.2)
SODIUM: 144 mmol/L (ref 134–144)

## 2016-04-10 ENCOUNTER — Ambulatory Visit: Payer: Medicare Other

## 2016-04-10 ENCOUNTER — Telehealth: Payer: Self-pay | Admitting: *Deleted

## 2016-04-10 MED ORDER — BENZONATATE 100 MG PO CAPS: 100.0000 mg | ORAL_CAPSULE | Freq: Two times a day (BID) | ORAL | 0 refills | Status: DC | PRN

## 2016-04-10 MED ORDER — AZITHROMYCIN 250 MG PO TABS: ORAL_TABLET | ORAL | 0 refills | Status: DC

## 2016-04-10 NOTE — Telephone Encounter (Signed)
Pt was called about her labs and she states that she had caught a cold, cough is really bad - strangling and caused her to vomit. -- per [facility] _ Zpak and tessalon pearles.  -- to be phoned in.

## 2016-04-12 LAB — TOXASSURE SELECT 13 (MW), URINE

## 2016-04-23 DIAGNOSIS — M25511 Pain in right shoulder: Secondary | ICD-10-CM | POA: Diagnosis not present

## 2016-04-30 ENCOUNTER — Encounter: Payer: Self-pay | Admitting: Family Medicine

## 2016-04-30 ENCOUNTER — Ambulatory Visit (INDEPENDENT_AMBULATORY_CARE_PROVIDER_SITE_OTHER): Payer: Medicare Other | Admitting: Family Medicine

## 2016-04-30 VITALS — BP 120/74 | HR 63 | Temp 98.4°F | Ht 61.5 in | Wt 194.0 lb

## 2016-04-30 DIAGNOSIS — R300 Dysuria: Secondary | ICD-10-CM | POA: Diagnosis not present

## 2016-04-30 DIAGNOSIS — N3090 Cystitis, unspecified without hematuria: Secondary | ICD-10-CM | POA: Diagnosis not present

## 2016-04-30 MED ORDER — SULFAMETHOXAZOLE-TRIMETHOPRIM 800-160 MG PO TABS: 1.0000 | ORAL_TABLET | Freq: Two times a day (BID) | ORAL | 0 refills | Status: DC

## 2016-04-30 NOTE — Progress Notes (Signed)
Subjective:  Patient ID: Selena Hunt, female    DOB: 01/01/1951  Age: 66 y.o. MRN: [id number]  CC: Urinary Tract Infection (odor and frequent)   HPI Selena Hunt presents for strong odor with urination and frequency for several days. Denies fever . No flank pain. No nausea, vomiting.   History Selena Hunt has a past medical history of Barrett's esophagus; Benign neoplasm of colon; Blood transfusion without reported diagnosis; Chronic kidney disease; Colon polyps; Diverticulitis of colon (without mention of hemorrhage)(562.11); Diverticulosis of colon (without mention of hemorrhage); Essential hypertension, benign; Female stress incontinence; GERD (gastroesophageal reflux disease); Hematuria; Hernia of unspecified site of abdominal cavity without mention of obstruction or gangrene; Hiatal hernia; Lumbosacral spondylosis without myelopathy; Migraine headache; Obesity; Other and unspecified hyperlipidemia; Pneumonia; Pulmonary embolism ([place]); Seizures ([place]); Stricture and stenosis of esophagus; Thyroid disease; and Ventral hernia.   She has a past surgical history that includes Abdominal hysterectomy (1990); Lumbar fusion (1995); Colon surgery (1997); Tubal ligation (1972); Femur fracture surgery (Left, 2003); Hip fracture surgery (Left); Ulnar nerve repair (Left); Tendon transfer; Humerus fracture surgery (Left); Femoral varus osteotomy w/ adductor release and iliac crest bone graft, pelvic osteotomy; Partial colectomy; and Hernia repair.   Her family history includes Alzheimer's disease in her father and mother; Hypertension in her mother; Stroke in her mother.She reports that she has never smoked. She has never used smokeless tobacco. She reports that she drinks alcohol. She reports that she does not use drugs.  Current Outpatient Prescriptions on File Prior to Visit  Medication Sig Dispense Refill  . calcium-vitamin D (OSCAL WITH D) 500-200 MG-UNIT per tablet Take 1 tablet by mouth daily with  breakfast.     . esomeprazole (NEXIUM) 40 MG capsule Take 1 capsule (40 mg total) by mouth 2 (two) times daily before a meal. 60 capsule 3  . fluticasone (FLONASE) 50 MCG/ACT nasal spray Place 2 sprays into both nostrils daily. (Patient taking differently: Place 2 sprays into both nostrils as needed. ) 16 g 6  . hydrochlorothiazide (MICROZIDE) 12.5 MG capsule TAKE 1 CAPSULE BY MOUTH DAILY 90 capsule 0  . HYDROcodone-acetaminophen (NORCO/VICODIN) 5-325 MG tablet Take 1 tablet by mouth daily as needed for moderate pain. 30 tablet 0  . HYDROcodone-acetaminophen (NORCO/VICODIN) 5-325 MG tablet Take 1 tablet by mouth daily as needed for moderate pain. 30 tablet 0  . HYDROcodone-acetaminophen (NORCO/VICODIN) 5-325 MG tablet Take 1 tablet by mouth daily as needed for moderate pain. 30 tablet 0  . levothyroxine (SYNTHROID) 100 MCG tablet Take 1 tablet (100 mcg total) by mouth daily before breakfast. 30 tablet 11  . metoprolol (LOPRESSOR) 50 MG tablet TAKE 1 TABLET BY MOUTH EVERY DAY 30 tablet 4  . multivitamin-iron-minerals-folic acid (CENTRUM) chewable tablet Chew 1 tablet by mouth daily.      Marland Kitchen oxybutynin (DITROPAN-XL) 10 MG 24 hr tablet TAKE 1 TABLET BY MOUTH AT BEDTIME. 90 tablet 0  . pravastatin (PRAVACHOL) 20 MG tablet TAKE 1 TABLET BY MOUTH DAILY. 30 tablet 4  . Vitamin D, Ergocalciferol, (DRISDOL) 50000 units CAPS capsule TAKE 1 CAPSULE BY MOUTH EVERY 7 DAYS 12 capsule 0  . [DISCONTINUED] solifenacin (VESICARE) 10 MG tablet Take 5 mg by mouth daily.       No current facility-administered medications on file prior to visit.     ROS Review of Systems  Constitutional: Negative for chills, diaphoresis and fever.  HENT: Negative for congestion.   Eyes: Negative for visual disturbance.  Respiratory: Negative for cough and shortness of  breath.   Cardiovascular: Negative for chest pain and palpitations.  Gastrointestinal: Negative for constipation, diarrhea and nausea.  Genitourinary: Positive for  frequency and urgency. Negative for decreased urine volume, dysuria, flank pain, hematuria, menstrual problem and pelvic pain.  Musculoskeletal: Negative for arthralgias and joint swelling.  Skin: Negative for rash.  Neurological: Negative for dizziness and numbness.    Objective:  BP 120/74 (BP Location: Left Arm)   Pulse 63   Temp 98.4 F (36.9 C) (Oral)   Ht 5' 1.5" (1.562 m)   Wt 194 lb (88 kg)   BMI 36.06 kg/m   Physical Exam  Constitutional: She is oriented to person, place, and time. She appears well-developed and well-nourished.  HENT:  Head: Normocephalic and atraumatic.  Cardiovascular: Normal rate and regular rhythm.   No murmur heard. Pulmonary/Chest: Effort normal and breath sounds normal.  Abdominal: Soft. Bowel sounds are normal. She exhibits no mass. There is no tenderness. There is no rebound and no guarding.  Musculoskeletal: She exhibits no tenderness.  Neurological: She is alert and oriented to person, place, and time.  Skin: Skin is warm and dry.  Psychiatric: She has a normal mood and affect. Her behavior is normal.    Assessment & Plan:   Selena Hunt was seen today for urinary tract infection.  Diagnoses and all orders for this visit:  Dysuria -     Urine culture -     Urinalysis, Complete  Cystitis  Other orders -     sulfamethoxazole-trimethoprim (BACTRIM DS,SEPTRA DS) 800-160 MG tablet; Take 1 tablet by mouth 2 (two) times daily.   I have discontinued Selena Hunt benzonatate and azithromycin. I am also having her start on sulfamethoxazole-trimethoprim. Additionally, I am having her maintain her multivitamin-iron-minerals-folic acid, calcium-vitamin D, fluticasone, esomeprazole, metoprolol, pravastatin, hydrochlorothiazide, oxybutynin, Vitamin D (Ergocalciferol), levothyroxine, HYDROcodone-acetaminophen, HYDROcodone-acetaminophen, and HYDROcodone-acetaminophen.  Meds ordered this encounter  Medications  . sulfamethoxazole-trimethoprim (BACTRIM  DS,SEPTRA DS) 800-160 MG tablet    Sig: Take 1 tablet by mouth 2 (two) times daily.    Dispense:  14 tablet    Refill:  0     Follow-up: Return if symptoms worsen or fail to improve.  Claretta Fraise, M.D.

## 2016-05-01 LAB — URINALYSIS, COMPLETE
BILIRUBIN UA: NEGATIVE
Glucose, UA: NEGATIVE
KETONES UA: NEGATIVE
Nitrite, UA: NEGATIVE
Protein, UA: NEGATIVE
RBC UA: NEGATIVE
SPEC GRAV UA: 1.015 (ref 1.005–1.030)
UUROB: 0.2 mg/dL (ref 0.2–1.0)
pH, UA: 6 (ref 5.0–7.5)

## 2016-05-01 LAB — MICROSCOPIC EXAMINATION
Epithelial Cells (non renal): >10 /hpf — AB (ref 0–10)
RBC, UA: NONE SEEN /hpf (ref 0–?)
Renal Epithel, UA: NONE SEEN /hpf

## 2016-05-03 LAB — URINE CULTURE

## 2016-05-07 ENCOUNTER — Other Ambulatory Visit: Payer: Self-pay | Admitting: *Deleted

## 2016-05-07 MED ORDER — OXYBUTYNIN CHLORIDE ER 10 MG PO TB24: 10.0000 mg | ORAL_TABLET | Freq: Every day | ORAL | 3 refills | Status: DC

## 2016-05-20 DIAGNOSIS — Z1231 Encounter for screening mammogram for malignant neoplasm of breast: Secondary | ICD-10-CM | POA: Diagnosis not present

## 2016-06-03 ENCOUNTER — Other Ambulatory Visit: Payer: Self-pay | Admitting: Family Medicine

## 2016-06-20 ENCOUNTER — Other Ambulatory Visit: Payer: Self-pay | Admitting: *Deleted

## 2016-07-03 ENCOUNTER — Other Ambulatory Visit: Payer: Self-pay | Admitting: Family Medicine

## 2016-07-16 DIAGNOSIS — H9313 Tinnitus, bilateral: Secondary | ICD-10-CM | POA: Diagnosis not present

## 2016-07-16 DIAGNOSIS — H903 Sensorineural hearing loss, bilateral: Secondary | ICD-10-CM | POA: Diagnosis not present

## 2016-07-16 DIAGNOSIS — H8103 Meniere's disease, bilateral: Secondary | ICD-10-CM | POA: Diagnosis not present

## 2016-07-19 DIAGNOSIS — H903 Sensorineural hearing loss, bilateral: Secondary | ICD-10-CM | POA: Diagnosis not present

## 2016-07-19 DIAGNOSIS — H8103 Meniere's disease, bilateral: Secondary | ICD-10-CM | POA: Diagnosis not present

## 2016-07-19 DIAGNOSIS — H9313 Tinnitus, bilateral: Secondary | ICD-10-CM | POA: Diagnosis not present

## 2016-07-23 ENCOUNTER — Ambulatory Visit (INDEPENDENT_AMBULATORY_CARE_PROVIDER_SITE_OTHER): Payer: Medicare Other | Admitting: *Deleted

## 2016-07-23 VITALS — BP 96/58 | HR 57 | Temp 97.8°F | Ht 60.0 in | Wt 190.0 lb

## 2016-07-23 DIAGNOSIS — Z0000 Encounter for general adult medical examination without abnormal findings: Secondary | ICD-10-CM
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
## 2016-07-23 NOTE — Patient Instructions (Signed)
  Ms. Grenz , Thank you for taking time to come for your Medicare Wellness Visit. I appreciate your ongoing commitment to your health goals. Please review the following plan we discussed and let me know if I can assist you in the future.   These are the goals we discussed: Goals    . Weight (lb) < 140 lb (63.5 kg)          Wants off of meds - HTN , lipids        This is a list of the screening recommended for you and due dates:  Health Maintenance  Topic Date Due  .  Hepatitis C: One time screening is recommended by Center for Disease Control  (CDC) for  adults born from [birth year] through 1967.   12/04/2016*  . Flu Shot  11/06/2016  . Stool Blood Test  12/24/2016  . Mammogram  05/20/2018  . Tetanus Vaccine  01/06/2021  . DEXA scan (bone density measurement)  Completed  . Pneumonia vaccines  Completed  *Topic was postponed. The date shown is not the original due date.   Keep follow up appointment with Dr Laurance Flatten and other specialist Review the advanced directives and bring [place] a copy of these records. Schedule colon with Dr Henrene Pastor next month

## 2016-07-24 DIAGNOSIS — J320 Chronic maxillary sinusitis: Secondary | ICD-10-CM | POA: Diagnosis not present

## 2016-07-24 DIAGNOSIS — H903 Sensorineural hearing loss, bilateral: Secondary | ICD-10-CM | POA: Diagnosis not present

## 2016-07-24 DIAGNOSIS — J370 Chronic laryngitis: Secondary | ICD-10-CM | POA: Diagnosis not present

## 2016-07-24 DIAGNOSIS — B370 Candidal stomatitis: Secondary | ICD-10-CM | POA: Diagnosis not present

## 2016-07-24 DIAGNOSIS — H8103 Meniere's disease, bilateral: Secondary | ICD-10-CM | POA: Diagnosis not present

## 2016-07-24 DIAGNOSIS — J322 Chronic ethmoidal sinusitis: Secondary | ICD-10-CM | POA: Diagnosis not present

## 2016-07-24 DIAGNOSIS — H9313 Tinnitus, bilateral: Secondary | ICD-10-CM | POA: Diagnosis not present

## 2016-07-30 DIAGNOSIS — H8103 Meniere's disease, bilateral: Secondary | ICD-10-CM | POA: Diagnosis not present

## 2016-07-30 DIAGNOSIS — H9313 Tinnitus, bilateral: Secondary | ICD-10-CM | POA: Diagnosis not present

## 2016-07-30 DIAGNOSIS — H903 Sensorineural hearing loss, bilateral: Secondary | ICD-10-CM | POA: Diagnosis not present

## 2016-08-09 DIAGNOSIS — H8103 Meniere's disease, bilateral: Secondary | ICD-10-CM | POA: Diagnosis not present

## 2016-08-09 DIAGNOSIS — H9311 Tinnitus, right ear: Secondary | ICD-10-CM | POA: Diagnosis not present

## 2016-08-09 DIAGNOSIS — J301 Allergic rhinitis due to pollen: Secondary | ICD-10-CM | POA: Diagnosis not present

## 2016-08-09 DIAGNOSIS — H903 Sensorineural hearing loss, bilateral: Secondary | ICD-10-CM | POA: Diagnosis not present

## 2016-08-20 DIAGNOSIS — H8103 Meniere's disease, bilateral: Secondary | ICD-10-CM | POA: Diagnosis not present

## 2016-08-20 DIAGNOSIS — H9313 Tinnitus, bilateral: Secondary | ICD-10-CM | POA: Diagnosis not present

## 2016-08-20 DIAGNOSIS — B370 Candidal stomatitis: Secondary | ICD-10-CM | POA: Diagnosis not present

## 2016-08-20 DIAGNOSIS — H903 Sensorineural hearing loss, bilateral: Secondary | ICD-10-CM | POA: Diagnosis not present

## 2016-08-26 ENCOUNTER — Encounter: Payer: Self-pay | Admitting: Family Medicine

## 2016-08-26 ENCOUNTER — Ambulatory Visit (INDEPENDENT_AMBULATORY_CARE_PROVIDER_SITE_OTHER): Payer: Medicare Other | Admitting: Family Medicine

## 2016-08-26 VITALS — BP 95/60 | HR 55 | Temp 97.5°F | Ht 60.0 in | Wt 191.0 lb

## 2016-08-26 DIAGNOSIS — E7800 Pure hypercholesterolemia, unspecified: Secondary | ICD-10-CM | POA: Diagnosis not present

## 2016-08-26 DIAGNOSIS — R52 Pain, unspecified: Secondary | ICD-10-CM | POA: Diagnosis not present

## 2016-08-26 DIAGNOSIS — H900 Conductive hearing loss, bilateral: Secondary | ICD-10-CM

## 2016-08-26 DIAGNOSIS — I10 Essential (primary) hypertension: Secondary | ICD-10-CM | POA: Diagnosis not present

## 2016-08-26 DIAGNOSIS — H8103 Meniere's disease, bilateral: Secondary | ICD-10-CM

## 2016-08-26 DIAGNOSIS — K22719 Barrett's esophagus with dysplasia, unspecified: Secondary | ICD-10-CM | POA: Diagnosis not present

## 2016-08-26 DIAGNOSIS — E559 Vitamin D deficiency, unspecified: Secondary | ICD-10-CM | POA: Diagnosis not present

## 2016-08-26 DIAGNOSIS — E8881 Metabolic syndrome: Secondary | ICD-10-CM

## 2016-08-26 MED ORDER — HYDROCODONE-ACETAMINOPHEN 5-325 MG PO TABS: 1.0000 | ORAL_TABLET | Freq: Every day | ORAL | 0 refills | Status: DC | PRN
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
## 2016-08-26 NOTE — Patient Instructions (Addendum)
Medicare Annual Wellness Visit  [facility] and the medical providers at [facility] strive to bring you the best medical care.  In doing so we not only want to address your current medical conditions and concerns but also to detect new conditions early and prevent illness, disease and health-related problems.    Medicare offers a yearly Wellness Visit which allows our clinical staff to assess your need for preventative services including immunizations, lifestyle education, counseling to decrease risk of preventable diseases and screening for fall risk and other medical concerns.    This visit is provided free of charge (no copay) for all Medicare recipients. The clinical pharmacists at [facility] have begun to conduct these Wellness Visits which will also include a thorough review of all your medications.    As you primary medical provider recommend that you make an appointment for your Annual Wellness Visit if you have not done so already this year.  You may set up this appointment before you leave today or you may call back ([phone]) and schedule an appointment.  Please make sure when you call that you mention that you are scheduling your Annual Wellness Visit with the clinical pharmacist so that the appointment may be made for the proper length of time.     Continue current medications. Continue good therapeutic lifestyle changes which include good diet and exercise. Fall precautions discussed with patient. If an FOBT was given today- please return it to our front desk. If you are over 42 years old - you may need Prevnar 80 or the adult Pneumonia vaccine.  **Flu shots are available--- please call and schedule a FLU-CLINIC appointment**  After your visit with [place] today you will receive a survey in the mail or online from [employer] regarding your care with [place]. Please take a moment to fill this out. Your feedback is very  important to [place] as you can help [place] better understand your patient needs as well as improve your experience and satisfaction. WE CARE ABOUT YOU!!!  Reduce the metoprolol to 25 mg daily and call us with some blood pressure readings in a couple weeks to let us know that your blood pressure stays less than 140 and less than 90.  On your trip this summer drink plenty of fluids and stay well hydrated

## 2016-08-27 LAB — BMP8+EGFR
BUN/Creatinine Ratio: 21 (ref 12–28)
BUN: 21 mg/dL (ref 8–27)
CALCIUM: 9.7 mg/dL (ref 8.7–10.3)
CHLORIDE: 102 mmol/L (ref 96–106)
CO2: 26 mmol/L (ref 18–29)
Creatinine, Ser: 0.98 mg/dL (ref 0.57–1.00)
GFR calc Af Amer: 69 mL/min/{1.73_m2} (ref >59)
GFR calc non Af Amer: 60 mL/min/{1.73_m2} (ref >59)
GLUCOSE: 81 mg/dL (ref 65–99)
POTASSIUM: 3.9 mmol/L (ref 3.5–5.2)
Sodium: 143 mmol/L (ref 134–144)

## 2016-08-27 LAB — NMR, LIPOPROFILE
CHOLESTEROL: 186 mg/dL (ref 100–199)
HDL Cholesterol by NMR: 47 mg/dL (ref >39)
HDL PARTICLE NUMBER: 33.7 umol/L (ref >=30.5)
LDL PARTICLE NUMBER: 1718 nmol/L — AB (ref <1000)
LDL Size: 21 nm (ref >20.5)
LDL-C: 113 mg/dL — AB (ref 0–99)
LP-IR SCORE: 35 (ref <=45)
Small LDL Particle Number: 851 nmol/L — ABNORMAL HIGH (ref <=527)
Triglycerides by NMR: 129 mg/dL (ref 0–149)

## 2016-08-27 LAB — CBC WITH DIFFERENTIAL/PLATELET
BASOS ABS: 0 10*3/uL (ref 0.0–0.2)
Basos: 0 %
EOS (ABSOLUTE): 0.2 10*3/uL (ref 0.0–0.4)
Eos: 4 %
Hematocrit: 40.9 % (ref 34.0–46.6)
Hemoglobin: 13.7 g/dL (ref 11.1–15.9)
IMMATURE GRANS (ABS): 0 10*3/uL (ref 0.0–0.1)
IMMATURE GRANULOCYTES: 0 %
LYMPHS: 30 %
Lymphocytes Absolute: 1.5 10*3/uL (ref 0.7–3.1)
MCH: 28.3 pg (ref 26.6–33.0)
MCHC: 33.5 g/dL (ref 31.5–35.7)
MCV: 85 fL (ref 79–97)
Monocytes Absolute: 0.4 10*3/uL (ref 0.1–0.9)
Monocytes: 7 %
NEUTROS PCT: 59 %
Neutrophils Absolute: 3 10*3/uL (ref 1.4–7.0)
PLATELETS: 188 10*3/uL (ref 150–379)
RBC: 4.84 x10E6/uL (ref 3.77–5.28)
RDW: 13.3 % (ref 12.3–15.4)
WBC: 5 10*3/uL (ref 3.4–10.8)

## 2016-08-27 LAB — HEPATIC FUNCTION PANEL
ALBUMIN: 4 g/dL (ref 3.6–4.8)
ALT: 17 IU/L (ref 0–32)
AST: 18 IU/L (ref 0–40)
Alkaline Phosphatase: 79 IU/L (ref 39–117)
BILIRUBIN TOTAL: 0.4 mg/dL (ref 0.0–1.2)
BILIRUBIN, DIRECT: 0.1 mg/dL (ref 0.00–0.40)
TOTAL PROTEIN: 6.2 g/dL (ref 6.0–8.5)

## 2016-08-27 LAB — VITAMIN D 25 HYDROXY (VIT D DEFICIENCY, FRACTURES): VIT D 25 HYDROXY: 42.7 ng/mL (ref 30.0–100.0)

## 2016-09-10 DIAGNOSIS — H35462 Secondary vitreoretinal degeneration, left eye: Secondary | ICD-10-CM | POA: Diagnosis not present

## 2016-09-10 DIAGNOSIS — H4720 Unspecified optic atrophy: Secondary | ICD-10-CM | POA: Diagnosis not present

## 2016-09-10 DIAGNOSIS — H3540 Unspecified peripheral retinal degeneration: Secondary | ICD-10-CM | POA: Diagnosis not present

## 2016-09-12 DIAGNOSIS — H8102 Meniere's disease, left ear: Secondary | ICD-10-CM | POA: Diagnosis not present

## 2016-09-12 DIAGNOSIS — H903 Sensorineural hearing loss, bilateral: Secondary | ICD-10-CM | POA: Diagnosis not present

## 2016-09-17 ENCOUNTER — Ambulatory Visit (INDEPENDENT_AMBULATORY_CARE_PROVIDER_SITE_OTHER): Payer: Medicare Other | Admitting: Family

## 2016-09-17 ENCOUNTER — Encounter: Payer: Self-pay | Admitting: Family

## 2016-09-17 VITALS — BP 108/69 | HR 58 | Temp 98.1°F | Ht 60.0 in | Wt 189.4 lb

## 2016-09-17 DIAGNOSIS — R399 Unspecified symptoms and signs involving the genitourinary system: Secondary | ICD-10-CM

## 2016-09-17 LAB — URINALYSIS, COMPLETE
BILIRUBIN UA: NEGATIVE
GLUCOSE, UA: NEGATIVE
Ketones, UA: NEGATIVE
Leukocytes, UA: NEGATIVE
Nitrite, UA: NEGATIVE
PROTEIN UA: NEGATIVE
RBC UA: NEGATIVE
Specific Gravity, UA: 1.01 (ref 1.005–1.030)
UUROB: 0.2 mg/dL (ref 0.2–1.0)
pH, UA: 6 (ref 5.0–7.5)

## 2016-09-17 LAB — MICROSCOPIC EXAMINATION
Epithelial Cells (non renal): >10 /hpf — AB (ref 0–10)
RBC MICROSCOPIC, UA: NONE SEEN /HPF (ref 0–?)
Renal Epithel, UA: NONE SEEN /hpf

## 2016-09-17 MED ORDER — NITROFURANTOIN MONOHYD MACRO 100 MG PO CAPS: 100.0000 mg | ORAL_CAPSULE | Freq: Two times a day (BID) | ORAL | 0 refills | Status: DC

## 2016-09-17 NOTE — Progress Notes (Signed)
   Subjective:    Patient ID: Selena Hunt, female    DOB: [birth year], 66 y.o.   MRN: [id number]  Dysuria   This is a new problem. The current episode started yesterday. The problem occurs every urination. The problem has been waxing and waning. The quality of the pain is described as burning. The pain is at a severity of 5/10. The pain is mild. Associated symptoms include frequency, hesitancy and urgency. Pertinent negatives include no chills, hematuria, nausea or vomiting. She has tried increased fluids for the symptoms. The treatment provided mild relief.      Review of Systems  Constitutional: Negative for chills.  Gastrointestinal: Negative for nausea and vomiting.  Genitourinary: Positive for dysuria, frequency, hesitancy and urgency. Negative for hematuria.  All other systems reviewed and are negative.      Objective:   Physical Exam  Constitutional: She is oriented to person, place, and time. She appears well-developed and well-nourished. No distress.  HENT:  Head: Normocephalic and atraumatic.  Eyes: Pupils are equal, round, and reactive to light.  Neck: Normal range of motion. Neck supple. No thyromegaly present.  Cardiovascular: Normal rate, regular rhythm, normal heart sounds and intact distal pulses.   No murmur heard. Pulmonary/Chest: Effort normal and breath sounds normal. No respiratory distress. She has no wheezes.  Abdominal: Soft. Bowel sounds are normal. She exhibits no distension. There is no tenderness.  Musculoskeletal: Normal range of motion. She exhibits no edema or tenderness.  Neurological: She is alert and oriented to person, place, and time.  Skin: Skin is warm and dry.  Psychiatric: She has a normal mood and affect. Her behavior is normal. Judgment and thought content normal.  Vitals reviewed.    Temp 98.1 F (36.7 C) (Oral)   Ht 5' (1.524 m)   Wt 189 lb 6.4 oz (85.9 kg)   BMI 36.99 kg/m      Assessment & Plan:  1. UTI symptoms Force  fluids AZO over the counter X2 days RTO prn Culture pending - Urinalysis, Complete - Urine Culture - nitrofurantoin, macrocrystal-monohydrate, (MACROBID) 100 MG capsule; Take 1 capsule (100 mg total) by mouth 2 (two) times daily.  Dispense: 10 capsule; Refill: 0   Evelina Dun, FNP

## 2016-09-17 NOTE — Patient Instructions (Signed)

## 2016-09-21 LAB — URINE CULTURE

## 2016-09-23 ENCOUNTER — Other Ambulatory Visit: Payer: Self-pay | Admitting: Family

## 2016-09-23 MED ORDER — SULFAMETHOXAZOLE-TRIMETHOPRIM 800-160 MG PO TABS
1.0000 | ORAL_TABLET | Freq: Two times a day (BID) | ORAL | 0 refills | Status: DC
Start: 2016-09-23 — End: 2016-11-12

## 2016-11-12 ENCOUNTER — Encounter: Payer: Self-pay | Admitting: Family Medicine

## 2016-11-12 ENCOUNTER — Ambulatory Visit (INDEPENDENT_AMBULATORY_CARE_PROVIDER_SITE_OTHER): Payer: Medicare Other | Admitting: Family Medicine

## 2016-11-12 VITALS — BP 122/69 | HR 72 | Temp 97.3°F | Ht 60.0 in | Wt 188.0 lb

## 2016-11-12 DIAGNOSIS — N3000 Acute cystitis without hematuria: Secondary | ICD-10-CM | POA: Diagnosis not present

## 2016-11-12 DIAGNOSIS — R300 Dysuria: Secondary | ICD-10-CM | POA: Diagnosis not present

## 2016-11-12 LAB — URINALYSIS
Bilirubin, UA: NEGATIVE
Glucose, UA: NEGATIVE
Ketones, UA: NEGATIVE
LEUKOCYTES UA: NEGATIVE
NITRITE UA: POSITIVE — AB
PROTEIN UA: NEGATIVE
RBC UA: NEGATIVE
SPEC GRAV UA: 1.015 (ref 1.005–1.030)
Urobilinogen, Ur: 1 mg/dL (ref 0.2–1.0)
pH, UA: 7 (ref 5.0–7.5)

## 2016-11-12 MED ORDER — CIPROFLOXACIN HCL 250 MG PO TABS: 250.0000 mg | ORAL_TABLET | Freq: Two times a day (BID) | ORAL | 0 refills | Status: DC

## 2016-11-12 NOTE — Patient Instructions (Signed)
Great to see you!   Urinary Tract Infection, Adult A urinary tract infection (UTI) is an infection of any part of the urinary tract, which includes the kidneys, ureters, bladder, and urethra. These organs make, store, and get rid of urine in the body. UTI can be a bladder infection (cystitis) or kidney infection (pyelonephritis). What are the causes? This infection may be caused by fungi, viruses, or bacteria. Bacteria are the most common cause of UTIs. This condition can also be caused by repeated incomplete emptying of the bladder during urination. What increases the risk? This condition is more likely to develop if:  You ignore your need to urinate or hold urine for long periods of time.  You do not empty your bladder completely during urination.  You wipe back to front after urinating or having a bowel movement, if you are female.  You are uncircumcised, if you are female.  You are constipated.  You have a urinary catheter that stays in place (indwelling).  You have a weak defense (immune) system.  You have a medical condition that affects your bowels, kidneys, or bladder.  You have diabetes.  You take antibiotic medicines frequently or for long periods of time, and the antibiotics no longer work well against certain types of infections (antibiotic resistance).  You take medicines that irritate your urinary tract.  You are exposed to chemicals that irritate your urinary tract.  You are female.  What are the signs or symptoms? Symptoms of this condition include:  Fever.  Frequent urination or passing small amounts of urine frequently.  Needing to urinate urgently.  Pain or burning with urination.  Urine that smells bad or unusual.  Cloudy urine.  Pain in the lower abdomen or back.  Trouble urinating.  Blood in the urine.  Vomiting or being less hungry than normal.  Diarrhea or abdominal pain.  Vaginal discharge, if you are female.  How is this  diagnosed? This condition is diagnosed with a medical history and physical exam. You will also need to provide a urine sample to test your urine. Other tests may be done, including:  Blood tests.  Sexually transmitted disease (STD) testing.  If you have had more than one UTI, a cystoscopy or imaging studies may be done to determine the cause of the infections. How is this treated? Treatment for this condition often includes a combination of two or more of the following:  Antibiotic medicine.  Other medicines to treat less common causes of UTI.  Over-the-counter medicines to treat pain.  Drinking enough water to stay hydrated.  Follow these instructions at home:  Take over-the-counter and prescription medicines only as told by your health care provider.  If you were prescribed an antibiotic, take it as told by your health care provider. Do not stop taking the antibiotic even if you start to feel better.  Avoid alcohol, caffeine, tea, and carbonated beverages. They can irritate your bladder.  Drink enough fluid to keep your urine clear or pale yellow.  Keep all follow-up visits as told by your health care provider. This is important.  Make sure to: ? Empty your bladder often and completely. Do not hold urine for long periods of time. ? Empty your bladder before and after sex. ? Wipe from front to back after a bowel movement if you are female. Use each tissue one time when you wipe. Contact a health care provider if:  You have back pain.  You have a fever.  You feel nauseous or   vomit.  Your symptoms do not get better after 3 days.  Your symptoms go away and then return. Get help right away if:  You have severe back pain or lower abdominal pain.  You are vomiting and cannot keep down any medicines or water. This information is not intended to replace advice given to you by your health care provider. Make sure you discuss any questions you have with your health care  provider. Document Released: 01/02/2005 Document Revised: 09/06/2015 Document Reviewed: 02/13/2015 Elsevier Interactive Patient Education  2017 [employer].

## 2016-11-12 NOTE — Progress Notes (Signed)
   HPI  Patient presents today here with concern for UTI.  Patient has 3-4 day symptoms of dysuria, low back pain, urgency, and slightly cloudy urine. She's had some frequent UTIs this year, she had one in January, one in June, and now another.  She denies fever, chills, sweats.  She previously did not have a great response to Bactrim. She's done well with Cipro recently.  Review of cultures shows resistance to Macrobid, cephalosporin 1st gen, ampicillin   PMH: Smoking status noted ROS: Per HPI  Objective: BP 122/69 (BP Location: Right Arm, Patient Position: Sitting, Cuff Size: Normal)   Pulse 72   Temp (!) 97.3 F (36.3 C) (Oral)   Ht 5' (1.524 m)   Wt 188 lb (85.3 kg)   BMI 36.72 kg/m  Gen: NAD, alert, cooperative with exam HEENT: NCAT, EOMI, PERRL CV: RRR, good S1/S2, no murmur Resp: CTABL, no wheezes, non-labored Abd: SNTND, BS present, no guarding or organomegaly, no CVA tenderness, no suprapubic tenderness Ext: No edema, warm Neuro: Alert and oriented, No gross deficits,  Assessment and plan:  # UTI Likely, UA minimally positive but this has happende to her previously Culture Cipro X 3 days, going out of town so 7 day course is set in case symptoms linger.  No signs of sepsis or pyelo Recurrent, consider referral to Dr. Erskin Burnet at [facility] with any concerns    Orders Placed This Encounter  Procedures  . Urine Culture  . Urinalysis    Meds ordered this encounter  Medications  . ciprofloxacin (CIPRO) 250 MG tablet    Sig: Take 1 tablet (250 mg total) by mouth 2 (two) times daily.    Dispense:  14 tablet    Refill:  0    Laroy Apple, MD [facility] 11/12/2016, 6:33 PM

## 2016-11-19 LAB — URINE CULTURE

## 2016-12-30 ENCOUNTER — Ambulatory Visit: Payer: Medicare Other | Admitting: Family Medicine

## 2016-12-31 ENCOUNTER — Ambulatory Visit (INDEPENDENT_AMBULATORY_CARE_PROVIDER_SITE_OTHER): Payer: Medicare Other

## 2016-12-31 ENCOUNTER — Ambulatory Visit (INDEPENDENT_AMBULATORY_CARE_PROVIDER_SITE_OTHER): Payer: Medicare Other | Admitting: Family Medicine

## 2016-12-31 ENCOUNTER — Encounter: Payer: Self-pay | Admitting: Family Medicine

## 2016-12-31 VITALS — BP 107/73 | HR 65 | Temp 97.7°F | Ht 60.0 in | Wt 186.0 lb

## 2016-12-31 DIAGNOSIS — Z780 Asymptomatic menopausal state: Secondary | ICD-10-CM

## 2016-12-31 DIAGNOSIS — Z1159 Encounter for screening for other viral diseases: Secondary | ICD-10-CM

## 2016-12-31 DIAGNOSIS — I10 Essential (primary) hypertension: Secondary | ICD-10-CM | POA: Diagnosis not present

## 2016-12-31 DIAGNOSIS — R1084 Generalized abdominal pain: Secondary | ICD-10-CM

## 2016-12-31 DIAGNOSIS — H900 Conductive hearing loss, bilateral: Secondary | ICD-10-CM | POA: Diagnosis not present

## 2016-12-31 DIAGNOSIS — E559 Vitamin D deficiency, unspecified: Secondary | ICD-10-CM | POA: Diagnosis not present

## 2016-12-31 DIAGNOSIS — E8881 Metabolic syndrome: Secondary | ICD-10-CM

## 2016-12-31 DIAGNOSIS — N390 Urinary tract infection, site not specified: Secondary | ICD-10-CM

## 2016-12-31 DIAGNOSIS — G8929 Other chronic pain: Secondary | ICD-10-CM

## 2016-12-31 DIAGNOSIS — Z13820 Encounter for screening for osteoporosis: Secondary | ICD-10-CM

## 2016-12-31 DIAGNOSIS — Z1211 Encounter for screening for malignant neoplasm of colon: Secondary | ICD-10-CM | POA: Diagnosis not present

## 2016-12-31 DIAGNOSIS — E7800 Pure hypercholesterolemia, unspecified: Secondary | ICD-10-CM

## 2016-12-31 DIAGNOSIS — M25562 Pain in left knee: Secondary | ICD-10-CM | POA: Diagnosis not present

## 2016-12-31 MED ORDER — HYDROCODONE-ACETAMINOPHEN 5-325 MG PO TABS: 1.0000 | ORAL_TABLET | Freq: Every day | ORAL | 0 refills | Status: DC | PRN
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
## 2016-12-31 NOTE — Patient Instructions (Addendum)
We will arrange for you to have an appointment with the gastroenterologist because of your abdominal pain in your need for a colonoscopy Continue to follow-up with ear nose and throat specialist for hearing aids We will also arrange for you to have an appointment with the urologist because of recurrent urinary tract infections Continue to be careful do not put yourself at risk for falling The patient will hold the pravastatin and restart this October 1 by just taking one at bedtime She will continue to stay on Nexium twice daily until she sees the gastroenterologist

## 2017-01-01 LAB — LIPID PANEL
CHOL/HDL RATIO: 3.1 ratio (ref 0.0–4.4)
Cholesterol, Total: 160 mg/dL (ref 100–199)
HDL: 52 mg/dL (ref >39)
LDL CALC: 87 mg/dL (ref 0–99)
Triglycerides: 103 mg/dL (ref 0–149)
VLDL Cholesterol Cal: 21 mg/dL (ref 5–40)

## 2017-01-01 LAB — CBC WITH DIFFERENTIAL/PLATELET
BASOS: 0 %
Basophils Absolute: 0 10*3/uL (ref 0.0–0.2)
EOS (ABSOLUTE): 0.3 10*3/uL (ref 0.0–0.4)
EOS: 5 %
HEMATOCRIT: 42.6 % (ref 34.0–46.6)
Hemoglobin: 14.6 g/dL (ref 11.1–15.9)
IMMATURE GRANULOCYTES: 0 %
Immature Grans (Abs): 0 10*3/uL (ref 0.0–0.1)
Lymphocytes Absolute: 1.7 10*3/uL (ref 0.7–3.1)
Lymphs: 27 %
MCH: 28.2 pg (ref 26.6–33.0)
MCHC: 34.3 g/dL (ref 31.5–35.7)
MCV: 82 fL (ref 79–97)
MONOS ABS: 0.4 10*3/uL (ref 0.1–0.9)
Monocytes: 7 %
NEUTROS ABS: 3.8 10*3/uL (ref 1.4–7.0)
NEUTROS PCT: 61 %
PLATELETS: 199 10*3/uL (ref 150–379)
RBC: 5.18 x10E6/uL (ref 3.77–5.28)
RDW: 14.4 % (ref 12.3–15.4)
WBC: 6.3 10*3/uL (ref 3.4–10.8)

## 2017-01-01 LAB — BMP8+EGFR
BUN / CREAT RATIO: 19 (ref 12–28)
BUN: 20 mg/dL (ref 8–27)
CALCIUM: 9.7 mg/dL (ref 8.7–10.3)
CO2: 26 mmol/L (ref 20–29)
Chloride: 99 mmol/L (ref 96–106)
Creatinine, Ser: 1.07 mg/dL — ABNORMAL HIGH (ref 0.57–1.00)
GFR, EST AFRICAN AMERICAN: 62 mL/min/{1.73_m2} (ref >59)
GFR, EST NON AFRICAN AMERICAN: 54 mL/min/{1.73_m2} — AB (ref >59)
Glucose: 83 mg/dL (ref 65–99)
POTASSIUM: 3.9 mmol/L (ref 3.5–5.2)
SODIUM: 143 mmol/L (ref 134–144)

## 2017-01-01 LAB — HEPATIC FUNCTION PANEL
ALBUMIN: 4.5 g/dL (ref 3.6–4.8)
ALT: 20 IU/L (ref 0–32)
AST: 25 IU/L (ref 0–40)
Alkaline Phosphatase: 103 IU/L (ref 39–117)
BILIRUBIN TOTAL: 0.5 mg/dL (ref 0.0–1.2)
Bilirubin, Direct: 0.14 mg/dL (ref 0.00–0.40)
Total Protein: 6.6 g/dL (ref 6.0–8.5)

## 2017-01-01 LAB — VITAMIN D 25 HYDROXY (VIT D DEFICIENCY, FRACTURES): VIT D 25 HYDROXY: 33.5 ng/mL (ref 30.0–100.0)

## 2017-01-01 LAB — HEPATITIS C ANTIBODY: Hep C Virus Ab: <0.1 s/co ratio (ref 0.0–0.9)

## 2017-01-21 ENCOUNTER — Ambulatory Visit (INDEPENDENT_AMBULATORY_CARE_PROVIDER_SITE_OTHER): Payer: Medicare Other | Admitting: Gastroenterology

## 2017-01-21 ENCOUNTER — Encounter: Payer: Self-pay | Admitting: Gastroenterology

## 2017-01-21 VITALS — BP 122/80 | HR 64 | Ht 61.0 in | Wt 191.6 lb

## 2017-01-21 DIAGNOSIS — R110 Nausea: Secondary | ICD-10-CM | POA: Diagnosis not present

## 2017-01-21 DIAGNOSIS — R52 Pain, unspecified: Secondary | ICD-10-CM | POA: Insufficient documentation

## 2017-01-21 DIAGNOSIS — R1013 Epigastric pain: Secondary | ICD-10-CM | POA: Insufficient documentation

## 2017-01-21 DIAGNOSIS — Z1211 Encounter for screening for malignant neoplasm of colon: Secondary | ICD-10-CM

## 2017-01-21 DIAGNOSIS — K219 Gastro-esophageal reflux disease without esophagitis: Secondary | ICD-10-CM

## 2017-01-21 MED ORDER — NA SULFATE-K SULFATE-MG SULF 17.5-3.13-1.6 GM/177ML PO SOLN: 1.0000 | Freq: Once | ORAL | 0 refills | Status: AC

## 2017-01-21 NOTE — Progress Notes (Signed)
01/21/2017 Selena Hunt [id number] [birth year]   HISTORY OF PRESENT ILLNESS:  This is a 66 year old female who is previously known to Dr. Velora Heckler. Her last colonoscopy was in January 2008 at which time she was found have only diverticulosis. She has history of colon polyp on a previous colonoscopy, but polyp was hyperplastic. Her next colonoscopy is due January 2018. She also had an EGD in January 2008 that showed only a hiatal hernia. She presents to our office today at the request of her PCP, Dr. Redge Gainer, for evaluation regarding abdominal pain. She has history of multiple abdominal surgeries including partial colon resection for diverticular disease in 1997 and ventral hernia repair with mesh in the past as well.  She was seen here in 03/2015 with similar complaints of abdominal pain that was thought to be from recurrent ventral hernia/mesh/adhesions.  She continues to complain of upper mid-abdominal pain.  Pain is present almost all the time.  Also has daily nausea.  Has experienced a couple random episodes of vomiting.  CT scan of the abdomen and pelvis with and without contrast in 03/2015 showed ventral hernia containing omental fat, but was otherwise unremarkable for any causes of pain. Recent CBC, BMP, and hepatic function panel within normal limits. She does take Nexium BID for upper GI issues, but that has continued to work well for her and she is otherwise asymptomatic. She denies any NSAID use.  She does have a remote history of Barrett's esophagus, but as stated above, last EGD in 2008 only showed a hiatal hernia.   Past Medical History:  Diagnosis Date  . Barrett's esophagus    no longer a issue -= 2018  . Benign neoplasm of colon   . Blood transfusion without reported diagnosis    x 34 to date  . Chronic kidney disease   . Colon polyps   . Diverticulitis of colon (without mention of hemorrhage)(562.11)   . Diverticulosis of colon (without mention of hemorrhage)   .  Essential hypertension, benign   . Female stress incontinence   . GERD (gastroesophageal reflux disease)   . Hematuria   . Hernia of unspecified site of abdominal cavity without mention of obstruction or gangrene   . Kidney stones   . Lumbosacral spondylosis without myelopathy   . Meniere disease   . Migraine headache   . Neuromuscular disorder ([place])   . Obesity   . Other and unspecified hyperlipidemia   . Pneumonia   . Pulmonary embolism ([place])   . Seizures ([facility])    HX of --- from medications  . Stricture and stenosis of esophagus   . Thyroid disease   . Ventral hernia    Past Surgical History:  Procedure Laterality Date  . ABDOMINAL HYSTERECTOMY  1990  . APPENDECTOMY    . [place]   Removed  . FEMORAL VARUS OSTEOTOMY W/ ADDUCTOR RELEASE AND ILIAC CREST BONE GRAFT, PELVIC OSTEOTOMY    . FEMUR FRACTURE SURGERY Left 2003  . HERNIA REPAIR     with mesh, abdominal  . HIP FRACTURE SURGERY Left   . HUMERUS FRACTURE SURGERY Left   . LUMBAR FUSION  1995   Fusion L4 - L5  . PARTIAL COLECTOMY     Secondary to diverticulitis  . TENDON TRANSFER    . TUBAL LIGATION  1972  . ULNAR NERVE REPAIR Left     reports that she has never smoked. She has never used smokeless tobacco. She reports that she  drinks alcohol. She reports that she does not use drugs. family history includes Alzheimer's disease in her father and mother; Depression in her son; Fibromyalgia in her daughter; GER disease in her son; Gout in her brother; Hypertension in her mother and son; Spondylitis in her son; Stroke in her mother. Allergies  Allergen Reactions  . Ambien [Zolpidem Tartrate] Anxiety  . Diovan [Valsartan] Other (See Comments)    Seizures.  . Wellbutrin [Bupropion] Other (See Comments)    Seizures.  . Codeine   . Feldene [Piroxicam]   . Guaifenesin Er   . Latex   . Mobic [Meloxicam] Other (See Comments)    Stomach cramps  . Neurontin [Gabapentin]   . Crestor [Rosuvastatin Calcium] Other  (See Comments)    Myalgias   . Lipitor [Atorvastatin Calcium] Other (See Comments)    myalgias  . Livalo [Pitavastatin] Swelling and Other (See Comments)    stomach cramping   . Simcor [Niacin-Simvastatin Er] Other (See Comments)    Stomach cramps  . Zocor [Simvastatin] Other (See Comments)    Stomach cramps      Outpatient Encounter Prescriptions as of 01/21/2017  Medication Sig  . calcium-vitamin D (OSCAL WITH D) 500-200 MG-UNIT per tablet Take 1 tablet by mouth daily with breakfast.   . esomeprazole (NEXIUM) 40 MG capsule Take 1 capsule (40 mg total) by mouth 2 (two) times daily before a meal.  . fluticasone (FLONASE) 50 MCG/ACT nasal spray Place 2 sprays into both nostrils daily. (Patient taking differently: Place 2 sprays into both nostrils as needed. )  . HYDROcodone-acetaminophen (NORCO/VICODIN) 5-325 MG tablet Take 1 tablet by mouth daily as needed for moderate pain.  Marland Kitchen levothyroxine (SYNTHROID) 100 MCG tablet Take 1 tablet (100 mcg total) by mouth daily before breakfast.  . meclizine (ANTIVERT) 25 MG tablet Take 25 mg by mouth every 8 (eight) hours as needed.  . metoprolol (LOPRESSOR) 50 MG tablet TAKE 1 TABLET BY MOUTH EVERY DAY  . multivitamin-iron-minerals-folic acid (CENTRUM) chewable tablet Chew 1 tablet by mouth daily.    Marland Kitchen oxybutynin (DITROPAN-XL) 10 MG 24 hr tablet Take 1 tablet (10 mg total) by mouth at bedtime.  . pravastatin (PRAVACHOL) 20 MG tablet TAKE 1 TABLET BY MOUTH DAILY.  Marland Kitchen triamterene-hydrochlorothiazide (DYAZIDE) 37.5-25 MG capsule Take by mouth daily.  . Vitamin D, Ergocalciferol, (DRISDOL) 50000 units CAPS capsule TAKE 1 CAPSULE BY MOUTH EVERY 7 DAYS  . [DISCONTINUED] HYDROcodone-acetaminophen (NORCO/VICODIN) 5-325 MG tablet Take 1 tablet by mouth daily as needed for moderate pain.  . [DISCONTINUED] HYDROcodone-acetaminophen (NORCO/VICODIN) 5-325 MG tablet Take 1 tablet by mouth daily as needed for moderate pain.   No facility-administered encounter  medications on file as of 01/21/2017.      REVIEW OF SYSTEMS  : All other systems reviewed and negative except where noted in the History of Present Illness.   PHYSICAL EXAM: BP 122/80   Pulse 64   Ht 5\' 1"  (1.549 m)   Wt 191 lb 9.6 oz (86.9 kg)   BMI 36.20 kg/m  General: Well developed white female in no acute distress Head: Normocephalic and atraumatic Eyes:  Sclerae anicteric, conjunctiva pink. Ears: Normal auditory acuity Lungs: Clear throughout to auscultation; no increased WOB. Heart: Regular rate and rhythm; no M/R/G. Abdomen: Soft, non-distended.  BS present.  Multiple scars noted on abdomen including a vertical scar on her upper abdomen.  Mid-epigastric/mid-abdominal TTP. Rectal:  Will be done at the time of colonoscopy. Musculoskeletal: Symmetrical with no gross deformities  Skin: No lesions on  visible extremities Extremities: No edema  Neurological: Alert oriented x 4, grossly non-focal Psychological:  Alert and cooperative. Normal mood and affect  ASSESSMENT AND PLAN: *66 year old female with multiple abdominal surgeries including previous ventral hernia repair with mesh. With chronic midabdominal pain that I think is likely related to recurrent hernia/mesh and/or adhesions.  Also complains of frequent nausea.  She does have a long-standing history of GERD, which seems to be well controlled on daily PPI.  Also has a remote history of Barrett's esophagus.  Last EGD in 2008 was normal.  That being said, I do no think that it is unreasonable to schedule EGD at this time. *Screening for CRC:  Will schedule for colonoscopy with Dr. Fuller Plan.    *The risks, benefits, and alternatives to EGD and colonoscopy were discussed with the patient and she consents to proceed.    CC:  Chipper Herb, MD

## 2017-01-21 NOTE — Patient Instructions (Addendum)
If you are age 68 or older, your body mass index should be between 23-30. Your Body mass index is 36.2 kg/m. If this is out of the aforementioned range listed, please consider follow up with your Primary Care Provider.  If you are age 60 or younger, your body mass index should be between 19-25. Your Body mass index is 36.2 kg/m. If this is out of the aformentioned range listed, please consider follow up with your Primary Care Provider.   We have sent the following medications to your pharmacy for you to pick up at your convenience:  [place] have been scheduled for an endoscopy and colonoscopy. Please follow the written instructions given to you at your visit today. Please pick up your prep supplies at the pharmacy within the next 1-3 days. If you use inhalers (even only as needed), please bring them with you on the day of your procedure. Your physician has requested that you go to www.startemmi.com and enter the access code given to you at your visit today. This web site gives a general overview about your procedure. However, you should still follow specific instructions given to you by our office regarding your preparation for the procedure.  Thank you.

## 2017-01-21 NOTE — Progress Notes (Signed)
Reviewed and agree with initial management plan.  Sakoya Win T. Niesha Bame, MD FACG 

## 2017-02-03 ENCOUNTER — Telehealth: Payer: Self-pay | Admitting: Family Medicine

## 2017-02-03 MED ORDER — TRIAMTERENE-HCTZ 37.5-25 MG PO CAPS: 1.0000 | ORAL_CAPSULE | Freq: Every day | ORAL | 1 refills | Status: DC

## 2017-02-03 NOTE — Telephone Encounter (Signed)
Spoke with pt - she is taking triam- hctz now from another DR _ it works better than the old HCTZ she stopped - wanted refill - sent in

## 2017-02-04 ENCOUNTER — Encounter: Payer: Self-pay | Admitting: Gastroenterology

## 2017-02-04 ENCOUNTER — Ambulatory Visit (AMBULATORY_SURGERY_CENTER): Payer: Medicare Other | Admitting: Gastroenterology

## 2017-02-04 VITALS — BP 135/66 | HR 56 | Temp 98.6°F | Resp 11 | Ht 61.0 in | Wt 191.0 lb

## 2017-02-04 DIAGNOSIS — R110 Nausea: Secondary | ICD-10-CM | POA: Diagnosis not present

## 2017-02-04 DIAGNOSIS — Z1212 Encounter for screening for malignant neoplasm of rectum: Secondary | ICD-10-CM

## 2017-02-04 DIAGNOSIS — I10 Essential (primary) hypertension: Secondary | ICD-10-CM | POA: Diagnosis not present

## 2017-02-04 DIAGNOSIS — D122 Benign neoplasm of ascending colon: Secondary | ICD-10-CM | POA: Diagnosis not present

## 2017-02-04 DIAGNOSIS — K5732 Diverticulitis of large intestine without perforation or abscess without bleeding: Secondary | ICD-10-CM

## 2017-02-04 DIAGNOSIS — D124 Benign neoplasm of descending colon: Secondary | ICD-10-CM | POA: Diagnosis not present

## 2017-02-04 DIAGNOSIS — Z1211 Encounter for screening for malignant neoplasm of colon: Secondary | ICD-10-CM | POA: Diagnosis not present

## 2017-02-04 DIAGNOSIS — K449 Diaphragmatic hernia without obstruction or gangrene: Secondary | ICD-10-CM | POA: Diagnosis not present

## 2017-02-04 DIAGNOSIS — R1013 Epigastric pain: Secondary | ICD-10-CM

## 2017-02-04 DIAGNOSIS — K222 Esophageal obstruction: Secondary | ICD-10-CM | POA: Diagnosis not present

## 2017-02-04 DIAGNOSIS — K529 Noninfective gastroenteritis and colitis, unspecified: Secondary | ICD-10-CM | POA: Diagnosis not present

## 2017-02-04 DIAGNOSIS — K219 Gastro-esophageal reflux disease without esophagitis: Secondary | ICD-10-CM | POA: Diagnosis not present

## 2017-02-04 DIAGNOSIS — K317 Polyp of stomach and duodenum: Secondary | ICD-10-CM | POA: Diagnosis not present

## 2017-02-04 MED ORDER — CIPROFLOXACIN HCL 500 MG PO TABS: 500.0000 mg | ORAL_TABLET | Freq: Two times a day (BID) | ORAL | 0 refills | Status: AC

## 2017-02-04 MED ORDER — SODIUM CHLORIDE 0.9 % IV SOLN: 500.0000 mL | INTRAVENOUS | Status: DC

## 2017-02-04 MED ORDER — METRONIDAZOLE 500 MG PO TABS: 500.0000 mg | ORAL_TABLET | Freq: Two times a day (BID) | ORAL | 0 refills | Status: AC

## 2017-02-04 NOTE — Progress Notes (Signed)
Pt's states no medical or surgical changes since previsit or office visit. 

## 2017-02-04 NOTE — Progress Notes (Signed)
Called to room to assist during endoscopic procedure.  Patient ID and intended procedure confirmed with present staff. Received instructions for my participation in the procedure from the performing physician.  

## 2017-02-04 NOTE — Patient Instructions (Signed)
NEW PRESCRIPTIONS SENT TO YOUR [facility].    YOU HAD AN ENDOSCOPIC PROCEDURE TODAY AT [facility]:   Refer to the procedure report that was given to you for any specific questions about what was found during the examination.  If the procedure report does not answer your questions, please call your gastroenterologist to clarify.  If you requested that your care partner not be given the details of your procedure findings, then the procedure report has been included in a sealed envelope for you to review at your convenience later.  YOU SHOULD EXPECT: Some feelings of bloating in the abdomen. Passage of more gas than usual.  Walking can help get rid of the air that was put into your GI tract during the procedure and reduce the bloating. If you had a lower endoscopy (such as a colonoscopy or flexible sigmoidoscopy) you may notice spotting of blood in your stool or on the toilet paper. If you underwent a bowel prep for your procedure, you may not have a normal bowel movement for a few days.  Please Note:  You might notice some irritation and congestion in your nose or some drainage.  This is from the oxygen used during your procedure.  There is no need for concern and it should clear up in a day or so.  SYMPTOMS TO REPORT IMMEDIATELY:   Following lower endoscopy (colonoscopy or flexible sigmoidoscopy):  Excessive amounts of blood in the stool  Significant tenderness or worsening of abdominal pains  Swelling of the abdomen that is new, acute  Fever of 100F or higher   Following upper endoscopy (EGD)  Vomiting of blood or coffee ground material  New chest pain or pain under the shoulder blades  Painful or persistently difficult swallowing  New shortness of breath  Fever of 100F or higher  Black, tarry-looking stools  For urgent or emergent issues, a gastroenterologist can be reached at any hour by calling [phone].   DIET:  We do recommend a small meal at first,  but then you may proceed to your regular diet.  Drink plenty of fluids but you should avoid alcoholic beverages for 24 hours.  ACTIVITY:  You should plan to take it easy for the rest of today and you should NOT DRIVE or use heavy machinery until tomorrow (because of the sedation medicines used during the test).    FOLLOW UP: Our staff will call the number listed on your records the next business day following your procedure to check on you and address any questions or concerns that you may have regarding the information given to you following your procedure. If we do not reach you, we will leave a message.  However, if you are feeling well and you are not experiencing any problems, there is no need to return our call.  We will assume that you have returned to your regular daily activities without incident.  If any biopsies were taken you will be contacted by phone or by letter within the next 1-3 weeks.  Please call us at [phone] if you have not heard about the biopsies in 3 weeks.    SIGNATURES/CONFIDENTIALITY: You and/or your care partner have signed paperwork which will be entered into your electronic medical record.  These signatures attest to the fact that that the information above on your After Visit Summary has been reviewed and is understood.  Full responsibility of the confidentiality of this discharge information lies with you and/or your care-partner.

## 2017-02-04 NOTE — Op Note (Signed)
[place] Patient Name: Selena Hunt Procedure Date: 02/04/2017 11:33 AM MRN: [id number] Endoscopist: Ladene Artist , MD Age: 66 Referring MD:  Date of Birth: 12-01-1950 Gender: Female Account #: [id number] Procedure:                Upper GI endoscopy Indications:              Epigastric abdominal pain, GERD, Barrett's on                            biopsies from 2002 Medicines:                Monitored Anesthesia Care Procedure:                Pre-Anesthesia Assessment:                           - Prior to the procedure, a History and Physical                            was performed, and patient medications and                            allergies were reviewed. The patient's tolerance of                            previous anesthesia was also reviewed. The risks                            and benefits of the procedure and the sedation                            options and risks were discussed with the patient.                            All questions were answered, and informed consent                            was obtained. Prior Anticoagulants: The patient has                            taken no previous anticoagulant or antiplatelet                            agents. ASA Grade Assessment: II - A patient with                            mild systemic disease. After reviewing the risks                            and benefits, the patient was deemed in                            satisfactory condition to undergo the procedure.  After obtaining informed consent, the endoscope was                            passed under direct vision. Throughout the                            procedure, the patient's blood pressure, pulse, and                            oxygen saturations were monitored continuously. The                            Model GIF-HQ190 [id number]) scope was introduced                            through the mouth, and advanced to the  second part                            of duodenum. The upper GI endoscopy was                            accomplished without difficulty. The patient                            tolerated the procedure well. Scope In: Scope Out: Findings:                 One mild benign-appearing, intrinsic stenosis was                            found at the gastroesophageal junction. This                            measured 1.4 cm (inner diameter) and was traversed.                           The exam of the esophagus was otherwise normal. No                            endoscopic evidence of Barrett's.                           Multiple 4 to 8 mm sessile polyps with no bleeding                            and no stigmata of recent bleeding were found in                            the gastric fundus and in the gastric body.                            Biopsies were taken with a cold forceps for  histology.                           A small hiatal hernia was present.                           The exam of the stomach was otherwise normal.                           The duodenal bulb and second portion of the                            duodenum were normal. Complications:            No immediate complications. Estimated Blood Loss:     Estimated blood loss was minimal. Impression:               - Benign-appearing esophageal stenosis.                           - Multiple gastric polyps. Biopsied.                           - Small hiatal hernia.                           - Normal duodenal bulb and second portion of the                            duodenum. Recommendation:           - Patient has a contact number available for                            emergencies. The signs and symptoms of potential                            delayed complications were discussed with the                            patient. Return to normal activities tomorrow.                            Written  discharge instructions were provided to the                            patient.                           - Resume previous diet.                           - Continue present medications.                           - Await pathology results.                           -  Ladene Artist, MD 02/04/2017 12:05:37 PM This report has been signed electronically.

## 2017-02-04 NOTE — Op Note (Signed)
[name] Patient Name: Selena Hunt Procedure Date: 02/04/2017 11:32 AM MRN: [id number] Endoscopist: Ladene Artist , MD Age: 66 Referring MD:  Date of Birth: 02/20/1951 Gender: Female Account #: [id number] Procedure:                Colonoscopy Indications:              Screening for colorectal malignant neoplasm Medicines:                Monitored Anesthesia Care Procedure:                Pre-Anesthesia Assessment:                           - Prior to the procedure, a History and Physical                            was performed, and patient medications and                            allergies were reviewed. The patient's tolerance of                            previous anesthesia was also reviewed. The risks                            and benefits of the procedure and the sedation                            options and risks were discussed with the patient.                            All questions were answered, and informed consent                            was obtained. Prior Anticoagulants: The patient has                            taken no previous anticoagulant or antiplatelet                            agents. ASA Grade Assessment: II - A patient with                            mild systemic disease. After reviewing the risks                            and benefits, the patient was deemed in                            satisfactory condition to undergo the procedure.                           After obtaining informed consent, the colonoscope  was passed under direct vision. Throughout the                            procedure, the patient's blood pressure, pulse, and                            oxygen saturations were monitored continuously. The                            Colonoscope was introduced through the anus and                            advanced to the the cecum, identified by                            appendiceal orifice and  ileocecal valve. The                            ileocecal valve, appendiceal orifice, and rectum                            were photographed. The quality of the bowel                            preparation was adequate. The colonoscopy was                            performed without difficulty. The patient tolerated                            the procedure well. Scope In: 11:35:29 AM Scope Out: 11:47:16 AM Scope Withdrawal Time: 0 hours 10 minutes 21 seconds  Total Procedure Duration: 0 hours 11 minutes 47 seconds  Findings:                 The perianal and digital rectal examinations were                            normal.                           Two sessile polyps were found in the descending                            colon and ascending colon. The polyps were 6 to 7                            mm in size. These polyps were removed with a cold                            snare. Resection and retrieval were complete.                           Multiple medium-mouthed diverticula were found in  the transverse colon. Purulent discharge was seen                            in association with the diverticular opening,                            consistent with diverticulitis. There was no                            evidence of diverticular bleeding. Biopsies were                            obtained.                           Many medium-mouthed diverticula were found in the                            left colon. There was no evidence of diverticular                            bleeding.                           There was evidence of a prior end-to-end                            colo-colonic anastomosis in the sigmoid colon. This                            was patent and was characterized by healthy                            appearing mucosa. The anastomosis was traversed.                           Internal hemorrhoids were found during                             retroflexion. The hemorrhoids were small and Grade                            I (internal hemorrhoids that do not prolapse).                           The exam was otherwise without abnormality on                            direct and retroflexion views. Complications:            No immediate complications. Estimated blood loss:                            None. Estimated Blood Loss:     Estimated blood loss: none. Impression:               -  Two 6 to 7 mm polyps in the descending colon and                            in the ascending colon, removed with a cold snare.                            Resected and retrieved.                           - Mild diverticulosis in the transverse colon.                            Purulent discharge was seen in association with the                            diverticular opening, indicative of diverticulitis.                            There was no evidence of diverticular bleeding.                            Biopsied.                           - Moderate diverticulosis in the left colon. There                            was no evidence of diverticular bleeding.                           - Patent end-to-end colo-colonic anastomosis,                            characterized by healthy appearing mucosa.                           - Internal hemorrhoids.                           - The examination was otherwise normal on direct                            and retroflexion views. Recommendation:           - Repeat colonoscopy in 5 years for surveillance if                            polyp(s) are precancerous, otherwise 10 years.                           - Patient has a contact number available for                            emergencies. The signs and symptoms of potential  delayed complications were discussed with the                            patient. Return to normal activities tomorrow.                            Written  discharge instructions were provided to the                            patient.                           - Resume previous diet.                           - Continue present medications.                           - Await pathology results.                           - Cipro (ciprofloxacin) 500 mg PO BID for 7 days.                           - Flagyl (metronidazole) 500 mg PO BID for 7 days. Ladene Artist, MD 02/04/2017 12:00:45 PM This report has been signed electronically.

## 2017-02-04 NOTE — Progress Notes (Signed)
Report to PACU, RN, vss, BBS= Clear.  

## 2017-02-05 ENCOUNTER — Encounter: Payer: Self-pay | Admitting: *Deleted

## 2017-02-05 ENCOUNTER — Telehealth: Payer: Self-pay | Admitting: *Deleted

## 2017-02-05 NOTE — Telephone Encounter (Signed)
Message left

## 2017-02-05 NOTE — Telephone Encounter (Signed)
error 

## 2017-02-05 NOTE — Telephone Encounter (Signed)
Message left.l

## 2017-02-14 ENCOUNTER — Encounter: Payer: Self-pay | Admitting: Gastroenterology

## 2017-02-23 ENCOUNTER — Other Ambulatory Visit: Payer: Self-pay | Admitting: Family Medicine

## 2017-03-24 ENCOUNTER — Other Ambulatory Visit: Payer: Self-pay | Admitting: Family Medicine

## 2017-03-25 NOTE — Telephone Encounter (Signed)
Last thyroid level 12/25/15

## 2017-04-11 ENCOUNTER — Telehealth: Payer: Self-pay

## 2017-04-11 MED ORDER — METOPROLOL TARTRATE 50 MG PO TABS: ORAL_TABLET | ORAL | 5 refills | Status: DC

## 2017-04-11 NOTE — Telephone Encounter (Signed)
Pharmacy called stating that patient says she takes 1/2  metoprolol 50mg  daily instead of a whole. They need new rx sent to pharmacy with correct directions. New rx sent in

## 2017-05-20 ENCOUNTER — Encounter: Payer: Self-pay | Admitting: Family Medicine

## 2017-05-20 ENCOUNTER — Ambulatory Visit (INDEPENDENT_AMBULATORY_CARE_PROVIDER_SITE_OTHER): Payer: Medicare Other | Admitting: Family Medicine

## 2017-05-20 VITALS — BP 101/54 | HR 60 | Temp 97.5°F | Ht 61.0 in | Wt 187.0 lb

## 2017-05-20 DIAGNOSIS — N390 Urinary tract infection, site not specified: Secondary | ICD-10-CM

## 2017-05-20 DIAGNOSIS — R1084 Generalized abdominal pain: Secondary | ICD-10-CM | POA: Diagnosis not present

## 2017-05-20 DIAGNOSIS — J209 Acute bronchitis, unspecified: Secondary | ICD-10-CM | POA: Diagnosis not present

## 2017-05-20 DIAGNOSIS — E8881 Metabolic syndrome: Secondary | ICD-10-CM | POA: Diagnosis not present

## 2017-05-20 DIAGNOSIS — D369 Benign neoplasm, unspecified site: Secondary | ICD-10-CM | POA: Diagnosis not present

## 2017-05-20 DIAGNOSIS — I10 Essential (primary) hypertension: Secondary | ICD-10-CM

## 2017-05-20 DIAGNOSIS — R300 Dysuria: Secondary | ICD-10-CM | POA: Diagnosis not present

## 2017-05-20 DIAGNOSIS — K219 Gastro-esophageal reflux disease without esophagitis: Secondary | ICD-10-CM | POA: Diagnosis not present

## 2017-05-20 DIAGNOSIS — E7800 Pure hypercholesterolemia, unspecified: Secondary | ICD-10-CM | POA: Diagnosis not present

## 2017-05-20 DIAGNOSIS — E559 Vitamin D deficiency, unspecified: Secondary | ICD-10-CM

## 2017-05-20 DIAGNOSIS — J069 Acute upper respiratory infection, unspecified: Secondary | ICD-10-CM

## 2017-05-20 LAB — MICROSCOPIC EXAMINATION
BACTERIA UA: NONE SEEN
RBC, UA: NONE SEEN /hpf (ref 0–?)
RENAL EPITHEL UA: NONE SEEN /HPF

## 2017-05-20 LAB — URINALYSIS, COMPLETE
BILIRUBIN UA: NEGATIVE
Glucose, UA: NEGATIVE
Ketones, UA: NEGATIVE
LEUKOCYTES UA: NEGATIVE
Nitrite, UA: NEGATIVE
PH UA: 8.5 — AB (ref 5.0–7.5)
PROTEIN UA: NEGATIVE
RBC UA: NEGATIVE
SPEC GRAV UA: 1.015 (ref 1.005–1.030)
Urobilinogen, Ur: 0.2 mg/dL (ref 0.2–1.0)

## 2017-05-20 MED ORDER — HYDROCODONE-ACETAMINOPHEN 5-325 MG PO TABS: 1.0000 | ORAL_TABLET | Freq: Four times a day (QID) | ORAL | 0 refills | Status: DC | PRN

## 2017-05-20 MED ORDER — PREDNISONE 10 MG PO TABS: ORAL_TABLET | ORAL | 0 refills | Status: DC

## 2017-05-20 MED ORDER — SYNTHROID 100 MCG PO TABS: ORAL_TABLET | ORAL | 3 refills | Status: DC

## 2017-05-20 MED ORDER — HYDROCODONE-ACETAMINOPHEN 5-325 MG PO TABS: 1.0000 | ORAL_TABLET | Freq: Every day | ORAL | 0 refills | Status: DC | PRN

## 2017-05-20 MED ORDER — METOPROLOL TARTRATE 50 MG PO TABS: ORAL_TABLET | ORAL | 3 refills | Status: DC

## 2017-05-20 MED ORDER — AZITHROMYCIN 250 MG PO TABS: ORAL_TABLET | ORAL | 1 refills | Status: DC

## 2017-05-20 NOTE — Patient Instructions (Signed)
Medicare Annual Wellness Visit  [place] and the medical providers at [facility] strive to bring you the best medical care.  In doing so we not only want to address your current medical conditions and concerns but also to detect new conditions early and prevent illness, disease and health-related problems.    Medicare offers a yearly Wellness Visit which allows our clinical staff to assess your need for preventative services including immunizations, lifestyle education, counseling to decrease risk of preventable diseases and screening for fall risk and other medical concerns.    This visit is provided free of charge (no copay) for all Medicare recipients. The clinical pharmacists at [facility] have begun to conduct these Wellness Visits which will also include a thorough review of all your medications.    As you primary medical provider recommend that you make an appointment for your Annual Wellness Visit if you have not done so already this year.  You may set up this appointment before you leave today or you may call back ([phone]) and schedule an appointment.  Please make sure when you call that you mention that you are scheduling your Annual Wellness Visit with the clinical pharmacist so that the appointment may be made for the proper length of time.     Continue current medications. Continue good therapeutic lifestyle changes which include good diet and exercise. Fall precautions discussed with patient. If an FOBT was given today- please return it to our front desk. If you are over 50 years old - you may need Prevnar 13 or the adult Pneumonia vaccine.  **Flu shots are available--- please call and schedule a FLU-CLINIC appointment**  After your visit with us today you will receive a survey in the mail or online from [employer] regarding your care with us. Please take a moment to fill this out. Your feedback is very  important to us as you can help us better understand your patient needs as well as improve your experience and satisfaction. WE CARE ABOUT YOU!!!    
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
## 2017-05-21 ENCOUNTER — Other Ambulatory Visit: Payer: Self-pay | Admitting: Family Medicine

## 2017-05-21 LAB — LIPID PANEL
CHOL/HDL RATIO: 3.2 ratio (ref 0.0–4.4)
Cholesterol, Total: 166 mg/dL (ref 100–199)
HDL: 52 mg/dL (ref >39)
LDL CALC: 90 mg/dL (ref 0–99)
TRIGLYCERIDES: 118 mg/dL (ref 0–149)
VLDL Cholesterol Cal: 24 mg/dL (ref 5–40)

## 2017-05-21 LAB — CBC WITH DIFFERENTIAL/PLATELET
BASOS: 1 %
Basophils Absolute: 0 10*3/uL (ref 0.0–0.2)
EOS (ABSOLUTE): 0.3 10*3/uL (ref 0.0–0.4)
EOS: 5 %
HEMATOCRIT: 43.5 % (ref 34.0–46.6)
HEMOGLOBIN: 14.9 g/dL (ref 11.1–15.9)
IMMATURE GRANS (ABS): 0 10*3/uL (ref 0.0–0.1)
IMMATURE GRANULOCYTES: 0 %
Lymphocytes Absolute: 1.7 10*3/uL (ref 0.7–3.1)
Lymphs: 30 %
MCH: 28.8 pg (ref 26.6–33.0)
MCHC: 34.3 g/dL (ref 31.5–35.7)
MCV: 84 fL (ref 79–97)
Monocytes Absolute: 0.6 10*3/uL (ref 0.1–0.9)
Monocytes: 10 %
NEUTROS PCT: 54 %
Neutrophils Absolute: 3.2 10*3/uL (ref 1.4–7.0)
Platelets: 212 10*3/uL (ref 150–379)
RBC: 5.17 x10E6/uL (ref 3.77–5.28)
RDW: 14.2 % (ref 12.3–15.4)
WBC: 5.8 10*3/uL (ref 3.4–10.8)

## 2017-05-21 LAB — VITAMIN D 25 HYDROXY (VIT D DEFICIENCY, FRACTURES): Vit D, 25-Hydroxy: 31.5 ng/mL (ref 30.0–100.0)

## 2017-05-21 LAB — BMP8+EGFR
BUN/Creatinine Ratio: 21 (ref 12–28)
BUN: 24 mg/dL (ref 8–27)
CO2: 24 mmol/L (ref 20–29)
CREATININE: 1.17 mg/dL — AB (ref 0.57–1.00)
Calcium: 10 mg/dL (ref 8.7–10.3)
Chloride: 105 mmol/L (ref 96–106)
GFR calc Af Amer: 55 mL/min/{1.73_m2} — ABNORMAL LOW (ref >59)
GFR, EST NON AFRICAN AMERICAN: 48 mL/min/{1.73_m2} — AB (ref >59)
GLUCOSE: 77 mg/dL (ref 65–99)
Potassium: 4.3 mmol/L (ref 3.5–5.2)
Sodium: 145 mmol/L — ABNORMAL HIGH (ref 134–144)

## 2017-05-21 LAB — HEPATIC FUNCTION PANEL
ALBUMIN: 4.5 g/dL (ref 3.6–4.8)
ALK PHOS: 93 IU/L (ref 39–117)
ALT: 19 IU/L (ref 0–32)
AST: 22 IU/L (ref 0–40)
BILIRUBIN TOTAL: 0.4 mg/dL (ref 0.0–1.2)
BILIRUBIN, DIRECT: 0.13 mg/dL (ref 0.00–0.40)
TOTAL PROTEIN: 6.5 g/dL (ref 6.0–8.5)

## 2017-05-22 LAB — URINE CULTURE

## 2017-05-23 ENCOUNTER — Telehealth: Payer: Self-pay | Admitting: Family Medicine

## 2017-05-23 MED ORDER — CEFDINIR 300 MG PO CAPS: 300.0000 mg | ORAL_CAPSULE | Freq: Two times a day (BID) | ORAL | 0 refills | Status: DC

## 2017-05-23 NOTE — Telephone Encounter (Signed)
Antibiotic pharmacy

## 2017-06-06 ENCOUNTER — Other Ambulatory Visit: Payer: Medicare Other

## 2017-06-06 DIAGNOSIS — N390 Urinary tract infection, site not specified: Secondary | ICD-10-CM

## 2017-06-06 LAB — URINALYSIS, COMPLETE
Bilirubin, UA: NEGATIVE
GLUCOSE, UA: NEGATIVE
Ketones, UA: NEGATIVE
Leukocytes, UA: NEGATIVE
NITRITE UA: NEGATIVE
Protein, UA: NEGATIVE
RBC, UA: NEGATIVE
Specific Gravity, UA: 1.02 (ref 1.005–1.030)
Urobilinogen, Ur: 0.2 mg/dL (ref 0.2–1.0)
pH, UA: 6.5 (ref 5.0–7.5)

## 2017-06-06 LAB — MICROSCOPIC EXAMINATION
BACTERIA UA: NONE SEEN
RBC MICROSCOPIC, UA: NONE SEEN /HPF (ref 0–?)
Renal Epithel, UA: NONE SEEN /hpf

## 2017-06-08 LAB — URINE CULTURE: ORGANISM ID, BACTERIA: NO GROWTH

## 2017-06-23 ENCOUNTER — Encounter (HOSPITAL_COMMUNITY): Payer: Self-pay | Admitting: Emergency Medicine

## 2017-06-23 ENCOUNTER — Other Ambulatory Visit: Payer: Self-pay

## 2017-06-23 DIAGNOSIS — I10 Essential (primary) hypertension: Secondary | ICD-10-CM | POA: Diagnosis not present

## 2017-06-23 DIAGNOSIS — Z79899 Other long term (current) drug therapy: Secondary | ICD-10-CM | POA: Diagnosis not present

## 2017-06-23 DIAGNOSIS — R1013 Epigastric pain: Secondary | ICD-10-CM | POA: Insufficient documentation

## 2017-06-23 DIAGNOSIS — R112 Nausea with vomiting, unspecified: Secondary | ICD-10-CM | POA: Insufficient documentation

## 2017-06-23 DIAGNOSIS — Z91040 Latex allergy status: Secondary | ICD-10-CM | POA: Insufficient documentation

## 2017-06-23 DIAGNOSIS — K5730 Diverticulosis of large intestine without perforation or abscess without bleeding: Secondary | ICD-10-CM | POA: Diagnosis not present

## 2017-06-23 DIAGNOSIS — I129 Hypertensive chronic kidney disease with stage 1 through stage 4 chronic kidney disease, or unspecified chronic kidney disease: Secondary | ICD-10-CM | POA: Diagnosis not present

## 2017-06-23 DIAGNOSIS — K439 Ventral hernia without obstruction or gangrene: Secondary | ICD-10-CM | POA: Diagnosis not present

## 2017-06-23 DIAGNOSIS — N189 Chronic kidney disease, unspecified: Secondary | ICD-10-CM | POA: Insufficient documentation

## 2017-06-23 LAB — COMPREHENSIVE METABOLIC PANEL
ALT: 24 U/L (ref 14–54)
AST: 25 U/L (ref 15–41)
Albumin: 4.2 g/dL (ref 3.5–5.0)
Alkaline Phosphatase: 83 U/L (ref 38–126)
Anion gap: 14 (ref 5–15)
BUN: 25 mg/dL — ABNORMAL HIGH (ref 6–20)
CHLORIDE: 102 mmol/L (ref 101–111)
CO2: 24 mmol/L (ref 22–32)
CREATININE: 1.32 mg/dL — AB (ref 0.44–1.00)
Calcium: 9.9 mg/dL (ref 8.9–10.3)
GFR, EST AFRICAN AMERICAN: 47 mL/min — AB (ref >60)
GFR, EST NON AFRICAN AMERICAN: 40 mL/min — AB (ref >60)
Glucose, Bld: 120 mg/dL — ABNORMAL HIGH (ref 65–99)
POTASSIUM: 3.3 mmol/L — AB (ref 3.5–5.1)
Sodium: 140 mmol/L (ref 135–145)
Total Bilirubin: 1 mg/dL (ref 0.3–1.2)
Total Protein: 6.5 g/dL (ref 6.5–8.1)

## 2017-06-23 LAB — CBC
HCT: 45.1 % (ref 36.0–46.0)
Hemoglobin: 15.3 g/dL — ABNORMAL HIGH (ref 12.0–15.0)
MCH: 28.5 pg (ref 26.0–34.0)
MCHC: 33.9 g/dL (ref 30.0–36.0)
MCV: 84.1 fL (ref 78.0–100.0)
PLATELETS: 211 10*3/uL (ref 150–400)
RBC: 5.36 MIL/uL — AB (ref 3.87–5.11)
RDW: 13.8 % (ref 11.5–15.5)
WBC: 12.8 10*3/uL — AB (ref 4.0–10.5)

## 2017-06-23 LAB — I-STAT TROPONIN, ED: Troponin i, poc: 0 ng/mL (ref 0.00–0.08)

## 2017-06-23 LAB — LIPASE, BLOOD: LIPASE: 36 U/L (ref 11–51)

## 2017-06-23 MED ORDER — GI COCKTAIL ~~LOC~~
30.0000 mL | Freq: Once | ORAL | Status: AC
  Administered 2017-06-23: 30 mL via ORAL
  Filled 2017-06-23: qty 30

## 2017-06-23 MED ORDER — ONDANSETRON 4 MG PO TBDP
4.0000 mg | ORAL_TABLET | Freq: Once | ORAL | Status: AC | PRN
  Administered 2017-06-23: 4 mg via ORAL
  Filled 2017-06-23: qty 1

## 2017-06-23 NOTE — ED Triage Notes (Signed)
Pt c/o epigastric pain onset today immediately after eating. Emesis x 4-5. Denies diarrhea, denies fever.

## 2017-06-24 ENCOUNTER — Emergency Department (HOSPITAL_COMMUNITY)
Admission: EM | Admit: 2017-06-24 | Discharge: 2017-06-24 | Disposition: A | Discharge status: Home / Self Care | Payer: Medicare Other | Attending: Emergency Medicine | Admitting: Emergency Medicine

## 2017-06-24 ENCOUNTER — Emergency Department (HOSPITAL_COMMUNITY): Payer: Medicare Other

## 2017-06-24 DIAGNOSIS — R1013 Epigastric pain: Secondary | ICD-10-CM

## 2017-06-24 DIAGNOSIS — K5730 Diverticulosis of large intestine without perforation or abscess without bleeding: Secondary | ICD-10-CM | POA: Diagnosis not present

## 2017-06-24 DIAGNOSIS — R112 Nausea with vomiting, unspecified: Secondary | ICD-10-CM

## 2017-06-24 DIAGNOSIS — K439 Ventral hernia without obstruction or gangrene: Secondary | ICD-10-CM | POA: Diagnosis not present

## 2017-06-24 LAB — URINALYSIS, ROUTINE W REFLEX MICROSCOPIC
Bilirubin Urine: NEGATIVE
GLUCOSE, UA: NEGATIVE mg/dL
Hgb urine dipstick: NEGATIVE
Ketones, ur: 5 mg/dL — AB
LEUKOCYTES UA: NEGATIVE
NITRITE: NEGATIVE
PH: 6 (ref 5.0–8.0)
Protein, ur: NEGATIVE mg/dL
SPECIFIC GRAVITY, URINE: 1.019 (ref 1.005–1.030)

## 2017-06-24 MED ORDER — ONDANSETRON 4 MG PO TBDP: 4.0000 mg | ORAL_TABLET | Freq: Three times a day (TID) | ORAL | 0 refills | Status: DC | PRN

## 2017-06-24 MED ORDER — IOPAMIDOL (ISOVUE-300) INJECTION 61%
INTRAVENOUS | Status: AC
  Administered 2017-06-24: 100 mL
  Filled 2017-06-24: qty 100

## 2017-06-24 NOTE — ED Notes (Signed)
Patient transported to CT 

## 2017-06-24 NOTE — ED Provider Notes (Signed)
[facility] EMERGENCY DEPARTMENT Provider Note   CSN: [id number] Arrival date & time: 06/23/17  2145     History   Chief Complaint Chief Complaint  Patient presents with  . Abdominal Pain    HPI Selena Hunt is a 67 y.o. female.  The history is provided by the patient and medical records.  Abdominal Pain   Associated symptoms include nausea and vomiting.     67 y.o. F with h xof barretts esophagus, CKD, diverticulitis, HTN, diverticulosis, meniere's disease, obesity, neuromuscular disorder, thyroid disease, seizures, GERD, presenting to the ED with abdominal pain.  Patient states she has been having issues with her stomach over the past few weeks-- states it has been intermittently upset.  She reports today she went on her walk with her family, her usual 2 miles, and states she was very hungry once returning home.  She went and fixed herself a sandwich and states she swallowed it and it felt like it sat weird in her stomach.  She took another bite and similar feeling occurred.  States she immediately went to the bathroom and had numerous episodes of projective vomiting.  States she has been vomiting most of the afternoon.  Pain at its most intense was about a 12, now about a 4 after some medications here. Pain mostly in the epigastrium but has since started to fan out, mostly to the left.  Did have some pain into the chest, but felt like burning from the acid.  She does have some bloating sensations currently.  She has had regular bowel movements, able to pass flatus.  No shortness of breath, diaphoresis, weakness. Patient reports complex GI history, followed closely with Dr. Fuller Plan.  She had endoscopy and colonoscopy in October 2018.  Prior abdominal surgeries include appendectomy, hysterectomy, partial colectomy.  Past Medical History:  Diagnosis Date  . Barrett's esophagus    no longer a issue -= 2018  . Benign neoplasm of colon   . Blood transfusion without  reported diagnosis    x 34 to date  . Chronic kidney disease   . Colon polyps   . Diverticulitis of colon (without mention of hemorrhage)(562.11)   . Diverticulosis of colon (without mention of hemorrhage)   . Essential hypertension, benign   . Female stress incontinence   . GERD (gastroesophageal reflux disease)   . Hematuria   . Hernia of unspecified site of abdominal cavity without mention of obstruction or gangrene   . Kidney stones   . Lumbosacral spondylosis without myelopathy   . Meniere disease   . Migraine headache   . Neuromuscular disorder ([place])   . Obesity   . Other and unspecified hyperlipidemia   . Pneumonia   . Pulmonary embolism ([place])   . Seizures ([place])    HX of --- from medications,2003  . Stricture and stenosis of esophagus   . Thyroid disease   . Ventral hernia     Patient Active Problem List   Diagnosis Date Noted  . Nausea without vomiting 01/21/2017  . Colon cancer screening 01/21/2017  . Abdominal pain, epigastric 01/21/2017  . Chronic GERD 01/21/2017  . AP (abdominal pain) 03/22/2015  . Ventral hernia without obstruction or gangrene 03/22/2015  . Vitamin D deficiency 09/13/2013  . Chest pain 09/04/2013  . Metabolic syndrome 51/05/5850  . Migraine headache, history of   . Lumbosacral spondylosis without myelopathy   . Diverticulitis of colon (without mention of hemorrhage)(562.11)   . Obesity   . Fatigue, chronic   .  Female stress incontinence   . Hypertension   . Hematuria   . Hyperlipidemia   . Benign neoplasm of colon   . Stricture and stenosis of esophagus     Past Surgical History:  Procedure Laterality Date  . ABDOMINAL HYSTERECTOMY  1990  . APPENDECTOMY    . [place]   Removed  . FEMORAL VARUS OSTEOTOMY W/ ADDUCTOR RELEASE AND ILIAC CREST BONE GRAFT, PELVIC OSTEOTOMY    . FEMUR FRACTURE SURGERY Left 2003  . HERNIA REPAIR     with mesh, abdominal  . HIP FRACTURE SURGERY Left   . HUMERUS FRACTURE SURGERY Left   .  LUMBAR FUSION  1995   Fusion L4 - L5  . PARTIAL COLECTOMY     Secondary to diverticulitis  . TENDON TRANSFER    . TUBAL LIGATION  1972  . ULNAR NERVE REPAIR Left     OB History    No data available       Home Medications    Prior to Admission medications   Medication Sig Start Date End Date Taking? Authorizing Provider  azithromycin (ZITHROMAX) 250 MG tablet As directed 05/20/17   Chipper Herb, MD  calcium-vitamin D (OSCAL WITH D) 500-200 MG-UNIT per tablet Take 1 tablet by mouth daily with breakfast.     [provider]  cefdinir (OMNICEF) 300 MG capsule Take 1 capsule (300 mg total) by mouth 2 (two) times daily. 05/23/17   Chipper Herb, MD  esomeprazole (NEXIUM) 40 MG capsule Take 1 capsule (40 mg total) by mouth 2 (two) times daily before a meal. 12/04/15   Chipper Herb, MD  fluticasone [facility]) 50 MCG/ACT nasal spray Place 2 sprays into both nostrils daily. Patient taking differently: Place 2 sprays into both nostrils as needed.  09/15/13   Chipper Herb, MD  HYDROcodone-acetaminophen (NORCO/VICODIN) 5-325 MG tablet Take 1 tablet by mouth daily as needed for moderate pain. 05/20/17   Chipper Herb, MD  HYDROcodone-acetaminophen (NORCO/VICODIN) 5-325 MG tablet Take 1 tablet by mouth every 6 (six) hours as needed for moderate pain. 05/20/17   Chipper Herb, MD  HYDROcodone-acetaminophen (NORCO/VICODIN) 5-325 MG tablet Take 1 tablet by mouth every 6 (six) hours as needed for moderate pain. 05/20/17   Chipper Herb, MD  meclizine (ANTIVERT) 25 MG tablet Take 25 mg by mouth every 8 (eight) hours as needed. 09/12/16   [provider]  metoprolol tartrate (LOPRESSOR) 50 MG tablet Take 1/2 tablet daily 05/20/17   Chipper Herb, MD  multivitamin-iron-minerals-folic acid (CENTRUM) chewable tablet Chew 1 tablet by mouth daily.      [provider]  oxybutynin (DITROPAN-XL) 10 MG 24 hr tablet Take 1 tablet (10 mg total) by mouth at bedtime. 05/07/16   Chipper Herb, MD  pravastatin (PRAVACHOL) 20 MG tablet TAKE ONE TABLET BY MOUTH EVERY DAY 05/21/17   Chipper Herb, MD  predniSONE (DELTASONE) 10 MG tablet Take 1 tab QID x 2 days, then 1 tab TID x 2 days, then 1 tab BID x 2 days, then 1 tab QD x 2 days 05/20/17   Chipper Herb, MD  SYNTHROID 100 MCG tablet TAKE 1 TABLET (100 MCG TOTAL) BY MOUTH DAILY except Monday and Friday take 1/2 tab. 05/20/17   Chipper Herb, MD  triamterene-hydrochlorothiazide (DYAZIDE) 37.5-25 MG capsule Take 1 each (1 capsule total) by mouth daily. 02/03/17   Chipper Herb, MD  Vitamin D, Ergocalciferol, (DRISDOL) 50000 units CAPS  capsule TAKE 1 CAPSULE BY MOUTH EVERY 7 DAYS 07/04/16   Chipper Herb, MD  solifenacin (VESICARE) 10 MG tablet Take 5 mg by mouth daily.    07/20/12  [provider]    Family History Family History  Problem Relation Age of Onset  . Alzheimer's disease Mother   . Hypertension Mother   . Stroke Mother   . Alzheimer's disease Father   . Gout Brother   . Fibromyalgia Daughter   . Hypertension Son   . Depression Son   . Spondylitis Son        spondylosis  . GER disease Son     Social History Social History   Tobacco Use  . Smoking status: Never Smoker  . Smokeless tobacco: Never Used  Substance Use Topics  . Alcohol use: Yes    Alcohol/week: 0.0 oz    Comment: wine once or twice a year  . Drug use: No     Allergies   Ambien [zolpidem tartrate]; Diovan [valsartan]; Wellbutrin [bupropion]; Codeine; Feldene [piroxicam]; Guaifenesin er; Latex; Mobic [meloxicam]; Neurontin [gabapentin]; Crestor [rosuvastatin calcium]; Lipitor [atorvastatin calcium]; Livalo [pitavastatin]; Simcor [niacin-simvastatin er]; and Zocor [simvastatin]   Review of Systems Review of Systems  Gastrointestinal: Positive for abdominal pain, nausea and vomiting.  All other systems reviewed and are negative.    Physical Exam Updated Vital Signs BP 128/79   Pulse 100   Temp 98.9 F (37.2 C)  (Oral)   Resp 20   Ht 5\' 1"  (1.549 m)   Wt 81.6 kg (180 lb)   SpO2 100%   BMI 34.01 kg/m   Physical Exam  Constitutional: She is oriented to person, place, and time. She appears well-developed and well-nourished.  HENT:  Head: Normocephalic and atraumatic.  Mouth/Throat: Oropharynx is clear and moist.  Eyes: Conjunctivae and EOM are normal. Pupils are equal, round, and reactive to light.  Neck: Normal range of motion.  Cardiovascular: Normal rate, regular rhythm and normal heart sounds.  Pulmonary/Chest: Effort normal and breath sounds normal.  Abdominal: Soft. Bowel sounds are normal. There is tenderness in the epigastric area. There is no rigidity and no guarding.    Musculoskeletal: Normal range of motion.  Neurological: She is alert and oriented to person, place, and time.  Skin: Skin is warm and dry.  Psychiatric: She has a normal mood and affect.  Nursing note and vitals reviewed.    ED Treatments / Results  Labs (all labs ordered are listed, but only abnormal results are displayed) Labs Reviewed  COMPREHENSIVE METABOLIC PANEL - Abnormal; Notable for the following components:      Result Value   Potassium 3.3 (*)    Glucose, Bld 120 (*)    BUN 25 (*)    Creatinine, Ser 1.32 (*)    GFR calc non Af Amer 40 (*)    GFR calc Af Amer 47 (*)    All other components within normal limits  CBC - Abnormal; Notable for the following components:   WBC 12.8 (*)    RBC 5.36 (*)    Hemoglobin 15.3 (*)    All other components within normal limits  LIPASE, BLOOD  URINALYSIS, ROUTINE W REFLEX MICROSCOPIC  I-STAT TROPONIN, ED    EKG  EKG Interpretation None       Radiology Ct Abdomen Pelvis W Contrast  Result Date: 06/24/2017 CLINICAL DATA:  Epigastric pain onset today after eating. Emesis times 4-5. EXAM: CT ABDOMEN AND PELVIS WITH CONTRAST TECHNIQUE: Multidetector CT imaging of the  abdomen and pelvis was performed using the standard protocol following bolus  administration of intravenous contrast. CONTRAST:  122mL ISOVUE-300 IOPAMIDOL (ISOVUE-300) INJECTION 61% COMPARISON:  03/17/2015 CT FINDINGS: Lower chest: Top-normal size heart without pericardial effusion. Chronic scarring in the right middle lobe with bronchiectasis, stable in appearance. No effusion or pneumothorax. Small hiatal hernia. Hepatobiliary: Unremarkable gallbladder. Homogeneous attenuation of the liver without space-occupying mass. No biliary dilatation. Pancreas: No ductal dilatation, mass nor inflammation of the pancreas. Spleen: Normal Adrenals/Urinary Tract: Normal bilateral adrenal glands. Pelvic right kidney with malrotation. The pelvis of the pelvic kidney faces ventral. No obstructive uropathy. Normal left kidney position and appearance. Nondistended urinary bladder without focal mural thickening or calculus. Stomach/Bowel: Nondistended stomach. Normal small bowel rotation and ligament of Treitz position. No bowel obstruction or inflammation. There is colonic diverticulosis without acute diverticulitis. Surgical sutures about the distal sigmoid colon. History of appendectomy. Vascular/Lymphatic: No significant vascular findings are present. No enlarged abdominal or pelvic lymph nodes. Reproductive: Status post hysterectomy. No adnexal masses. Other: Prior ventral hernia mesh repair with redemonstration of fascial defects along the upper and lower margins of the mesh repair. These contain short segments of colon in the upper margin of the mesh repair in small bowel adjacent to the lower margin. No bowel obstruction is seen. Musculoskeletal: No worrisome lytic or sclerotic lesions. Posterior lumbar fusion hardware the thoracolumbar junction with grade 2 anterolisthesis of L5 on S1. IMPRESSION: 1. Ventral hernia mesh repair with omental fat and bowel herniations along the cephalad and caudal margins of the repair as above described. No bowel obstruction or inflammation. 2. Colonic diverticulosis  without acute diverticulitis. 3. Chronic scarring in the right middle lobe with bronchiectasis, stable in appearance. 4. Pelvic right kidney. No nephrolithiasis nor hydroureteronephrosis bilaterally. 5. Lumbar fusion across the L5-S1 interspace with grade 2 spondylolisthesis of L5 on S1. Electronically Signed   By: Ashley Royalty M.D.   On: 06/24/2017 03:42    Procedures Procedures (including critical care time)  Medications Ordered in ED Medications  ondansetron (ZOFRAN-ODT) disintegrating tablet 4 mg (4 mg Oral Given 06/23/17 2243)  gi cocktail (Maalox,Lidocaine,Donnatal) (30 mLs Oral Given 06/23/17 2259)  iopamidol (ISOVUE-300) 61 % injection (100 mLs  Contrast Given 06/24/17 0230)     Initial Impression / Assessment and Plan / ED Course  I have reviewed the triage vital signs and the nursing notes.  Pertinent labs & imaging results that were available during my care of the patient were reviewed by me and considered in my medical decision making (see chart for details).  67 year old female presenting to the ED with epigastric pain.  Reports multiple episodes of projectile vomiting this evening.  She denies any fever or chills.  She did receive GI cocktail in triage with mild improvement of the pain but recurred shortly after.  She has had regular bowel movements, is able to pass flatus.  She has had numerous prior abdominal surgeries.  Screening labs sent and overall reassuring, slight bump in her creatinine and BUN consistent with some mild dehydration.  WBC count 12.8, normal lipase.  Will obtain CT for further evaluation.  Patient declined pain meds at initial assessment.  CT today with some omental fat and bowel herniations along the portion of prior mesh repair--patient was aware of these findings already.  There are other chronic findings, nothing else acute.  Patient's pain has steadily declined here without any medications.  She has not had any further nausea or vomiting.  At this point  given alleviation  of symptoms and negative CT, feel she is stable for discharge home.  Will monitor her symptoms closely at home.  Rx zofran PRN.  Encouraged gentle diet, progress back to normal as tolerated.  Close follow-up with PCP.  Discussed plan with patient, she acknowledged understanding and agreed with plan of care.  Return precautions given for new or worsening symptoms.  Final Clinical Impressions(s) / ED Diagnoses   Final diagnoses:  Epigastric pain  Non-intractable vomiting with nausea, unspecified vomiting type    ED Discharge Orders        Ordered    ondansetron (ZOFRAN ODT) 4 MG disintegrating tablet  Every 8 hours PRN     06/24/17 0524       Larene Pickett, PA-C 06/24/17 0540    Dina Rich Barbette Hair, MD 06/24/17 [id number]

## 2017-06-24 NOTE — Discharge Instructions (Signed)
Take the prescribed medication as directed.  Gentle diet for now and progress back to normal as tolerated. Follow-up with your primary care doctor. Return to the ED for new or worsening symptoms.

## 2017-06-25 ENCOUNTER — Other Ambulatory Visit: Payer: Self-pay | Admitting: *Deleted

## 2017-06-25 DIAGNOSIS — R1084 Generalized abdominal pain: Secondary | ICD-10-CM

## 2017-06-25 DIAGNOSIS — R112 Nausea with vomiting, unspecified: Secondary | ICD-10-CM

## 2017-07-09 ENCOUNTER — Ambulatory Visit (INDEPENDENT_AMBULATORY_CARE_PROVIDER_SITE_OTHER): Payer: Medicare Other | Admitting: Nurse Practitioner

## 2017-07-09 ENCOUNTER — Encounter: Payer: Self-pay | Admitting: Nurse Practitioner

## 2017-07-09 VITALS — BP 114/68 | HR 56 | Ht 60.0 in | Wt 185.2 lb

## 2017-07-09 DIAGNOSIS — G8929 Other chronic pain: Secondary | ICD-10-CM | POA: Diagnosis not present

## 2017-07-09 DIAGNOSIS — Z86010 Personal history of colonic polyps: Secondary | ICD-10-CM

## 2017-07-09 DIAGNOSIS — R1111 Vomiting without nausea: Secondary | ICD-10-CM | POA: Diagnosis not present

## 2017-07-09 DIAGNOSIS — R109 Unspecified abdominal pain: Secondary | ICD-10-CM

## 2017-07-09 NOTE — Patient Instructions (Signed)
If you are age 69 or older, your body mass index should be between 23-30. Your Body mass index is 36.17 kg/m. If this is out of the aforementioned range listed, please consider follow up with your Primary Care Provider.  If you are age 35 or younger, your body mass index should be between 19-25. Your Body mass index is 36.17 kg/m. If this is out of the aformentioned range listed, please consider follow up with your Primary Care Provider.   Call if recurrent vomiting.  Thank you for choosing me and [facility].   Tye Savoy, NP

## 2017-07-09 NOTE — Progress Notes (Signed)
IMPRESSION and PLAN:    #1. 67 yo female with chronic unexplained abdominal pain. Imaging, labs, EGD / colonoscopy all unrevealing. Recent ED with acute worsening of pain associated with "projectile" vomiting. Pain felt in past to be related to adhesions.  -If she has recurrent projectile vomiting, especially if associated with distention then patient will call us for KUB to evaluate for partial bowel obstruction. Abdominal exam benign today  #2. Hx of adenomatous colon polyps. Due for surveillance colonoscopy year 2023.     HPI:    Chief Complaint: Epigastric pain, gas, nausea   Patient is a 67 year old female known to Dr. Fuller Plan. She has hx of CKD, hypertension, thyroid disease, obesity and seizures. Patient is s/p tial colon resection for diverticular disease and she is s/p ventral hernia repair with mesh. We saw her in 2016 and again in Oct 2018 for abdominal pain felt to be related to adhesions.  CT scan and labs were essentially unremarkable.  EGD remarkable for multiple gastric polyps and a hiatal hernia.   Screening colonoscopy at time of EGD remarkable for diverticulosis and polyps. Two small tubular adenomas removed, no HGD. She has continued to have intermittent mid to upper abdominal pain and nausea.  Some days she feels fine, other days she is symptomatic for unclear reasons. Pain does not radiate through to the back.  Episodes are random not related to eating or movement and can last several hours.  Sometimes she has to take hydrocodone to relieve the pain.  Patient seen in the emergency department on the 19th for same abdominal pain though that time with associated vomiting. The pain was stabbing in nature.  The vomiting was described as projectile and bilious. In ED white count was minimally elevated at 12.8, CBC otherwise unremarkable.  Liver chemistries unremarkable, lipase normal.  CT scan of the abdomen pelvis with contrast negative for any acute abnormalities, the  gallbladder was unremarkable.  GI cocktail in ED helped but she wasn't given a rx for it. The pain hasn't been as severe when seen in ED.    Review of systems:   No chest pains or shortness of breath, no urinary sx.   Past Medical History:  Diagnosis Date  . Barrett's esophagus    no longer a issue -= 2018  . Benign neoplasm of colon   . Blood transfusion without reported diagnosis    x 34 to date  . Chronic kidney disease   . Colon polyps   . Diverticulitis of colon (without mention of hemorrhage)(562.11)   . Diverticulosis of colon (without mention of hemorrhage)   . Essential hypertension, benign   . Female stress incontinence   . GERD (gastroesophageal reflux disease)   . Hematuria   . Hernia of unspecified site of abdominal cavity without mention of obstruction or gangrene   . Kidney stones   . Lumbosacral spondylosis without myelopathy   . Meniere disease   . Migraine headache   . Neuromuscular disorder ([place])   . Obesity   . Other and unspecified hyperlipidemia   . Pneumonia   . Pulmonary embolism ([place])   . Seizures ([place])    HX of --- from medications,2003  . Stricture and stenosis of esophagus   . Thyroid disease   . Ventral hernia     Patient's surgical history, family medical history, social history, medications and allergies were all reviewed in Epic    Physical Exam:     BP 114/68   Pulse Marland Kitchen)  56   Ht 5' (1.524 m)   Wt 185 lb 3.2 oz (84 kg)   BMI 36.17 kg/m   GENERAL:  White female in NAD PSYCH: :Pleasant, cooperative, normal affect EENT:  conjunctiva pink, mucous membranes moist, neck supple without masses CARDIAC:  RRR, no murmur heard, no peripheral edema PULM: Normal respiratory effort, lungs CTA bilaterally, no wheezing ABDOMEN:  Nondistended, soft, nontender. No obvious masses, no hepatomegaly,  normal bowel sounds SKIN:  turgor, no lesions seen Musculoskeletal:  Normal muscle tone, normal strength NEURO: Alert and oriented x 3, no focal  neurologic deficits   Tye Savoy , NP 07/09/2017, 10:47 AM

## 2017-07-11 ENCOUNTER — Encounter: Payer: Self-pay | Admitting: Nurse Practitioner

## 2017-07-11 DIAGNOSIS — Z1231 Encounter for screening mammogram for malignant neoplasm of breast: Secondary | ICD-10-CM | POA: Diagnosis not present

## 2017-07-11 NOTE — Progress Notes (Signed)
Reviewed and agree with initial management plan.  Julissa Browning T. Charo Philipp, MD FACG 

## 2017-07-20 ENCOUNTER — Other Ambulatory Visit: Payer: Self-pay | Admitting: Family Medicine

## 2017-07-24 DIAGNOSIS — H8102 Meniere's disease, left ear: Secondary | ICD-10-CM | POA: Diagnosis not present

## 2017-07-24 DIAGNOSIS — H903 Sensorineural hearing loss, bilateral: Secondary | ICD-10-CM | POA: Diagnosis not present

## 2017-07-30 ENCOUNTER — Encounter: Payer: Self-pay | Admitting: *Deleted

## 2017-08-04 ENCOUNTER — Ambulatory Visit (INDEPENDENT_AMBULATORY_CARE_PROVIDER_SITE_OTHER): Payer: Medicare Other | Admitting: *Deleted

## 2017-08-04 VITALS — BP 99/63 | HR 52 | Temp 97.5°F | Ht 60.0 in | Wt 185.0 lb

## 2017-08-04 DIAGNOSIS — Z0000 Encounter for general adult medical examination without abnormal findings: Secondary | ICD-10-CM | POA: Diagnosis not present

## 2017-08-04 NOTE — Patient Instructions (Signed)
  Ms. Shipp , Thank you for taking time to come for your Medicare Wellness Visit. I appreciate your ongoing commitment to your health goals. Please review the following plan we discussed and let me know if I can assist you in the future.   These are the goals we discussed: Goals    . Weight (lb) < 140 lb (63.5 kg)     Wants off of meds - HTN , lipids        This is a list of the screening recommended for you and due dates:  Health Maintenance  Topic Date Due  . Flu Shot  12/31/2020*  . Stool Blood Test  02/04/2018  . Mammogram  05/20/2018  . Tetanus Vaccine  01/06/2021  . Colon Cancer Screening  02/04/2022  . DEXA scan (bone density measurement)  Completed  .  Hepatitis C: One time screening is recommended by Center for Disease Control  (CDC) for  adults born from [birth year] through 1967.   Completed  . Pneumonia vaccines  Completed  *Topic was postponed. The date shown is not the original due date.    Keep follow up with Dr Laurance Flatten and other specialist  Do not put yourself at risk for falls  / stay active

## 2017-08-04 NOTE — Progress Notes (Addendum)
Subjective:   Selena Hunt is a 67 y.o. female who presents for Medicare Annual (Subsequent) preventive examination. She is retired from [employer] She enjoys spending time with her grandchildren and reading in her free time. She states that she is doing weight watchers and eating 3 healthy meals a day. For exercise, she walks and does yoga. She is active in her church and she lives at home alone. She is widowed and her 2 children live very close to her. She does not have pets and we discussed fall hazards. She states that her health is better than it was a year ago.   Cardiac Risk Factors include: advanced age (>72men, >8 women);dyslipidemia;hypertension     Objective:     Vitals: BP 99/63 (BP Location: Left Arm)   Pulse (!) 52   Temp (!) 97.5 F (36.4 C) (Oral)   Ht 5' (1.524 m)   Wt 185 lb (83.9 kg)   BMI 36.13 kg/m   Body mass index is 36.13 kg/m.  Advanced Directives 08/04/2017 07/23/2016 09/05/2013  Does Patient Have a Medical Advance Directive? Yes Yes Patient has advance directive, copy not in chart  Type of Advance Directive [place];Living will Living will;Healthcare Power of Attorney Living will;Healthcare Power of Attorney  Does patient want to make changes to medical advance directive? - Yes (Inpatient - patient defers changing a medical advance directive at this time) No change requested  Copy of [place] in Chart? No - copy requested No - copy requested Copy requested from family  Pre-existing out of facility DNR order (yellow form or pink MOST form) - - No    Tobacco Social History   Tobacco Use  Smoking Status Never Smoker  Smokeless Tobacco Never Used     Counseling given: Not Answered   Clinical Intake:                       Past Medical History:  Diagnosis Date  . Benign neoplasm of colon   . Blood transfusion without reported diagnosis    x 34 to date  . Chronic kidney disease   .  Colon polyps   . Diverticulitis of colon (without mention of hemorrhage)(562.11)   . Diverticulosis of colon (without mention of hemorrhage)   . Essential hypertension, benign   . Female stress incontinence   . GERD (gastroesophageal reflux disease)   . Hematuria   . Hernia of unspecified site of abdominal cavity without mention of obstruction or gangrene   . Kidney stones    has had one   . Lumbosacral spondylosis without myelopathy   . Meniere disease    Drs are uncertain of this DX   . Migraine headache   . Neuromuscular disorder ([place])   . Obesity   . Other and unspecified hyperlipidemia   . Pneumonia   . Pulmonary embolism ([place])   . Seizures ([place])    HX of --- from medications,2003  . Stricture and stenosis of esophagus   . Thyroid disease   . Ventral hernia    Past Surgical History:  Procedure Laterality Date  . ABDOMINAL HYSTERECTOMY  1990  . APPENDECTOMY    . [place]   Removed  . FEMORAL VARUS OSTEOTOMY W/ ADDUCTOR RELEASE AND ILIAC CREST BONE GRAFT, PELVIC OSTEOTOMY    . FEMUR FRACTURE SURGERY Left 2003  . HERNIA REPAIR     with mesh, abdominal  . HIP FRACTURE SURGERY  Left   . HUMERUS FRACTURE SURGERY Left   . LUMBAR FUSION  1995   Fusion L4 - L5  . PARTIAL COLECTOMY     Secondary to diverticulitis  . TENDON TRANSFER    . TUBAL LIGATION  1972  . ULNAR NERVE REPAIR Left    Family History  Problem Relation Age of Onset  . Alzheimer's disease Mother   . Hypertension Mother   . Stroke Mother   . Alzheimer's disease Father   . Hypertension Father   . Gout Brother   . Fibromyalgia Daughter   . Hypertension Son   . Depression Son   . Spondylitis Son        spondylosis  . GER disease Son   . Heart disease Maternal Grandmother   . Heart disease Maternal Grandfather   . Congestive Heart Failure Maternal Grandfather   . Stroke Paternal Grandmother   . Hypertension Paternal Grandmother   . Depression Paternal Grandfather   . Suicidality  Paternal Grandfather   . Colon cancer Neg Hx   . Esophageal cancer Neg Hx   . Pancreatic cancer Neg Hx   . Stomach cancer Neg Hx   . Liver disease Neg Hx    Social History   Socioeconomic History  . Marital status: Widowed    Spouse name: Saray Capasso  . Number of children: 2  . Years of education: Not on file  . Highest education level: Not on file  Occupational History  . 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 Financial resource strain: Not on file  . Food insecurity:    Worry: Not on file    Inability: Not on file  . Transportation needs:    Medical: Not on file    Non-medical: Not on file  Tobacco Use  . Smoking status: Never Smoker  . Smokeless tobacco: Never Used  Substance and Sexual Activity  . Alcohol use: Yes    Alcohol/week: 0.0 oz    Comment: wine once or twice a year  . Drug use: No  . Sexual activity: Never    Birth control/protection: Post-menopausal  Lifestyle  . Physical activity:    Days per week: Not on file    Minutes per session: Not on file  . Stress: Not on file  Relationships  . Social connections:    Talks on phone: Not on file    Gets together: Not on file    Attends religious service: Not on file    Active member of club or organization: Not on file    Attends meetings of clubs or organizations: Not on file    Relationship status: Not on file  Other Topics Concern  . Not on file  Social History Narrative   Lives alone.      Outpatient Encounter Medications as of 08/04/2017  Medication Sig  . calcium-vitamin D (OSCAL WITH D) 500-200 MG-UNIT per tablet Take 1 tablet by mouth daily with breakfast.   . esomeprazole (NEXIUM) 40 MG capsule Take 1 capsule (40 mg total) by mouth 2 (two) times daily before a meal.  . metoprolol tartrate (LOPRESSOR) 50 MG tablet Take 1/2 tablet daily  . multivitamin-iron-minerals-folic acid (CENTRUM) chewable tablet Chew 1 tablet by mouth daily.    .  pravastatin (PRAVACHOL) 20 MG tablet TAKE ONE TABLET BY MOUTH EVERY DAY  . SYNTHROID 100 MCG tablet TAKE 1 TABLET (100 MCG TOTAL) BY MOUTH DAILY except Monday  and Friday take 1/2 tab. (Patient taking differently: TAKE 1 TABLET (100 MCG TOTAL) BY MOUTH DAILY except Monday and Friday.)  . triamterene-hydrochlorothiazide (DYAZIDE) 37.5-25 MG capsule TAKE 1 CAPSULE BY MOUTH DAILY  . Vitamin D, Ergocalciferol, (DRISDOL) 50000 units CAPS capsule TAKE 1 CAPSULE BY MOUTH EVERY 7 DAYS (Patient taking differently: TAKE 1 CAPSULE BY MOUTH EVERY 2 WEEKS)  . fluticasone (FLONASE) 50 MCG/ACT nasal spray Place 2 sprays into both nostrils daily. (Patient not taking: Reported on 08/04/2017)  . HYDROcodone-acetaminophen (NORCO/VICODIN) 5-325 MG tablet Take 1 tablet by mouth daily as needed for moderate pain. (Patient not taking: Reported on 08/04/2017)  . meclizine (ANTIVERT) 25 MG tablet Take 25 mg by mouth every 8 (eight) hours as needed.  Marland Kitchen oxybutynin (DITROPAN-XL) 10 MG 24 hr tablet Take 1 tablet (10 mg total) by mouth at bedtime. (Patient not taking: Reported on 08/04/2017)  . [DISCONTINUED] ondansetron (ZOFRAN ODT) 4 MG disintegrating tablet Take 1 tablet (4 mg total) by mouth every 8 (eight) hours as needed for nausea. (Patient not taking: Reported on 07/09/2017)  . [DISCONTINUED] solifenacin (VESICARE) 10 MG tablet Take 5 mg by mouth daily.     No facility-administered encounter medications on file as of 08/04/2017.     Activities of Daily Living In your present state of health, do you have any difficulty performing the following activities: 08/04/2017  Hearing? 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 Y  Comment wears RX glasses   Difficulty concentrating or making decisions? N  Walking or climbing stairs? Y  Comment stairs - move slow   Dressing or bathing? N  Doing errands, shopping? N  Preparing Food and eating ? N  Using the Toilet? N  In the past six months, have you accidently  leaked urine? Y  Do you have problems with loss of bowel control? N  Managing your Medications? N  Managing your Finances? N  Housekeeping or managing your Housekeeping? N  Some recent data might be hidden    Patient Care Team: Chipper Herb, MD as PCP - General (Family Medicine) Madelin Headings, DO (Optometry) Netta Cedars, MD as Consulting Physician (Orthopedic Surgery) Minus Breeding, MD as Consulting Physician (Cardiology) Thornell Sartorius, MD as Consulting Physician (Otolaryngology) Sherilyn Banker Bevely Palmer, MD as Referring Physician Richard Miu, DMD (Dentistry) Vicie Mutters, MD as Consulting Physician (Otolaryngology) Ladene Artist, MD as Consulting Physician (Gastroenterology)    Assessment:   This is a routine wellness examination for Selena Hunt.  Exercise Activities and Dietary recommendations Current Exercise Habits: Home exercise routine, Type of exercise: walking;yoga, Time (Minutes): 30, Frequency (Times/Week): 7, Weekly Exercise (Minutes/Week): 210, Intensity: Mild, Exercise limited by: orthopedic condition(s)  Goals    . Weight (lb) < 140 lb (63.5 kg)     Wants off of meds - HTN , lipids        Fall Risk Fall Risk  08/04/2017 05/20/2017 12/31/2016 09/17/2016 08/26/2016  Falls in the past year? Yes Yes Yes Yes Yes  Number falls in past yr: 1 1 2  or more 1 1  Injury with Fall? No No No Yes Yes  Comment - - - - -   Is the patient's home free of loose throw rugs in walkways, pet beds, electrical cords, etc?   Fall hazards and risks were discussed today  Depression Screen PHQ 2/9 Scores 08/04/2017 05/20/2017 12/31/2016 09/17/2016  PHQ - 2 Score 0 0 1 0     Cognitive Function MMSE - Mini  Mental State Exam 08/04/2017 07/23/2016  Orientation to time 5 5  Orientation to Place 5 5  Registration 3 3  Attention/ Calculation 5 5  Recall 3 3  Language- name 2 objects 2 2  Language- repeat 1 1  Language- follow 3 step command 3 3  Language- read & follow direction 1 1  Write a  sentence 1 1  Copy design 1 1  Total score 30 30        Immunization History  Administered Date(s) Administered  . Pneumococcal Conjugate-13 01/14/2014  . Pneumococcal Polysaccharide-23 03/06/2015  . Td 04/08/2001  . Tdap 01/07/2011    Qualifies for Shingles Vaccine? Pt declined   Screening Tests Health Maintenance  Topic Date Due  . INFLUENZA VACCINE  12/31/2020 (Originally 11/06/2017)  . COLON CANCER SCREENING ANNUAL FOBT  02/04/2018  . MAMMOGRAM  05/20/2018  . TETANUS/TDAP  01/06/2021  . COLONOSCOPY  02/04/2022  . DEXA SCAN  Completed  . Hepatitis C Screening  Completed  . PNA vac Low Risk Adult  Completed    Cancer Screenings: Lung: Low Dose CT Chest recommended if Age 58-80 years, 30 pack-year currently smoking OR have quit w/in 15years. Patient does qualify. Breast:  Up to date on Mammogram? Yes   Up to date of Bone Density/Dexa? Yes Colorectal: due at next OV   Additional Screenings: declined  Hepatitis C Screening:      Plan:   Pt is to keep follow up with Dr Laurance Flatten and other specialist. She is to stay active and prevent falls She is up to date on health maintenance.  She is scheduled for ear surgery on 08/15/17 and has clearance here scheduled for 08/13/17.  I have personally reviewed and noted the following in the patient's chart:   . Medical and social history . Use of alcohol, tobacco or illicit drugs  . Current medications and supplements . Functional ability and status . Nutritional status . Physical activity . Advanced directives . List of other physicians . Hospitalizations, surgeries, and ER visits in previous 12 months . Vitals . Screenings to include cognitive, depression, and falls . Referrals and appointments  In addition, I have reviewed and discussed with patient certain preventive protocols, quality metrics, and best practice recommendations. A written personalized care plan for preventive services as well as general preventive health  recommendations were provided to patient.     Elfriede Bonini, Cameron Proud, LPN  1/49/7026   I have reviewed and agree with the above AWV documentation.   Laroy Apple, MD [facility] 08/04/2017, 11:48 AM

## 2017-08-13 ENCOUNTER — Ambulatory Visit (INDEPENDENT_AMBULATORY_CARE_PROVIDER_SITE_OTHER): Payer: Medicare Other | Admitting: Family Medicine

## 2017-08-13 ENCOUNTER — Encounter: Payer: Self-pay | Admitting: Family Medicine

## 2017-08-13 VITALS — BP 105/56 | HR 63 | Temp 97.8°F | Ht 60.0 in | Wt 185.0 lb

## 2017-08-13 DIAGNOSIS — H918X3 Other specified hearing loss, bilateral: Secondary | ICD-10-CM

## 2017-08-13 DIAGNOSIS — R8290 Unspecified abnormal findings in urine: Secondary | ICD-10-CM | POA: Diagnosis not present

## 2017-08-13 DIAGNOSIS — Z01818 Encounter for other preprocedural examination: Secondary | ICD-10-CM

## 2017-08-13 LAB — MICROSCOPIC EXAMINATION
RBC, UA: NONE SEEN /hpf (ref 0–2)
Renal Epithel, UA: NONE SEEN /hpf

## 2017-08-13 LAB — URINALYSIS, COMPLETE
Bilirubin, UA: NEGATIVE
Glucose, UA: NEGATIVE
KETONES UA: NEGATIVE
LEUKOCYTES UA: NEGATIVE
Nitrite, UA: NEGATIVE
PROTEIN UA: NEGATIVE
RBC UA: NEGATIVE
SPEC GRAV UA: 1.02 (ref 1.005–1.030)
UUROB: 0.2 mg/dL (ref 0.2–1.0)
pH, UA: 6.5 (ref 5.0–7.5)

## 2017-08-13 MED ORDER — OXYBUTYNIN CHLORIDE ER 10 MG PO TB24: 10.0000 mg | ORAL_TABLET | Freq: Every day | ORAL | 3 refills | Status: DC
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
## 2017-08-13 NOTE — Addendum Note (Signed)
Addended by: Zannie Cove on: 08/13/2017 03:04 PM   Modules accepted: Orders

## 2017-08-13 NOTE — Patient Instructions (Signed)
We will get the required lab work and make sure that this gets to the surgical center and to the ear nose and throat physician. We will check with the pharmacy regarding the oxybutynin medication release in your intestinal tract We will send a copy of the urinalysis result and urine culture and sensitivity to the urologist

## 2017-08-14 LAB — PT AND PTT
APTT: 30 s (ref 24–33)
INR: 1 (ref 0.8–1.2)
Prothrombin Time: 10.8 s (ref 9.1–12.0)

## 2017-08-15 ENCOUNTER — Other Ambulatory Visit: Payer: Self-pay | Admitting: Family Medicine

## 2017-08-15 ENCOUNTER — Telehealth: Payer: Self-pay | Admitting: *Deleted

## 2017-08-15 DIAGNOSIS — H8102 Meniere's disease, left ear: Secondary | ICD-10-CM | POA: Diagnosis not present

## 2017-08-15 DIAGNOSIS — H903 Sensorineural hearing loss, bilateral: Secondary | ICD-10-CM | POA: Diagnosis not present

## 2017-08-15 DIAGNOSIS — H90A21 Sensorineural hearing loss, unilateral, right ear, with restricted hearing on the contralateral side: Secondary | ICD-10-CM | POA: Diagnosis not present

## 2017-08-15 LAB — URINE CULTURE

## 2017-08-15 MED ORDER — CEPHALEXIN 500 MG PO CAPS: 500.0000 mg | ORAL_CAPSULE | Freq: Two times a day (BID) | ORAL | 0 refills | Status: AC

## 2017-08-15 NOTE — Progress Notes (Signed)
Pt aware - no kelfex  - already on antibiotic

## 2017-08-15 NOTE — Telephone Encounter (Signed)
Patient aware that Cefzil should cover UTI

## 2017-08-15 NOTE — Progress Notes (Signed)
Urine culture growing Klebsiella, sensitive to cephalosporins.  Keflex 500 mg p.o. twice daily sent to pharmacy.  Please inform patient of prescription.  If she develops any worrisome symptoms or signs including fevers, worsening urinary symptoms, nausea, vomiting or gross hematuria, please have her seek immediate medical attention over the weekend.  Otherwise follow with PCP as needed.  Meds ordered this encounter  Medications  . cephALEXin (KEFLEX) 500 MG capsule    Sig: Take 1 capsule (500 mg total) by mouth 2 (two) times daily for 7 days.    Dispense:  14 capsule    Refill:  0

## 2017-08-15 NOTE — Telephone Encounter (Signed)
No.  Cefzil should cover.

## 2017-08-15 NOTE — Telephone Encounter (Signed)
Patient aware of culture results and states that she is currently taking cefzil for ear implant.  Does she need to take both

## 2017-09-02 ENCOUNTER — Telehealth: Payer: Self-pay | Admitting: Family Medicine

## 2017-09-18 ENCOUNTER — Other Ambulatory Visit: Payer: Self-pay | Admitting: *Deleted

## 2017-09-18 ENCOUNTER — Telehealth: Payer: Self-pay | Admitting: Family Medicine

## 2017-09-18 MED ORDER — HYDROCODONE-ACETAMINOPHEN 5-325 MG PO TABS: 1.0000 | ORAL_TABLET | Freq: Every day | ORAL | 0 refills | Status: DC | PRN

## 2017-09-22 ENCOUNTER — Ambulatory Visit: Payer: Medicare Other | Admitting: Family Medicine

## 2017-09-24 ENCOUNTER — Ambulatory Visit: Payer: Medicare Other | Admitting: Family Medicine

## 2017-10-01 ENCOUNTER — Ambulatory Visit: Payer: Medicare Other | Admitting: Family Medicine

## 2017-10-06 ENCOUNTER — Ambulatory Visit (INDEPENDENT_AMBULATORY_CARE_PROVIDER_SITE_OTHER): Payer: Medicare Other | Admitting: Family Medicine

## 2017-10-06 ENCOUNTER — Encounter: Payer: Self-pay | Admitting: Family Medicine

## 2017-10-06 VITALS — BP 107/68 | HR 78 | Temp 97.6°F | Ht 60.0 in | Wt 187.0 lb

## 2017-10-06 DIAGNOSIS — I10 Essential (primary) hypertension: Secondary | ICD-10-CM | POA: Diagnosis not present

## 2017-10-06 DIAGNOSIS — E7800 Pure hypercholesterolemia, unspecified: Secondary | ICD-10-CM | POA: Diagnosis not present

## 2017-10-06 DIAGNOSIS — K219 Gastro-esophageal reflux disease without esophagitis: Secondary | ICD-10-CM

## 2017-10-06 DIAGNOSIS — R52 Pain, unspecified: Secondary | ICD-10-CM

## 2017-10-06 DIAGNOSIS — E559 Vitamin D deficiency, unspecified: Secondary | ICD-10-CM | POA: Diagnosis not present

## 2017-10-06 DIAGNOSIS — Z0000 Encounter for general adult medical examination without abnormal findings: Secondary | ICD-10-CM

## 2017-10-06 MED ORDER — HYDROCODONE-ACETAMINOPHEN 5-325 MG PO TABS: 1.0000 | ORAL_TABLET | Freq: Every day | ORAL | 0 refills | Status: DC | PRN

## 2017-10-06 MED ORDER — PREDNISONE 10 MG PO TABS: ORAL_TABLET | ORAL | 0 refills | Status: DC

## 2017-10-06 NOTE — Patient Instructions (Addendum)
Medicare Annual Wellness Visit  [facility] and the medical providers at [facility] strive to bring you the best medical care.  In doing so we not only want to address your current medical conditions and concerns but also to detect new conditions early and prevent illness, disease and health-related problems.    Medicare offers a yearly Wellness Visit which allows our clinical staff to assess your need for preventative services including immunizations, lifestyle education, counseling to decrease risk of preventable diseases and screening for fall risk and other medical concerns.    This visit is provided free of charge (no copay) for all Medicare recipients. The clinical pharmacists at [facility] have begun to conduct these Wellness Visits which will also include a thorough review of all your medications.    As you primary medical provider recommend that you make an appointment for your Annual Wellness Visit if you have not done so already this year.  You may set up this appointment before you leave today or you may call back ([phone]) and schedule an appointment.  Please make sure when you call that you mention that you are scheduling your Annual Wellness Visit with the clinical pharmacist so that the appointment may be made for the proper length of time.     Continue current medications. Continue good therapeutic lifestyle changes which include good diet and exercise. Fall precautions discussed with patient. If an FOBT was given today- please return it to our front desk. If you are over 16 years old - you may need Prevnar 72 or the adult Pneumonia vaccine.  **Flu shots are available--- please call and schedule a FLU-CLINIC appointment**  After your visit with [place] today you will receive a survey in the mail or online from [employer] regarding your care with [place]. Please take a moment to fill this out. Your feedback is very  important to [place] as you can help [place] better understand your patient needs as well as improve your experience and satisfaction. WE CARE ABOUT YOU!!!   Follow-up with ear nose and throat specialist as planned If she takes the prednisone she should take it only on an every other day basis and is little as possible. On the alternate day she could take extra strength Tylenol or Tylenol arthritis. That she is having increased problems with reflux she should make sure that she is taking her Nexium regularly. Avoid all NSAIDs as much as possible. She should continue to watch her diet closely and avoid caffeine. She should drink plenty water and fluids and stay well-hydrated.

## 2017-10-06 NOTE — Progress Notes (Signed)
Subjective:    Patient ID: Selena Hunt, female    DOB: April 21, 1950, 67 y.o.   MRN: [id number]  HPI Pt here for follow up and management of chronic medical problems which includes hyperlipidemia. She is taking medication regularly.  She is doing well overall.  She recently took a short course of prednisone and felt incredibly well with all of her joint pain and it really helped her with her mobility.  We are going to try one refill on this and see if she can get some more relief and she understands about the long-term effects of taking prednisone for long periods of time.  She says it just taking a half of a pill helped her considerably and she is getting ready to go on a trip and would like to have some relief.  She is also having increasing problems with reflux and cannot take anti-inflammatory medicines.  She is due to get blood work but will return fasting.  She has an FOBT at home which she will return.  She denies any chest pain or shortness of breath anymore than usual.  Since her last visit here she has had a Baha implant from the ear specialist, Dr. Thornell Mule and she is doing extremely well and is very excited about her additional ability to hear more.  She denies any trouble with her stomach including nausea vomiting diarrhea blood in the stool or black tarry bowel movements but she does say that she has some increased heartburn.  She denies any trouble with passing her water other than ongoing frequency.  She has not had any vaginal infections recently.    Patient Active Problem List   Diagnosis Date Noted  . Nausea without vomiting 01/21/2017  . Colon cancer screening 01/21/2017  . Abdominal pain, epigastric 01/21/2017  . Chronic GERD 01/21/2017  . AP (abdominal pain) 03/22/2015  . Ventral hernia without obstruction or gangrene 03/22/2015  . Vitamin D deficiency 09/13/2013  . Chest pain 09/04/2013  . Metabolic syndrome 17/51/0258  . Migraine headache, history of   . Lumbosacral  spondylosis without myelopathy   . Diverticulitis of colon (without mention of hemorrhage)(562.11)   . Obesity   . Fatigue, chronic   . Female stress incontinence   . Hypertension   . Hematuria   . Hyperlipidemia   . Benign neoplasm of colon   . Stricture and stenosis of esophagus    Outpatient Encounter Medications as of 10/06/2017  Medication Sig  . calcium-vitamin D (OSCAL WITH D) 500-200 MG-UNIT per tablet Take 1 tablet by mouth daily with breakfast.   . esomeprazole (NEXIUM) 40 MG capsule Take 1 capsule (40 mg total) by mouth 2 (two) times daily before a meal.  . fluticasone (FLONASE) 50 MCG/ACT nasal spray Place 2 sprays into both nostrils daily.  Marland Kitchen HYDROcodone-acetaminophen (NORCO/VICODIN) 5-325 MG tablet Take 1 tablet by mouth daily as needed for moderate pain.  . meclizine (ANTIVERT) 25 MG tablet Take 25 mg by mouth every 8 (eight) hours as needed.  . metoprolol tartrate (LOPRESSOR) 50 MG tablet Take 1/2 tablet daily  . multivitamin-iron-minerals-folic acid (CENTRUM) chewable tablet Chew 1 tablet by mouth daily.    Marland Kitchen oxybutynin (DITROPAN-XL) 10 MG 24 hr tablet Take 1 tablet (10 mg total) by mouth at bedtime.  . pravastatin (PRAVACHOL) 20 MG tablet TAKE ONE TABLET BY MOUTH EVERY DAY  . SYNTHROID 100 MCG tablet TAKE 1 TABLET (100 MCG TOTAL) BY MOUTH DAILY except Monday and Friday take 1/2 tab. (Patient taking  differently: TAKE 1 TABLET (100 MCG TOTAL) BY MOUTH DAILY except Monday and Friday.)  . triamterene-hydrochlorothiazide (DYAZIDE) 37.5-25 MG capsule TAKE 1 CAPSULE BY MOUTH DAILY  . Vitamin D, Ergocalciferol, (DRISDOL) 50000 units CAPS capsule TAKE 1 CAPSULE BY MOUTH EVERY 7 DAYS (Patient taking differently: TAKE 1 CAPSULE BY MOUTH EVERY 2 WEEKS)  . [DISCONTINUED] solifenacin (VESICARE) 10 MG tablet Take 5 mg by mouth daily.     No facility-administered encounter medications on file as of 10/06/2017.       Review of Systems  Constitutional: Negative.   HENT: Negative.     Eyes: Negative.   Respiratory: Negative.   Cardiovascular: Negative.   Gastrointestinal: Negative.   Endocrine: Negative.   Genitourinary: Negative.   Musculoskeletal: Negative.   Skin: Negative.   Allergic/Immunologic: Negative.   Neurological: Negative.   Hematological: Negative.   Psychiatric/Behavioral: Negative.        Objective:   Physical Exam  Constitutional: She is oriented to person, place, and time. She appears well-developed and well-nourished. No distress.  The patient is in a positive mood and feeling well right now.  HENT:  Head: Normocephalic and atraumatic.  Right Ear: External ear normal.  Left Ear: External ear normal.  Nose: Nose normal.  Mouth/Throat: Oropharynx is clear and moist.  New hearing device the Baha is implanted on her right mastoid area and is enhancing the hearing aid that is on the left.  This hearing device snaps on.  The TMs appear normal.  Eyes: Pupils are equal, round, and reactive to light. Conjunctivae and EOM are normal. Right eye exhibits no discharge. Left eye exhibits no discharge.  Neck: Normal range of motion. Neck supple. No JVD present. No thyromegaly present.  Cardiovascular: Normal rate, regular rhythm, normal heart sounds and intact distal pulses.  No murmur heard. Heart is regular at 60/min  Pulmonary/Chest: Effort normal and breath sounds normal. She has no wheezes. She has no rales.  Clear anteriorly and posteriorly  Abdominal: Soft. Bowel sounds are normal. She exhibits no mass. There is no tenderness.  Domino obesity without masses tenderness organ enlargement or bruit  Musculoskeletal: Normal range of motion. She exhibits tenderness. She exhibits no edema.  Good range of motion of right shoulder.  Patient is currently seeing orthopedics in [place].  Lymphadenopathy:    She has no cervical adenopathy.  Neurological: She is alert and oriented to person, place, and time. She has normal reflexes.  Skin: Skin is warm  and dry.  Psychiatric: She has a normal mood and affect. Her behavior is normal. Judgment and thought content normal.  Patient's mood affect and behavior are normal.  Nursing note and vitals reviewed.   BP 107/68 (BP Location: Left Arm)   Pulse 78   Temp 97.6 F (36.4 C) (Oral)   Ht 5' (1.524 m)   Wt 187 lb (84.8 kg)   BMI 36.52 kg/m        Assessment & Plan:  1. Pure hypercholesterolemia -10 you current treatment pending results of lab work - CBC with Differential/Platelet; Future - Lipid panel; Future  2. Vitamin D deficiency -Continue current treatment pending results of lab work - CBC with Differential/Platelet; Future - VITAMIN D 25 Hydroxy (Vit-D Deficiency, Fractures); Future  3. Hypertension -The blood pressure is good today and she will continue with current treatment - BMP8+EGFR; Future - CBC with Differential/Platelet; Future - Hepatic function panel; Future  4. Chronic GERD -Continue with Nexium and be careful with anti-inflammatory medicines like ibuprofen and  Aleve and Mobic. - CBC with Differential/Platelet; Future  5. Pain management -Prescriptions  written today. -5 mg of prednisone every other day as needed with no refills  6. Healthcare maintenance - Thyroid Panel With TSH; Future  Meds ordered this encounter  Medications  . HYDROcodone-acetaminophen (NORCO/VICODIN) 5-325 MG tablet    Sig: Take 1 tablet by mouth daily as needed for moderate pain.    Dispense:  30 tablet    Refill:  0    Please fill 30 days after last RX  . HYDROcodone-acetaminophen (NORCO/VICODIN) 5-325 MG tablet    Sig: Take 1 tablet by mouth daily as needed for moderate pain.    Dispense:  30 tablet    Refill:  0    Please fill 30 days after last RX  . HYDROcodone-acetaminophen (NORCO/VICODIN) 5-325 MG tablet    Sig: Take 1 tablet by mouth daily as needed for moderate pain.    Dispense:  30 tablet    Refill:  0    Please fill 30 days after last rx  . predniSONE  (DELTASONE) 10 MG tablet    Sig: Take 1 tab QID x 2 days, then 1 tab TID x 2 days, then 1 tab BID x 2 days, then 1 tab QD x 2 days.    Dispense:  20 tablet    Refill:  0   Patient Instructions                       Medicare Annual Wellness Visit  [facility] and the medical providers at [facility] strive to bring you the best medical care.  In doing so we not only want to address your current medical conditions and concerns but also to detect new conditions early and prevent illness, disease and health-related problems.    Medicare offers a yearly Wellness Visit which allows our clinical staff to assess your need for preventative services including immunizations, lifestyle education, counseling to decrease risk of preventable diseases and screening for fall risk and other medical concerns.    This visit is provided free of charge (no copay) for all Medicare recipients. The clinical pharmacists at [facility] have begun to conduct these Wellness Visits which will also include a thorough review of all your medications.    As you primary medical provider recommend that you make an appointment for your Annual Wellness Visit if you have not done so already this year.  You may set up this appointment before you leave today or you may call back ([phone]) and schedule an appointment.  Please make sure when you call that you mention that you are scheduling your Annual Wellness Visit with the clinical pharmacist so that the appointment may be made for the proper length of time.     Continue current medications. Continue good therapeutic lifestyle changes which include good diet and exercise. Fall precautions discussed with patient. If an FOBT was given today- please return it to our front desk. If you are over 67 years old - you may need Prevnar 76 or the adult Pneumonia vaccine.  **Flu shots are available--- please call and schedule a FLU-CLINIC  appointment**  After your visit with [place] today you will receive a survey in the mail or online from [employer] regarding your care with [place]. Please take a moment to fill this out. Your feedback is very important to [place] as you can help [place] better understand your patient needs as well as  improve your experience and satisfaction. WE CARE ABOUT YOU!!!   Follow-up with ear nose and throat specialist as planned If she takes the prednisone she should take it only on an every other day basis and is little as possible. On the alternate day she could take extra strength Tylenol or Tylenol arthritis. That she is having increased problems with reflux she should make sure that she is taking her Nexium regularly. Avoid all NSAIDs as much as possible. She should continue to watch her diet closely and avoid caffeine. She should drink plenty water and fluids and stay well-hydrated.  Arrie Senate MD

## 2017-10-13 ENCOUNTER — Other Ambulatory Visit: Payer: Medicare Other

## 2017-10-13 DIAGNOSIS — E7800 Pure hypercholesterolemia, unspecified: Secondary | ICD-10-CM

## 2017-10-13 DIAGNOSIS — K219 Gastro-esophageal reflux disease without esophagitis: Secondary | ICD-10-CM | POA: Diagnosis not present

## 2017-10-13 DIAGNOSIS — E559 Vitamin D deficiency, unspecified: Secondary | ICD-10-CM | POA: Diagnosis not present

## 2017-10-13 DIAGNOSIS — Z0000 Encounter for general adult medical examination without abnormal findings: Secondary | ICD-10-CM

## 2017-10-13 DIAGNOSIS — I10 Essential (primary) hypertension: Secondary | ICD-10-CM | POA: Diagnosis not present

## 2017-10-14 LAB — HEPATIC FUNCTION PANEL
ALT: 20 IU/L (ref 0–32)
AST: 20 IU/L (ref 0–40)
Albumin: 3.8 g/dL (ref 3.6–4.8)
Alkaline Phosphatase: 84 IU/L (ref 39–117)
BILIRUBIN TOTAL: 0.5 mg/dL (ref 0.0–1.2)
Bilirubin, Direct: 0.15 mg/dL (ref 0.00–0.40)
Total Protein: 5.8 g/dL — ABNORMAL LOW (ref 6.0–8.5)

## 2017-10-14 LAB — THYROID PANEL WITH TSH
Free Thyroxine Index: 1.9 (ref 1.2–4.9)
T3 UPTAKE RATIO: 28 % (ref 24–39)
T4 TOTAL: 6.9 ug/dL (ref 4.5–12.0)
TSH: 1.89 u[IU]/mL (ref 0.450–4.500)

## 2017-10-14 LAB — CBC WITH DIFFERENTIAL/PLATELET
Basophils Absolute: 0 10*3/uL (ref 0.0–0.2)
Basos: 0 %
EOS (ABSOLUTE): 0.3 10*3/uL (ref 0.0–0.4)
EOS: 6 %
HEMATOCRIT: 42.7 % (ref 34.0–46.6)
Hemoglobin: 14.5 g/dL (ref 11.1–15.9)
Immature Grans (Abs): 0 10*3/uL (ref 0.0–0.1)
Immature Granulocytes: 0 %
LYMPHS ABS: 1.2 10*3/uL (ref 0.7–3.1)
LYMPHS: 25 %
MCH: 28.4 pg (ref 26.6–33.0)
MCHC: 34 g/dL (ref 31.5–35.7)
MCV: 84 fL (ref 79–97)
MONOCYTES: 7 %
MONOS ABS: 0.3 10*3/uL (ref 0.1–0.9)
NEUTROS ABS: 3.1 10*3/uL (ref 1.4–7.0)
Neutrophils: 62 %
Platelets: 186 10*3/uL (ref 150–450)
RBC: 5.1 x10E6/uL (ref 3.77–5.28)
RDW: 14 % (ref 12.3–15.4)
WBC: 5 10*3/uL (ref 3.4–10.8)

## 2017-10-14 LAB — VITAMIN D 25 HYDROXY (VIT D DEFICIENCY, FRACTURES): VIT D 25 HYDROXY: 45.7 ng/mL (ref 30.0–100.0)

## 2017-10-14 LAB — BMP8+EGFR
BUN/Creatinine Ratio: 22 (ref 12–28)
BUN: 21 mg/dL (ref 8–27)
CO2: 25 mmol/L (ref 20–29)
Calcium: 9.5 mg/dL (ref 8.7–10.3)
Chloride: 104 mmol/L (ref 96–106)
Creatinine, Ser: 0.95 mg/dL (ref 0.57–1.00)
GFR, EST AFRICAN AMERICAN: 71 mL/min/{1.73_m2} (ref >59)
GFR, EST NON AFRICAN AMERICAN: 62 mL/min/{1.73_m2} (ref >59)
Glucose: 92 mg/dL (ref 65–99)
Potassium: 3.7 mmol/L (ref 3.5–5.2)
SODIUM: 144 mmol/L (ref 134–144)

## 2017-10-14 LAB — LIPID PANEL
Chol/HDL Ratio: 3.1 ratio (ref 0.0–4.4)
Cholesterol, Total: 175 mg/dL (ref 100–199)
HDL: 56 mg/dL (ref >39)
LDL Calculated: 96 mg/dL (ref 0–99)
TRIGLYCERIDES: 116 mg/dL (ref 0–149)
VLDL Cholesterol Cal: 23 mg/dL (ref 5–40)

## 2017-10-19 ENCOUNTER — Other Ambulatory Visit: Payer: Self-pay | Admitting: Family Medicine

## 2017-11-18 ENCOUNTER — Other Ambulatory Visit: Payer: Self-pay | Admitting: Family Medicine

## 2018-02-10 ENCOUNTER — Encounter: Payer: Self-pay | Admitting: Family Medicine

## 2018-02-10 ENCOUNTER — Ambulatory Visit (INDEPENDENT_AMBULATORY_CARE_PROVIDER_SITE_OTHER): Payer: Medicare Other | Admitting: Family Medicine

## 2018-02-10 VITALS — BP 95/59 | HR 70 | Temp 98.9°F | Ht 60.0 in | Wt 185.0 lb

## 2018-02-10 DIAGNOSIS — E559 Vitamin D deficiency, unspecified: Secondary | ICD-10-CM

## 2018-02-10 DIAGNOSIS — H539 Unspecified visual disturbance: Secondary | ICD-10-CM

## 2018-02-10 DIAGNOSIS — I10 Essential (primary) hypertension: Secondary | ICD-10-CM

## 2018-02-10 DIAGNOSIS — E039 Hypothyroidism, unspecified: Secondary | ICD-10-CM | POA: Diagnosis not present

## 2018-02-10 DIAGNOSIS — K219 Gastro-esophageal reflux disease without esophagitis: Secondary | ICD-10-CM | POA: Diagnosis not present

## 2018-02-10 DIAGNOSIS — R233 Spontaneous ecchymoses: Secondary | ICD-10-CM | POA: Diagnosis not present

## 2018-02-10 DIAGNOSIS — E7800 Pure hypercholesterolemia, unspecified: Secondary | ICD-10-CM

## 2018-02-10 MED ORDER — VITAMIN D (ERGOCALCIFEROL) 1.25 MG (50000 UNIT) PO CAPS: ORAL_CAPSULE | ORAL | 3 refills | Status: DC

## 2018-02-10 MED ORDER — HYDROCODONE-ACETAMINOPHEN 5-325 MG PO TABS: 1.0000 | ORAL_TABLET | Freq: Every day | ORAL | 0 refills | Status: DC | PRN

## 2018-02-10 MED ORDER — PREDNISONE 10 MG PO TABS: ORAL_TABLET | ORAL | 0 refills | Status: DC

## 2018-02-10 NOTE — Addendum Note (Signed)
Addended by: Zannie Cove on: 02/10/2018 11:18 AM   Modules accepted: Orders

## 2018-02-10 NOTE — Patient Instructions (Addendum)
Medicare Annual Wellness Visit  [facility] and the medical providers at [facility] strive to bring you the best medical care.  In doing so we not only want to address your current medical conditions and concerns but also to detect new conditions early and prevent illness, disease and health-related problems.    Medicare offers a yearly Wellness Visit which allows our clinical staff to assess your need for preventative services including immunizations, lifestyle education, counseling to decrease risk of preventable diseases and screening for fall risk and other medical concerns.    This visit is provided free of charge (no copay) for all Medicare recipients. The clinical pharmacists at [facility] have begun to conduct these Wellness Visits which will also include a thorough review of all your medications.    As you primary medical provider recommend that you make an appointment for your Annual Wellness Visit if you have not done so already this year.  You may set up this appointment before you leave today or you may call back ([phone]) and schedule an appointment.  Please make sure when you call that you mention that you are scheduling your Annual Wellness Visit with the clinical pharmacist so that the appointment may be made for the proper length of time.     Continue current medications. Continue good therapeutic lifestyle changes which include good diet and exercise. Fall precautions discussed with patient. If an FOBT was given today- please return it to our front desk. If you are over 61 years old - you may need Prevnar 73 or the adult Pneumonia vaccine.  **Flu shots are available--- please call and schedule a FLU-CLINIC appointment**  After your visit with [place] today you will receive a survey in the mail or online from [employer] regarding your care with [place]. Please take a moment to fill this out. Your feedback is very  important to [place] as you can help [place] better understand your patient needs as well as improve your experience and satisfaction. WE CARE ABOUT YOU!!!   Use Flonase regularly Avoid the use of overhead fans Use nasal saline regularly Continue with Mucinex.  If the cough and congestion get worse and do not get better after the next week or 10 days please call us back and we will start a Z-Pak. We will check platelet count and blood studies.  Patient denies the use of aspirin currently. May want to consider a visit to the neurologist to follow-up on these episodes of visual impairment that have occurred more frequently recently. Continue to follow-up with ear nose and throat specialist especially Dr. Thornell Mule. Continue to take Mucinex regularly for cough and congestion Take antibiotic as directed Drink plenty of fluids and stay well-hydrated Only reserve prednisone for severe cases of head congestion that does not clear with using Flonase and nasal saline. Take prednisone only as mentioned above.

## 2018-02-10 NOTE — Progress Notes (Signed)
Subjective:    Patient ID: Selena Hunt, female    DOB: 1951-02-03, 67 y.o.   MRN: [id number]  HPI Pt here for follow up and management of chronic medical problems which includes hyperlipidemia and hypertension. She is taking medication regularly.  Today the patient has several things on her complaint list which one includes the discussion of prednisone No. 2 includes cough and congestion #3 includes right foot right calf pain from 3 weeks ago and a recent bruised left eyelid.  She is requesting her pain medicines be refilled today.  She was given an FOBT to return and will be scheduling an appointment for pelvic exam and mammogram in January 2020.  She refuses to take the flu shot.  She will return to the office for fasting blood work.  The patient today denies any chest pain pressure tightness or shortness of breath other than having cough and congestion.  Cough and congestion have been going on for 1 week and started in the head and has now moved to the chest and the congestion is yellow in color.  The Mucinex helps this some.  She also complains with bruising in the left upper eyelid and she is not sure if this could have come from coughing or not.  This in conjunction with what sounds like a hematoma that has spontaneous was resolved in the right leg is concerning for increased bleeding tendencies.  She denies any trouble with nausea vomiting diarrhea blood in the stool or black tarry bowel movements.  She is passing her water well just frequently as usual.    Patient Active Problem List   Diagnosis Date Noted  . Nausea without vomiting 01/21/2017  . Colon cancer screening 01/21/2017  . Abdominal pain, epigastric 01/21/2017  . Chronic GERD 01/21/2017  . AP (abdominal pain) 03/22/2015  . Ventral hernia without obstruction or gangrene 03/22/2015  . Vitamin D deficiency 09/13/2013  . Chest pain 09/04/2013  . Metabolic syndrome 38/88/2800  . Migraine headache, history of   . Lumbosacral  spondylosis without myelopathy   . Diverticulitis of colon (without mention of hemorrhage)(562.11)   . Obesity   . Fatigue, chronic   . Female stress incontinence   . Hypertension   . Hematuria   . Hyperlipidemia   . Benign neoplasm of colon   . Stricture and stenosis of esophagus    Outpatient Encounter Medications as of 02/10/2018  Medication Sig  . calcium-vitamin D (OSCAL WITH D) 500-200 MG-UNIT per tablet Take 1 tablet by mouth daily with breakfast.   . esomeprazole (NEXIUM) 40 MG capsule Take 1 capsule (40 mg total) by mouth 2 (two) times daily before a meal.  . fluticasone (FLONASE) 50 MCG/ACT nasal spray Place 2 sprays into both nostrils daily.  Marland Kitchen HYDROcodone-acetaminophen (NORCO/VICODIN) 5-325 MG tablet Take 1 tablet by mouth daily as needed for moderate pain.  . meclizine (ANTIVERT) 25 MG tablet Take 25 mg by mouth every 8 (eight) hours as needed.  . metoprolol tartrate (LOPRESSOR) 50 MG tablet Take 1/2 tablet daily  . multivitamin-iron-minerals-folic acid (CENTRUM) chewable tablet Chew 1 tablet by mouth daily.    Marland Kitchen oxybutynin (DITROPAN-XL) 10 MG 24 hr tablet Take 1 tablet (10 mg total) by mouth at bedtime.  . pravastatin (PRAVACHOL) 20 MG tablet TAKE ONE TABLET BY MOUTH EVERY DAY  . SYNTHROID 100 MCG tablet TAKE 1 TABLET (100 MCG TOTAL) BY MOUTH DAILY except Monday and Friday take 1/2 tab. (Patient taking differently: TAKE 1 TABLET (100 MCG TOTAL)  BY MOUTH DAILY except Monday and Friday.)  . triamterene-hydrochlorothiazide (DYAZIDE) 37.5-25 MG capsule TAKE 1 CAPSULE BY MOUTH DAILY  . Vitamin D, Ergocalciferol, (DRISDOL) 50000 units CAPS capsule TAKE 1 CAPSULE BY MOUTH EVERY 7 DAYS (Patient taking differently: TAKE 1 CAPSULE BY MOUTH EVERY 2 WEEKS)  . HYDROcodone-acetaminophen (NORCO/VICODIN) 5-325 MG tablet Take 1 tablet by mouth daily as needed for moderate pain. (Patient not taking: Reported on 02/10/2018)  . HYDROcodone-acetaminophen (NORCO/VICODIN) 5-325 MG tablet Take 1 tablet  by mouth daily as needed for moderate pain. (Patient not taking: Reported on 02/10/2018)  . [DISCONTINUED] predniSONE (DELTASONE) 10 MG tablet Take 1 tab QID x 2 days, then 1 tab TID x 2 days, then 1 tab BID x 2 days, then 1 tab QD x 2 days.  . [DISCONTINUED] solifenacin (VESICARE) 10 MG tablet Take 5 mg by mouth daily.     No facility-administered encounter medications on file as of 02/10/2018.      Review of Systems  Constitutional: Negative.   HENT: Positive for congestion.   Eyes: Negative.        Bruised eyelid left eye  Respiratory: Positive for cough.   Cardiovascular: Negative.   Gastrointestinal: Negative.   Endocrine: Negative.   Genitourinary: Negative.   Musculoskeletal: Negative.   Skin: Negative.   Allergic/Immunologic: Negative.   Neurological: Negative.   Hematological: Negative.   Psychiatric/Behavioral: Negative.        Objective:   Physical Exam  Constitutional: She is oriented to person, place, and time. She appears well-developed and well-nourished. No distress.  Patient is pleasant calm and a good historian.  HENT:  Head: Normocephalic.  Right Ear: External ear normal.  Left Ear: External ear normal.  Nose: Nose normal.  Mouth/Throat: Oropharynx is clear and moist. No oropharyngeal exudate.  Bruising left upper eyelid but no bruising in the eye itself.  Eyes: Pupils are equal, round, and reactive to light. Conjunctivae and EOM are normal. Right eye exhibits no discharge. Left eye exhibits no discharge. No scleral icterus.  Neck: Normal range of motion. Neck supple. No thyromegaly present.  No bruits thyromegaly or anterior cervical adenopathy  Cardiovascular: Normal rate, regular rhythm, normal heart sounds and intact distal pulses.  No murmur heard. The heart is regular at 60/min  Pulmonary/Chest: Effort normal and breath sounds normal. No respiratory distress. She has no wheezes. She has no rales.  Clear anteriorly and posteriorly.  No wheezing  rhonchi or rales.  With coughing just a dry cough.  Breath sounds bilaterally.  Abdominal: Soft. Bowel sounds are normal. She exhibits no mass. There is no tenderness.  No liver or spleen enlargement.  No epigastric tenderness.  No masses.  Musculoskeletal: She exhibits tenderness. She exhibits no edema.  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  She is tender at the bilateral joint line of the right knee.  There is no residual bruising or hematoma from the history that she has given [place].  Lymphadenopathy:    She has no cervical adenopathy.  Neurological: She is alert and oriented to person, place, and time. She has normal reflexes. No cranial nerve deficit.  Skin: Skin is warm and dry. No rash noted.  Psychiatric: She has a normal mood and affect. Her behavior is normal. Judgment and thought content normal.  His mood affect and behavior are all normal for her.  Nursing note and vitals reviewed.   BP (!) 95/59 (BP Location: Right Arm)   Pulse 70   Temp 98.9 F (37.2 C) (  Oral)   Ht 5' (1.524 m)   Wt 185 lb (83.9 kg)   BMI 36.13 kg/m   Vision check before patient leaves the office     Assessment & Plan:  1. Vitamin D deficiency - CBC with Differential/Platelet; Future - VITAMIN D 25 Hydroxy (Vit-D Deficiency, Fractures); Future  2. Pure hypercholesterolemia -Continue with pravastatin pending results of lab work - CBC with Differential/Platelet; Future - Lipid panel; Future  3. Hypertension - BMP8+EGFR; Future - CBC with Differential/Platelet; Future - Hepatic function panel; Future  4. Chronic GERD -Continue to watch diet closely and continue with Nexium. - CBC with Differential/Platelet; Future  5. Hypothyroidism, unspecified type - Thyroid Panel With TSH; Future  6.  Bronchitis -Continue with Mucinex and lots of fluids and if respiratory condition worsens or does not get better after another week patient should call us back and we will give her a prescription  for a Z-Pak.  7.  Increased bruising -This occurred both behind the right knee and in the left orbit or upper lid on the left side.  Meds ordered this encounter  Medications  . HYDROcodone-acetaminophen (NORCO/VICODIN) 5-325 MG tablet    Sig: Take 1 tablet by mouth daily as needed for moderate pain.    Dispense:  30 tablet    Refill:  0    Please fill 30 days after last RX  . HYDROcodone-acetaminophen (NORCO/VICODIN) 5-325 MG tablet    Sig: Take 1 tablet by mouth daily as needed for moderate pain.    Dispense:  30 tablet    Refill:  0    Please fill 30 days after last RX  . HYDROcodone-acetaminophen (NORCO/VICODIN) 5-325 MG tablet    Sig: Take 1 tablet by mouth daily as needed for moderate pain.    Dispense:  30 tablet    Refill:  0    Please fill 30 days after last rx  . predniSONE (DELTASONE) 10 MG tablet    Sig: Take 1 tab QID x 2 days, then 1 tab TID x 2 days, then 1 tab BID x 2 days, then 1 tab QD x 2 days.    Dispense:  20 tablet    Refill:  0   Patient Instructions                       Medicare Annual Wellness Visit  [facility] and the medical providers at [facility] strive to bring you the best medical care.  In doing so we not only want to address your current medical conditions and concerns but also to detect new conditions early and prevent illness, disease and health-related problems.    Medicare offers a yearly Wellness Visit which allows our clinical staff to assess your need for preventative services including immunizations, lifestyle education, counseling to decrease risk of preventable diseases and screening for fall risk and other medical concerns.    This visit is provided free of charge (no copay) for all Medicare recipients. The clinical pharmacists at [facility] have begun to conduct these Wellness Visits which will also include a thorough review of all your medications.    As you primary medical provider  recommend that you make an appointment for your Annual Wellness Visit if you have not done so already this year.  You may set up this appointment before you leave today or you may call back [phone]) and schedule an appointment.  Please make sure when you call that you  mention that you are scheduling your Annual Wellness Visit with the clinical pharmacist so that the appointment may be made for the proper length of time.     Continue current medications. Continue good therapeutic lifestyle changes which include good diet and exercise. Fall precautions discussed with patient. If an FOBT was given today- please return it to our front desk. If you are over 58 years old - you may need Prevnar 19 or the adult Pneumonia vaccine.  **Flu shots are available--- please call and schedule a FLU-CLINIC appointment**  After your visit with [place] today you will receive a survey in the mail or online from [employer] regarding your care with [place]. Please take a moment to fill this out. Your feedback is very important to [place] as you can help [place] better understand your patient needs as well as improve your experience and satisfaction. WE CARE ABOUT YOU!!!   Use Flonase regularly Avoid the use of overhead fans Use nasal saline regularly Continue with Mucinex.  If the cough and congestion get worse and do not get better after the next week or 10 days please call us back and we will start a Z-Pak. We will check platelet count and blood studies.  Patient denies the use of aspirin currently. May want to consider a visit to the neurologist to follow-up on these episodes of visual impairment that have occurred more frequently recently. Continue to follow-up with ear nose and throat specialist especially Dr. Thornell Mule. Continue to take Mucinex regularly for cough and congestion Take antibiotic as directed Drink plenty of fluids and stay well-hydrated Only reserve prednisone for severe cases of head congestion that does not clear  with using Flonase and nasal saline. Take prednisone only as mentioned above.  Arrie Senate MD

## 2018-02-12 ENCOUNTER — Other Ambulatory Visit: Payer: Self-pay | Admitting: *Deleted

## 2018-02-12 MED ORDER — AZITHROMYCIN 250 MG PO TABS: ORAL_TABLET | ORAL | 0 refills | Status: DC

## 2018-02-12 MED ORDER — BENZONATATE 100 MG PO CAPS: 100.0000 mg | ORAL_CAPSULE | Freq: Two times a day (BID) | ORAL | 0 refills | Status: DC | PRN

## 2018-02-12 NOTE — Progress Notes (Signed)
Pt called back and states that DR Laurance Flatten has said if she was not better with the cold/ sinus in a few days - to call back for following meds: Tessalon pearles and zpak

## 2018-02-16 ENCOUNTER — Other Ambulatory Visit: Payer: Medicare Other

## 2018-02-16 DIAGNOSIS — E039 Hypothyroidism, unspecified: Secondary | ICD-10-CM | POA: Diagnosis not present

## 2018-02-16 DIAGNOSIS — K219 Gastro-esophageal reflux disease without esophagitis: Secondary | ICD-10-CM | POA: Diagnosis not present

## 2018-02-16 DIAGNOSIS — I10 Essential (primary) hypertension: Secondary | ICD-10-CM | POA: Diagnosis not present

## 2018-02-16 DIAGNOSIS — E7800 Pure hypercholesterolemia, unspecified: Secondary | ICD-10-CM | POA: Diagnosis not present

## 2018-02-16 DIAGNOSIS — E559 Vitamin D deficiency, unspecified: Secondary | ICD-10-CM

## 2018-02-17 ENCOUNTER — Other Ambulatory Visit: Payer: Self-pay | Admitting: *Deleted

## 2018-02-17 ENCOUNTER — Telehealth: Payer: Self-pay | Admitting: Family Medicine

## 2018-02-17 DIAGNOSIS — E870 Hyperosmolality and hypernatremia: Secondary | ICD-10-CM

## 2018-02-17 DIAGNOSIS — E876 Hypokalemia: Secondary | ICD-10-CM

## 2018-02-17 LAB — BMP8+EGFR
BUN / CREAT RATIO: 22 (ref 12–28)
BUN: 22 mg/dL (ref 8–27)
CO2: 27 mmol/L (ref 20–29)
Calcium: 9.5 mg/dL (ref 8.7–10.3)
Chloride: 103 mmol/L (ref 96–106)
Creatinine, Ser: 1.01 mg/dL — ABNORMAL HIGH (ref 0.57–1.00)
GFR calc Af Amer: 66 mL/min/{1.73_m2} (ref >59)
GFR calc non Af Amer: 57 mL/min/{1.73_m2} — ABNORMAL LOW (ref >59)
GLUCOSE: 82 mg/dL (ref 65–99)
POTASSIUM: 3.4 mmol/L — AB (ref 3.5–5.2)
SODIUM: 147 mmol/L — AB (ref 134–144)

## 2018-02-17 LAB — LIPID PANEL
CHOL/HDL RATIO: 3.2 ratio (ref 0.0–4.4)
Cholesterol, Total: 155 mg/dL (ref 100–199)
HDL: 48 mg/dL (ref >39)
LDL Calculated: 85 mg/dL (ref 0–99)
Triglycerides: 111 mg/dL (ref 0–149)
VLDL CHOLESTEROL CAL: 22 mg/dL (ref 5–40)

## 2018-02-17 LAB — CBC WITH DIFFERENTIAL/PLATELET
BASOS ABS: 0.1 10*3/uL (ref 0.0–0.2)
Basos: 1 %
EOS (ABSOLUTE): 0.3 10*3/uL (ref 0.0–0.4)
Eos: 4 %
Hematocrit: 42.2 % (ref 34.0–46.6)
Hemoglobin: 13.9 g/dL (ref 11.1–15.9)
Immature Grans (Abs): 0 10*3/uL (ref 0.0–0.1)
Immature Granulocytes: 0 %
LYMPHS ABS: 1.4 10*3/uL (ref 0.7–3.1)
Lymphs: 18 %
MCH: 28.1 pg (ref 26.6–33.0)
MCHC: 32.9 g/dL (ref 31.5–35.7)
MCV: 85 fL (ref 79–97)
MONOS ABS: 0.7 10*3/uL (ref 0.1–0.9)
Monocytes: 9 %
Neutrophils Absolute: 5.3 10*3/uL (ref 1.4–7.0)
Neutrophils: 68 %
Platelets: 240 10*3/uL (ref 150–450)
RBC: 4.95 x10E6/uL (ref 3.77–5.28)
RDW: 12.4 % (ref 12.3–15.4)
WBC: 7.8 10*3/uL (ref 3.4–10.8)

## 2018-02-17 LAB — HEPATIC FUNCTION PANEL
ALBUMIN: 4 g/dL (ref 3.6–4.8)
ALT: 16 IU/L (ref 0–32)
AST: 16 IU/L (ref 0–40)
Alkaline Phosphatase: 92 IU/L (ref 39–117)
BILIRUBIN TOTAL: 0.6 mg/dL (ref 0.0–1.2)
BILIRUBIN, DIRECT: 0.17 mg/dL (ref 0.00–0.40)
TOTAL PROTEIN: 5.9 g/dL — AB (ref 6.0–8.5)

## 2018-02-17 LAB — THYROID PANEL WITH TSH
FREE THYROXINE INDEX: 2 (ref 1.2–4.9)
T3 Uptake Ratio: 25 % (ref 24–39)
T4, Total: 7.8 ug/dL (ref 4.5–12.0)
TSH: 1.67 u[IU]/mL (ref 0.450–4.500)

## 2018-02-17 LAB — VITAMIN D 25 HYDROXY (VIT D DEFICIENCY, FRACTURES): Vit D, 25-Hydroxy: 37.8 ng/mL (ref 30.0–100.0)

## 2018-02-17 NOTE — Telephone Encounter (Signed)
Aware. 

## 2018-03-02 ENCOUNTER — Other Ambulatory Visit: Payer: Medicare Other

## 2018-03-02 DIAGNOSIS — E870 Hyperosmolality and hypernatremia: Secondary | ICD-10-CM | POA: Diagnosis not present

## 2018-03-02 DIAGNOSIS — R300 Dysuria: Secondary | ICD-10-CM | POA: Diagnosis not present

## 2018-03-02 DIAGNOSIS — E876 Hypokalemia: Secondary | ICD-10-CM | POA: Diagnosis not present

## 2018-03-02 LAB — MICROSCOPIC EXAMINATION

## 2018-03-02 LAB — URINALYSIS, COMPLETE
BILIRUBIN UA: NEGATIVE
GLUCOSE, UA: NEGATIVE
KETONES UA: NEGATIVE
Nitrite, UA: NEGATIVE
PROTEIN UA: NEGATIVE
RBC, UA: NEGATIVE
SPEC GRAV UA: 1.015 (ref 1.005–1.030)
Urobilinogen, Ur: 0.2 mg/dL (ref 0.2–1.0)
pH, UA: 7 (ref 5.0–7.5)

## 2018-03-02 LAB — BMP8+EGFR
BUN / CREAT RATIO: 18 (ref 12–28)
BUN: 20 mg/dL (ref 8–27)
CALCIUM: 9.7 mg/dL (ref 8.7–10.3)
CHLORIDE: 102 mmol/L (ref 96–106)
CO2: 24 mmol/L (ref 20–29)
Creatinine, Ser: 1.12 mg/dL — ABNORMAL HIGH (ref 0.57–1.00)
GFR, EST AFRICAN AMERICAN: 58 mL/min/{1.73_m2} — AB (ref >59)
GFR, EST NON AFRICAN AMERICAN: 50 mL/min/{1.73_m2} — AB (ref >59)
Glucose: 76 mg/dL (ref 65–99)
POTASSIUM: 3.8 mmol/L (ref 3.5–5.2)
Sodium: 141 mmol/L (ref 134–144)

## 2018-03-05 LAB — URINE CULTURE

## 2018-03-09 ENCOUNTER — Other Ambulatory Visit: Payer: Self-pay | Admitting: *Deleted

## 2018-03-09 DIAGNOSIS — N3000 Acute cystitis without hematuria: Secondary | ICD-10-CM

## 2018-03-09 MED ORDER — FLUCONAZOLE 150 MG PO TABS: 150.0000 mg | ORAL_TABLET | Freq: Once | ORAL | 0 refills | Status: DC

## 2018-03-09 MED ORDER — CEFDINIR 300 MG PO CAPS: 300.0000 mg | ORAL_CAPSULE | Freq: Two times a day (BID) | ORAL | 0 refills | Status: DC

## 2018-04-02 ENCOUNTER — Encounter: Payer: Self-pay | Admitting: Family Medicine

## 2018-04-02 ENCOUNTER — Ambulatory Visit (INDEPENDENT_AMBULATORY_CARE_PROVIDER_SITE_OTHER): Payer: Medicare Other

## 2018-04-02 ENCOUNTER — Ambulatory Visit (INDEPENDENT_AMBULATORY_CARE_PROVIDER_SITE_OTHER): Payer: Medicare Other | Admitting: Family Medicine

## 2018-04-02 VITALS — BP 109/63 | HR 55 | Temp 98.2°F | Ht 60.0 in | Wt 183.2 lb

## 2018-04-02 DIAGNOSIS — R05 Cough: Secondary | ICD-10-CM

## 2018-04-02 DIAGNOSIS — R059 Cough, unspecified: Secondary | ICD-10-CM

## 2018-04-02 MED ORDER — LEVOFLOXACIN 500 MG PO TABS: 500.0000 mg | ORAL_TABLET | Freq: Every day | ORAL | 0 refills | Status: DC

## 2018-04-02 NOTE — Progress Notes (Signed)
Chief Complaint  Patient presents with  . Cough    pt here today c/o fever and really bad cough with "blood streaked" phlegm    HPI  Patient presents today for persistent cough.  Did not respond well to a Z-Pak given couple of weeks ago.  In fact now she is coughing up increasing amounts of purulent matter.  Last night it became blood-streaked and stringy.  She says she cannot get of breath deeply because she will feel discomfort in her left lower lateral lung field and it will cut off her breath momentarily.  Other than that she is not short of breath.  She had a fever of almost 102 degrees yesterday evening.  Increasing lassitude as well. PMH: Smoking status noted ROS: Per HPI  Objective: BP 109/63   Pulse (!) 55   Temp 98.2 F (36.8 C) (Oral)   Ht 5' (1.524 m)   Wt 183 lb 4 oz (83.1 kg)   BMI 35.79 kg/m  Gen: NAD, alert, cooperative with exam HEENT: NCAT, EOMI, PERRL CV: RRR, good S1/S2, no murmur Resp: Left lower lobe rales noted Abd: SNTND,Ext: No edema, warm Neuro: Alert and oriented, No gross deficits CXR - scarring, but no acute infiltrate  Assessment and plan:  1. Cough     Meds ordered this encounter  Medications  . levofloxacin (LEVAQUIN) 500 MG tablet    Sig: Take 1 tablet (500 mg total) by mouth daily. For 10 days    Dispense:  10 tablet    Refill:  0    Orders Placed This Encounter  Procedures  . DG Chest 2 View    Standing Status:   Future    Number of Occurrences:   1    Standing Expiration Date:   06/02/2019    Order Specific Question:   Reason for Exam (SYMPTOM  OR DIAGNOSIS REQUIRED)    Answer:   cough, dyspnea, COPD    Order Specific Question:   Preferred imaging location?    Answer:   Internal    Follow up as needed.  Claretta Fraise, MD

## 2018-04-21 ENCOUNTER — Telehealth: Payer: Self-pay | Admitting: Neurology

## 2018-04-21 ENCOUNTER — Ambulatory Visit (INDEPENDENT_AMBULATORY_CARE_PROVIDER_SITE_OTHER): Payer: Medicare Other | Admitting: Neurology

## 2018-04-21 ENCOUNTER — Encounter: Payer: Self-pay | Admitting: Neurology

## 2018-04-21 VITALS — BP 123/63 | HR 59 | Ht 60.0 in | Wt 196.0 lb

## 2018-04-21 DIAGNOSIS — G43809 Other migraine, not intractable, without status migrainosus: Secondary | ICD-10-CM

## 2018-04-21 DIAGNOSIS — Z87820 Personal history of traumatic brain injury: Secondary | ICD-10-CM | POA: Diagnosis not present

## 2018-04-21 DIAGNOSIS — H539 Unspecified visual disturbance: Secondary | ICD-10-CM

## 2018-04-21 DIAGNOSIS — G4489 Other headache syndrome: Secondary | ICD-10-CM

## 2018-04-21 HISTORY — DX: Personal history of traumatic brain injury: Z87.820

## 2018-04-21 NOTE — Telephone Encounter (Signed)
Medicare/[place] [facility] [place] lvm for pt to be aware. Left GI phone number of [phone] and to give them a call if she has not heard from them to give them a call at [phone].
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
## 2018-04-23 ENCOUNTER — Other Ambulatory Visit: Payer: Self-pay | Admitting: Family Medicine

## 2018-05-01 ENCOUNTER — Ambulatory Visit
Admission: RE | Admit: 2018-05-01 | Discharge: 2018-05-01 | Disposition: A | Discharge status: Home / Self Care | Payer: Medicare Other | Source: Ambulatory Visit | Attending: Neurology | Admitting: Neurology

## 2018-05-01 DIAGNOSIS — G4489 Other headache syndrome: Secondary | ICD-10-CM | POA: Diagnosis not present

## 2018-05-01 DIAGNOSIS — H539 Unspecified visual disturbance: Secondary | ICD-10-CM

## 2018-05-01 DIAGNOSIS — R51 Headache: Secondary | ICD-10-CM | POA: Diagnosis not present

## 2018-05-03 ENCOUNTER — Telehealth: Payer: Self-pay | Admitting: Neurology

## 2018-05-03 NOTE — Telephone Encounter (Signed)
  I called the patient.  The CT of the head is normal.  No evidence of occipital contusions as reported previously.  If the patient has new symptoms, she is to contact our office.   CT head 05/03/18:  IMPRESSION: This CT scan of the head without contrast shows the following: 1.    The brain appears normal. 2.    BAHA implant for hearing on the right 3.    There are no acute findings.

## 2018-05-08 ENCOUNTER — Telehealth: Payer: Self-pay | Admitting: Family Medicine

## 2018-05-08 MED ORDER — FLUTICASONE PROPIONATE 50 MCG/ACT NA SUSP: 2.0000 | Freq: Every day | NASAL | 6 refills | Status: AC

## 2018-05-08 NOTE — Telephone Encounter (Signed)
Patient of [facility]. She is having sinus congestion, productive cough with green sputum, no fever. She has been taking mucinex and that has helped some. Would like meds called in. Please review and advise

## 2018-05-08 NOTE — Telephone Encounter (Signed)
Pt aware and voiced understanding 

## 2018-05-08 NOTE — Telephone Encounter (Signed)
I sent Flonase for her, if it worsens or she develops any fevers and let us know but go ahead and use the Flonase and some nasal saline and use an allergy medicine and if it does not improve then give [place] a call back.

## 2018-05-15 ENCOUNTER — Telehealth: Payer: Self-pay | Admitting: Family Medicine

## 2018-05-15 DIAGNOSIS — Z20828 Contact with and (suspected) exposure to other viral communicable diseases: Secondary | ICD-10-CM

## 2018-05-15 MED ORDER — OSELTAMIVIR PHOSPHATE 75 MG PO CAPS: 75.0000 mg | ORAL_CAPSULE | Freq: Every day | ORAL | 0 refills | Status: DC

## 2018-05-15 NOTE — Telephone Encounter (Signed)
Pharmacy: [facility]   PT was exposed to flu, grandson she keeps was diagnosed with the flu and pt is running fever of 100 and would like to have tamiflu sent to pharmacy.

## 2018-05-15 NOTE — Telephone Encounter (Signed)
Med sent pt aware  

## 2018-05-20 ENCOUNTER — Other Ambulatory Visit: Payer: Self-pay | Admitting: Family Medicine

## 2018-05-21 ENCOUNTER — Other Ambulatory Visit: Payer: Self-pay | Admitting: Family Medicine

## 2018-06-25 ENCOUNTER — Ambulatory Visit: Payer: Medicare Other | Admitting: Family Medicine

## 2018-06-25 ENCOUNTER — Telehealth: Payer: Self-pay | Admitting: *Deleted

## 2018-06-25 MED ORDER — PREDNISONE 10 MG PO TABS: ORAL_TABLET | ORAL | 0 refills | Status: DC

## 2018-06-25 MED ORDER — AZITHROMYCIN 250 MG PO TABS: ORAL_TABLET | ORAL | 0 refills | Status: DC

## 2018-06-25 NOTE — Telephone Encounter (Signed)
This is okay to do this and in addition she should use nasal saline and take Tylenol as needed for aches pains and fever.  Drink plenty of fluids and stay well-hydrated

## 2018-06-25 NOTE — Telephone Encounter (Signed)
Meds sent in / son aware

## 2018-06-29 ENCOUNTER — Ambulatory Visit: Payer: Medicare Other | Admitting: Family Medicine

## 2018-07-07 ENCOUNTER — Telehealth: Payer: Self-pay | Admitting: Family Medicine

## 2018-07-07 MED ORDER — HYDROCODONE-ACETAMINOPHEN 5-325 MG PO TABS: 1.0000 | ORAL_TABLET | Freq: Every day | ORAL | 0 refills | Status: DC | PRN

## 2018-07-27 ENCOUNTER — Other Ambulatory Visit: Payer: Self-pay | Admitting: Family Medicine

## 2018-07-27 ENCOUNTER — Ambulatory Visit (INDEPENDENT_AMBULATORY_CARE_PROVIDER_SITE_OTHER): Payer: Medicare Other | Admitting: Family Medicine

## 2018-07-27 ENCOUNTER — Other Ambulatory Visit: Payer: Self-pay

## 2018-07-27 ENCOUNTER — Telehealth: Payer: Self-pay | Admitting: Family Medicine

## 2018-07-27 DIAGNOSIS — N3000 Acute cystitis without hematuria: Secondary | ICD-10-CM

## 2018-07-27 MED ORDER — CEFDINIR 300 MG PO CAPS: 300.0000 mg | ORAL_CAPSULE | Freq: Two times a day (BID) | ORAL | 0 refills | Status: DC

## 2018-07-27 NOTE — Progress Notes (Signed)
Telephone visit  Subjective: CC: UTI PCP: Chipper Herb, MD HWT:UUEK E Selena Hunt is a 68 y.o. female calls for telephone consult today. Patient provides verbal consent for consult held via phone.  Location of patient: home Location of provider: [facility] Others present for call: none  1. UTI Patient reports onset of dysuria, urinary odor, cloudy urine a few days ago.  She has been taking AZO and cranberry tablets and the dysuria has somewhat resolved but she has persistent odor and discoloration of the urine.  She denies any associated abdominal pain, nausea, vomiting, fevers or back pain.  She thought perhaps she had scant hematuria on her pad but has not visualized anything recently.  She has history of urinary tract infections.  She has not seen a urologist.   ROS: Per HPI  Allergies  Allergen Reactions  . Ambien [Zolpidem Tartrate] Anxiety  . Diovan [Valsartan] Other (See Comments)    Seizures.  . Wellbutrin [Bupropion] Other (See Comments)    Seizures.  . Codeine     Feel crazy and dizzy   . Feldene [Piroxicam]     Swelling   . Guaifenesin Er     Raise Bp / HR   . Latex     Swelling   . Mobic [Meloxicam] Other (See Comments)    Stomach cramps  . Neurontin [Gabapentin]     "loopy"  . Crestor [Rosuvastatin Calcium] Other (See Comments)    Myalgias   . Lipitor [Atorvastatin Calcium] Other (See Comments)    myalgias  . Livalo [Pitavastatin] Swelling and Other (See Comments)    stomach cramping   . Simcor [Niacin-Simvastatin Er] Other (See Comments)    Stomach cramps  . Zocor [Simvastatin] Other (See Comments)    Stomach cramps   Past Medical History:  Diagnosis Date  . Benign neoplasm of colon   . Blood transfusion without reported diagnosis    x 34 to date  . Chronic kidney disease   . Colon polyps   . Diverticulitis of colon (without mention of hemorrhage)(562.11)   . Diverticulosis of colon (without mention of hemorrhage)   . Essential hypertension, benign   .  Female stress incontinence   . GERD (gastroesophageal reflux disease)   . Hematuria   . Hernia of unspecified site of abdominal cavity without mention of obstruction or gangrene   . History of closed head injury 04/21/2018  . Kidney stones    has had one   . Lumbosacral spondylosis without myelopathy   . Meniere disease    Drs are uncertain of this DX   . Migraine headache   . Neuromuscular disorder ([place])   . Obesity   . Other and unspecified hyperlipidemia   . Pneumonia   . Pulmonary embolism ([place])   . Seizures ([place])    HX of --- from medications,2003  . Stricture and stenosis of esophagus   . Thyroid disease   . Ventral hernia     Current Outpatient Medications:  .  calcium-vitamin D (OSCAL WITH D) 500-200 MG-UNIT per tablet, Take 1 tablet by mouth daily with breakfast. , Disp: , Rfl:  .  esomeprazole (NEXIUM) 40 MG capsule, Take 1 capsule (40 mg total) by mouth 2 (two) times daily before a meal., Disp: 60 capsule, Rfl: 3 .  fluticasone (FLONASE) 50 MCG/ACT nasal spray, Place 2 sprays into both nostrils daily., Disp: 16 g, Rfl: 6 .  HYDROcodone-acetaminophen (NORCO/VICODIN) 5-325 MG tablet, Take 1 tablet by mouth daily as needed for moderate pain., Disp:  30 tablet, Rfl: 0 .  HYDROcodone-acetaminophen (NORCO/VICODIN) 5-325 MG tablet, Take 1 tablet by mouth daily as needed for moderate pain., Disp: 30 tablet, Rfl: 0 .  meclizine (ANTIVERT) 25 MG tablet, Take 25 mg by mouth every 8 (eight) hours as needed., Disp: , Rfl: 2 .  metoprolol tartrate (LOPRESSOR) 50 MG tablet, TAKE 1/2 TABLET BY MOUTH EVERY DAY, Disp: 45 tablet, Rfl: 1 .  multivitamin-iron-minerals-folic acid (CENTRUM) chewable tablet, Chew 1 tablet by mouth daily.  , Disp: , Rfl:  .  oxybutynin (DITROPAN-XL) 10 MG 24 hr tablet, Take 1 tablet (10 mg total) by mouth at bedtime., Disp: 90 tablet, Rfl: 3 .  pravastatin (PRAVACHOL) 20 MG tablet, TAKE 1 TABLET BY MOUTH EVERY DAY, Disp: 30 tablet, Rfl: 2 .  SYNTHROID 100 MCG  tablet, TAKE 1 TABLET (100 MCG TOTAL) BY MOUTH DAILY except Monday and Friday take 1/2 tab. (Patient taking differently: TAKE 1 TABLET (100 MCG TOTAL) BY MOUTH DAILY except Monday and Friday.), Disp: 75 tablet, Rfl: 3 .  triamterene-hydrochlorothiazide (DYAZIDE) 37.5-25 MG capsule, TAKE ONE CAPSULE BY MOUTH DAILY, Disp: 90 capsule, Rfl: 1 .  Vitamin D, Ergocalciferol, (DRISDOL) 50000 units CAPS capsule, TAKE 1 CAPSULE BY MOUTH EVERY 7 DAYS, Disp: 12 capsule, Rfl: 3  Assessment/ Plan: 68 y.o. female   1. Acute cystitis without hematuria Clinically sounds like she is having a urinary tract infection.  I reviewed her last urine culture which demonstrated Klebsiella sensitive to cephalosporins.  She has impaired renal function but should be able to metabolize Omnicef 300 mg p.o. twice daily - cefdinir (OMNICEF) 300 MG capsule; Take 1 capsule (300 mg total) by mouth 2 (two) times daily for 7 days. 1 po BID  Dispense: 14 capsule; Refill: 0   Start time: 9:42am End time: 9:48am  Total time spent on patient care (including telephone call/ virtual visit): 15 minutes  Selena Hunt, [place] [phone]

## 2018-07-27 NOTE — Telephone Encounter (Signed)
Patient states she commonly gets urinary tract infections, and feels that she has one due to the smell of her urine.  Scheduled patient for televisit with Dr. Lajuana Ripple in after hours clinic.

## 2018-07-27 NOTE — Telephone Encounter (Signed)
Pt believes that she has UTI due to the smell her urine has, wants to speak to Roselyn Reef to see about getting omnicef sent in.   Pharmacy [facility]

## 2018-08-05 ENCOUNTER — Telehealth: Payer: Self-pay | Admitting: Family Medicine

## 2018-08-06 ENCOUNTER — Other Ambulatory Visit: Payer: Self-pay | Admitting: Family Medicine

## 2018-08-10 ENCOUNTER — Other Ambulatory Visit: Payer: Self-pay | Admitting: Family Medicine

## 2018-08-11 ENCOUNTER — Other Ambulatory Visit: Payer: Self-pay

## 2018-08-11 ENCOUNTER — Ambulatory Visit (INDEPENDENT_AMBULATORY_CARE_PROVIDER_SITE_OTHER): Payer: Medicare Other | Admitting: Family Medicine

## 2018-08-11 ENCOUNTER — Encounter: Payer: Self-pay | Admitting: Family Medicine

## 2018-08-11 DIAGNOSIS — K219 Gastro-esophageal reflux disease without esophagitis: Secondary | ICD-10-CM

## 2018-08-11 DIAGNOSIS — E039 Hypothyroidism, unspecified: Secondary | ICD-10-CM

## 2018-08-11 DIAGNOSIS — E876 Hypokalemia: Secondary | ICD-10-CM | POA: Diagnosis not present

## 2018-08-11 DIAGNOSIS — I10 Essential (primary) hypertension: Secondary | ICD-10-CM | POA: Diagnosis not present

## 2018-08-11 DIAGNOSIS — R32 Unspecified urinary incontinence: Secondary | ICD-10-CM | POA: Diagnosis not present

## 2018-08-11 DIAGNOSIS — Z87440 Personal history of urinary (tract) infections: Secondary | ICD-10-CM

## 2018-08-11 DIAGNOSIS — M47817 Spondylosis without myelopathy or radiculopathy, lumbosacral region: Secondary | ICD-10-CM | POA: Diagnosis not present

## 2018-08-11 DIAGNOSIS — K439 Ventral hernia without obstruction or gangrene: Secondary | ICD-10-CM | POA: Diagnosis not present

## 2018-08-11 DIAGNOSIS — E559 Vitamin D deficiency, unspecified: Secondary | ICD-10-CM | POA: Diagnosis not present

## 2018-08-11 DIAGNOSIS — E7800 Pure hypercholesterolemia, unspecified: Secondary | ICD-10-CM

## 2018-08-11 NOTE — Patient Instructions (Addendum)
Continue current treatment Stay active as much as physically possible and drink plenty of water and fluids and work aggressively to lose weight through diet and exercise Continue to be careful and to not put self at risk for falling Practice good hand and respiratory hygiene Please call us if the problem with the right leg swelling gets worse or does not resolve and we will schedule Dopplers of the right lower extremity for DVT Also we will go ahead and arrange 4 to 6 weeks out to have you see the urologist because of recurrent urinary tract infections and if you need to reschedule that visit you can.
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
## 2018-08-11 NOTE — Addendum Note (Signed)
Addended by: Zannie Cove on: 08/11/2018 12:03 PM   Modules accepted: Orders

## 2018-08-20 ENCOUNTER — Other Ambulatory Visit: Payer: Self-pay | Admitting: Family Medicine

## 2018-09-14 ENCOUNTER — Telehealth: Payer: Self-pay | Admitting: Family Medicine

## 2018-09-14 ENCOUNTER — Other Ambulatory Visit: Payer: Self-pay | Admitting: Family Medicine

## 2018-09-14 DIAGNOSIS — N3000 Acute cystitis without hematuria: Secondary | ICD-10-CM

## 2018-09-14 MED ORDER — HYDROCODONE-ACETAMINOPHEN 5-325 MG PO TABS: 1.0000 | ORAL_TABLET | Freq: Every day | ORAL | 0 refills | Status: DC | PRN

## 2018-09-14 MED ORDER — CALCIUM CARBONATE-VITAMIN D 500-200 MG-UNIT PO TABS: 1.0000 | ORAL_TABLET | Freq: Every day | ORAL | 1 refills | Status: AC

## 2018-09-14 MED ORDER — FLUCONAZOLE 150 MG PO TABS: 150.0000 mg | ORAL_TABLET | Freq: Every day | ORAL | 0 refills | Status: DC

## 2018-09-14 MED ORDER — CEFDINIR 300 MG PO CAPS: 300.0000 mg | ORAL_CAPSULE | Freq: Two times a day (BID) | ORAL | 0 refills | Status: AC

## 2018-09-14 MED ORDER — PREDNISONE 10 MG PO TABS: ORAL_TABLET | ORAL | 0 refills | Status: DC

## 2018-09-14 NOTE — Addendum Note (Signed)
Addended by: Zannie Cove on: 09/14/2018 11:31 AM   Modules accepted: Orders

## 2018-09-14 NOTE — Telephone Encounter (Signed)
Patient states that she is going on vacation and would like to have the Hydrocodone, omnicef, prednisone, and diflucan on hand

## 2018-09-14 NOTE — Addendum Note (Signed)
Addended by: Chevis Pretty on: 09/14/2018 01:18 PM   Modules accepted: Orders

## 2018-09-14 NOTE — Telephone Encounter (Signed)
Dr Laurance Flatten,   I have set these meds up for Refill  Please sign below.

## 2018-09-14 NOTE — Telephone Encounter (Signed)
Ok to refill these

## 2018-09-28 DIAGNOSIS — N3946 Mixed incontinence: Secondary | ICD-10-CM | POA: Diagnosis not present

## 2018-09-28 DIAGNOSIS — N952 Postmenopausal atrophic vaginitis: Secondary | ICD-10-CM | POA: Diagnosis not present

## 2018-09-28 DIAGNOSIS — Q632 Ectopic kidney: Secondary | ICD-10-CM | POA: Diagnosis not present

## 2018-09-28 DIAGNOSIS — N3020 Other chronic cystitis without hematuria: Secondary | ICD-10-CM | POA: Diagnosis not present

## 2018-10-04 IMAGING — CT CT ABD-PELV W/ CM
2 of 5 series · 15 of 46 positions shown, 17 images · IV contrast (iopamidol)
Comparison: 03/17/2015 CT

CLINICAL DATA: Epigastric pain onset today after eating. Emesis
times 4-5.

EXAM:
CT ABDOMEN AND PELVIS WITH CONTRAST
TECHNIQUE: Multidetector CT imaging of the abdomen and pelvis was performed
using the standard protocol following bolus administration of
intravenous contrast.
CONTRAST:  100mL XISD99-CNN IOPAMIDOL (XISD99-CNN) INJECTION 61%

[Series 3: abdomen 5.0 · axial · 0.66mm/px · z∈[+686,+1062]mm · 12 of 89 slices shown, 14 images]
[im 7/89  soft-tissue]
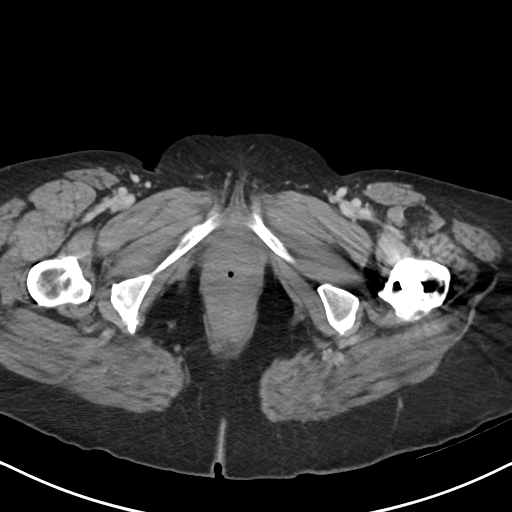
[im 7/89  bone]
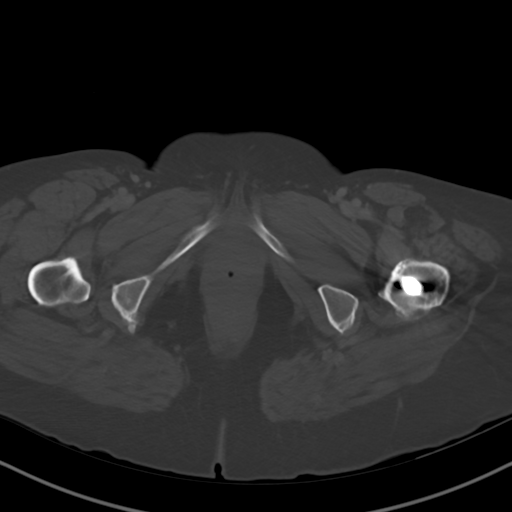
[im 14/89  soft-tissue]
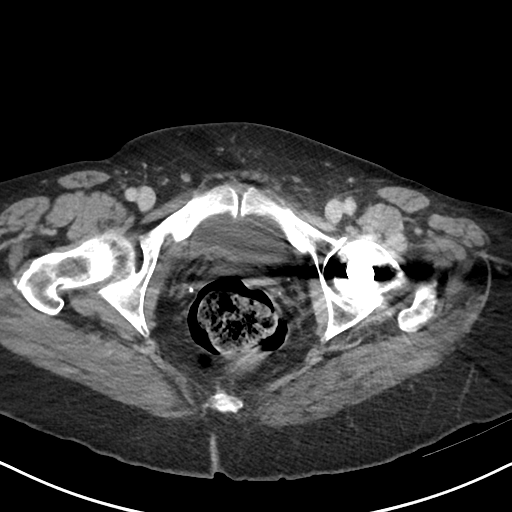
[im 21/89  soft-tissue]
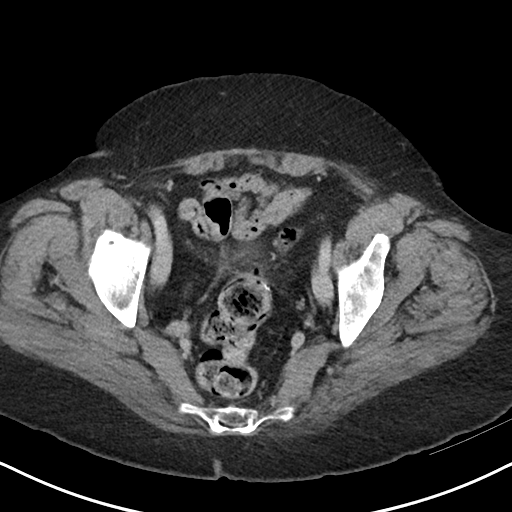
[im 28/89  soft-tissue]
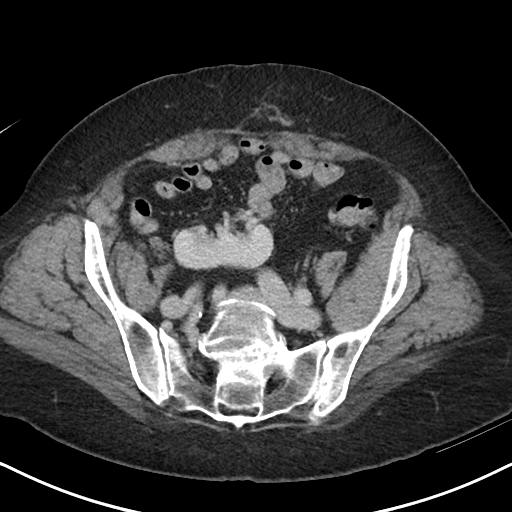
[im 34/89  soft-tissue]
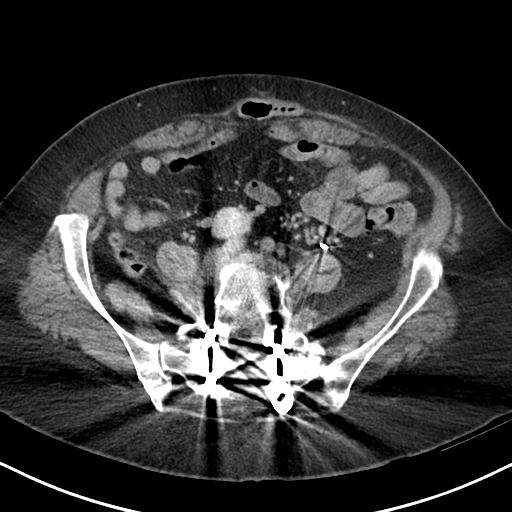
[im 41/89  soft-tissue]
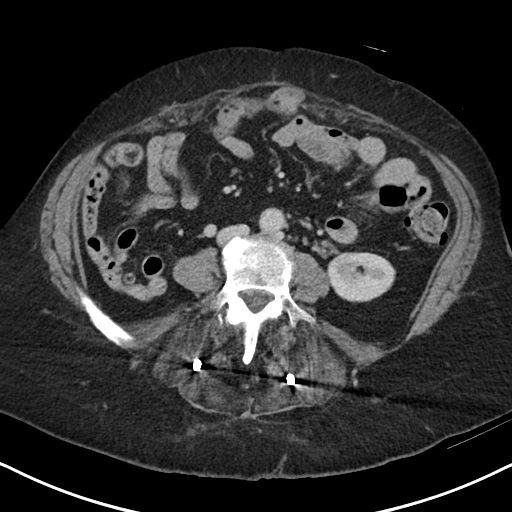
[im 48/89  soft-tissue]
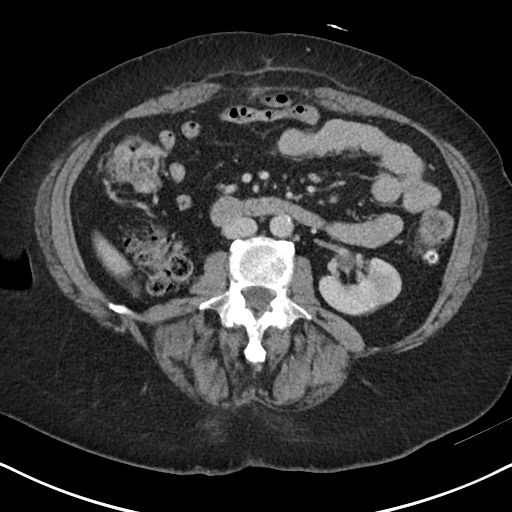
[im 55/89  soft-tissue]
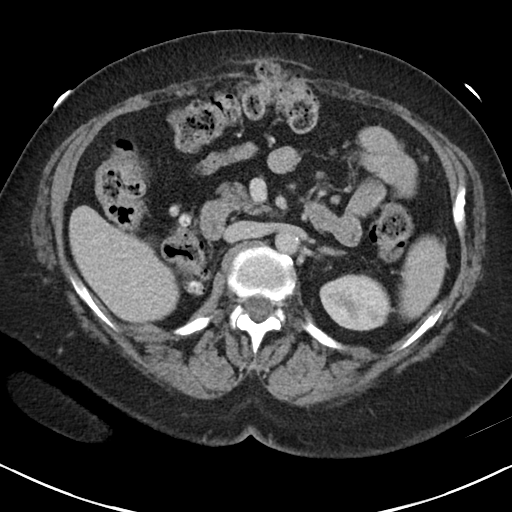
[im 61/89  soft-tissue]
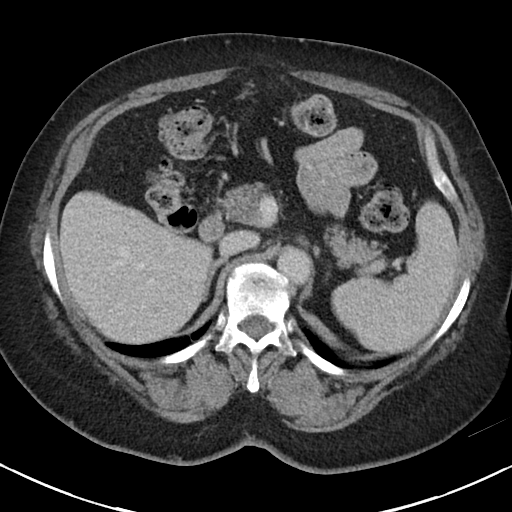
[im 61/89  bone]
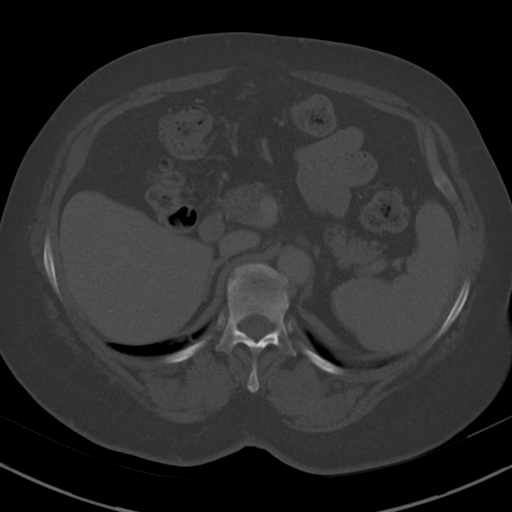
[im 68/89  soft-tissue]
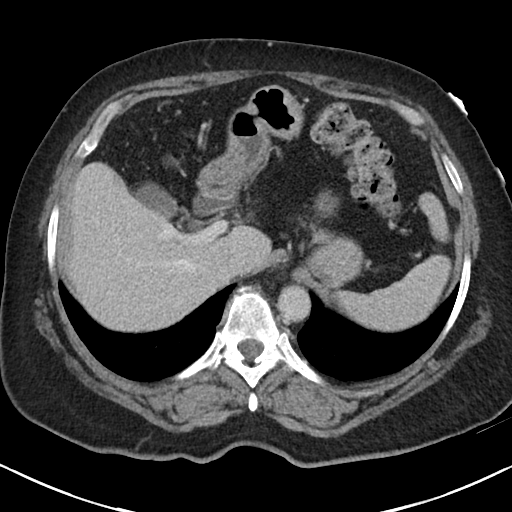
[im 75/89  soft-tissue]
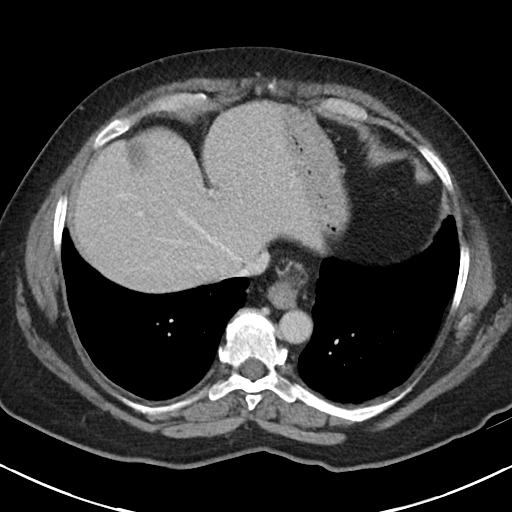
[im 82/89  soft-tissue]
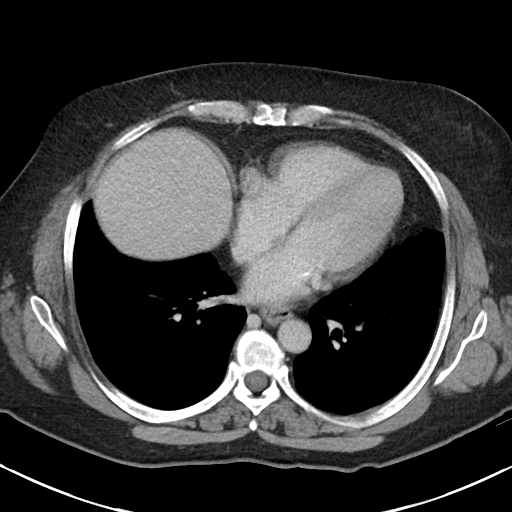

[Series 6: abdomen 3.0 mpr cor · coronal · 0.65mm/px · 3 of 91 slices shown]
[im 31/91  soft-tissue]
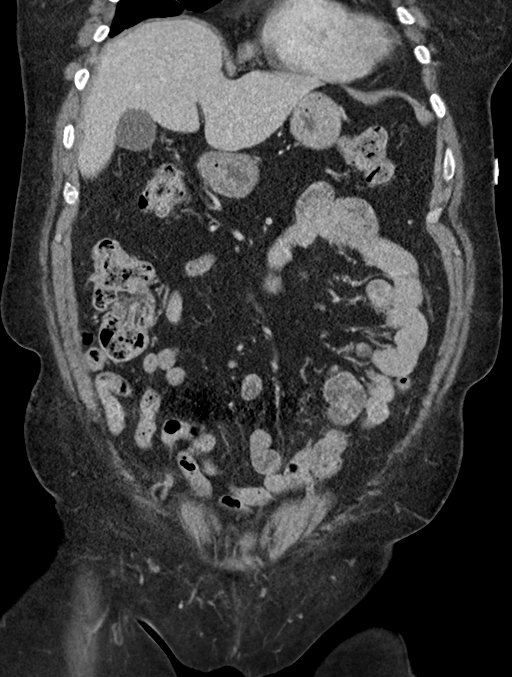
[im 41/91  soft-tissue]
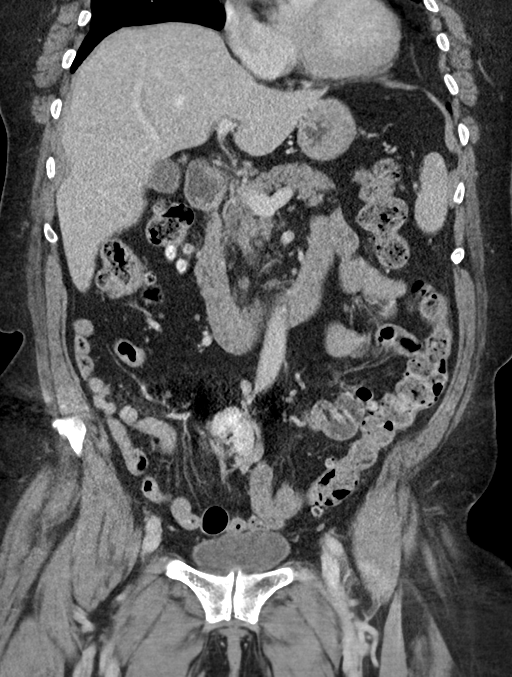
[im 51/91  soft-tissue]
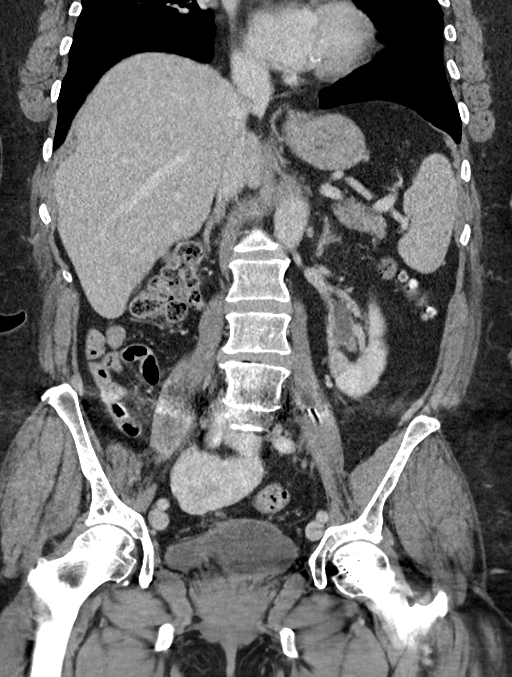

[15 of 46 positions shown; findings below may reference images not displayed]

FINDINGS: Lower chest: Top-normal size heart without pericardial effusion.
Chronic scarring in the right middle lobe with bronchiectasis,
stable in appearance. No effusion or pneumothorax. Small hiatal
hernia.

Hepatobiliary: Unremarkable gallbladder. Homogeneous attenuation of
the liver without space-occupying mass. No biliary dilatation.

Pancreas: No ductal dilatation, mass nor inflammation of the
pancreas.

Spleen: Normal

Adrenals/Urinary Tract: Normal bilateral adrenal glands. Pelvic
right kidney with malrotation. The pelvis of the pelvic kidney faces
ventral. No obstructive uropathy. Normal left kidney position and
appearance. Nondistended urinary bladder without focal mural
thickening or calculus.

Stomach/Bowel: Nondistended stomach. Normal small bowel rotation and
ligament of Treitz position. No bowel obstruction or inflammation.
There is colonic diverticulosis without acute diverticulitis.
Surgical sutures about the distal sigmoid colon. History of
appendectomy.

Vascular/Lymphatic: No significant vascular findings are present. No
enlarged abdominal or pelvic lymph nodes.

Reproductive: Status post hysterectomy. No adnexal masses.

Other: Prior ventral hernia mesh repair with redemonstration of
fascial defects along the upper and lower margins of the mesh
repair. These contain short segments of colon in the upper margin of
the mesh repair in small bowel adjacent to the lower margin. No
bowel obstruction is seen.

Musculoskeletal: No worrisome lytic or sclerotic lesions. Posterior
lumbar fusion hardware the thoracolumbar junction with grade 2
anterolisthesis of L5 on S1.
IMPRESSION: 1. Ventral hernia mesh repair with omental fat and bowel herniations
along the cephalad and caudal margins of the repair as above
described. No bowel obstruction or inflammation.
2. Colonic diverticulosis without acute diverticulitis.
3. Chronic scarring in the right middle lobe with bronchiectasis,
stable in appearance.
4. Pelvic right kidney. No nephrolithiasis nor hydroureteronephrosis
bilaterally.
5. Lumbar fusion across the L5-S1 interspace with grade 2
spondylolisthesis of L5 on S1.

## 2018-10-19 ENCOUNTER — Other Ambulatory Visit: Payer: Self-pay | Admitting: Family Medicine

## 2018-10-23 DIAGNOSIS — Z1231 Encounter for screening mammogram for malignant neoplasm of breast: Secondary | ICD-10-CM | POA: Diagnosis not present

## 2018-10-28 ENCOUNTER — Other Ambulatory Visit: Payer: Self-pay | Admitting: Family Medicine

## 2018-10-28 DIAGNOSIS — N6320 Unspecified lump in the left breast, unspecified quadrant: Secondary | ICD-10-CM

## 2018-10-28 NOTE — Progress Notes (Unsigned)
Patient had a mammogram from [facility] that shows a new left breast mass recommended ultrasound and tomography, have ordered these for the patient. Caryl Pina, MD [facility] 10/28/2018, 7:42 AM

## 2018-11-06 DIAGNOSIS — R922 Inconclusive mammogram: Secondary | ICD-10-CM | POA: Diagnosis not present

## 2018-11-08 ENCOUNTER — Other Ambulatory Visit: Payer: Self-pay | Admitting: Family Medicine

## 2018-11-16 ENCOUNTER — Other Ambulatory Visit: Payer: Self-pay | Admitting: Family Medicine

## 2018-12-11 ENCOUNTER — Other Ambulatory Visit: Payer: Self-pay

## 2018-12-11 ENCOUNTER — Encounter: Payer: Self-pay | Admitting: Family Medicine

## 2018-12-11 ENCOUNTER — Ambulatory Visit (INDEPENDENT_AMBULATORY_CARE_PROVIDER_SITE_OTHER): Payer: Medicare Other | Admitting: Family Medicine

## 2018-12-11 VITALS — BP 117/71 | HR 81 | Temp 97.1°F | Ht 60.0 in | Wt 194.0 lb

## 2018-12-11 DIAGNOSIS — E559 Vitamin D deficiency, unspecified: Secondary | ICD-10-CM

## 2018-12-11 DIAGNOSIS — R52 Pain, unspecified: Secondary | ICD-10-CM

## 2018-12-11 DIAGNOSIS — H6591 Unspecified nonsuppurative otitis media, right ear: Secondary | ICD-10-CM

## 2018-12-11 DIAGNOSIS — M47817 Spondylosis without myelopathy or radiculopathy, lumbosacral region: Secondary | ICD-10-CM

## 2018-12-11 DIAGNOSIS — K219 Gastro-esophageal reflux disease without esophagitis: Secondary | ICD-10-CM | POA: Diagnosis not present

## 2018-12-11 DIAGNOSIS — G8921 Chronic pain due to trauma: Secondary | ICD-10-CM | POA: Insufficient documentation

## 2018-12-11 DIAGNOSIS — E7800 Pure hypercholesterolemia, unspecified: Secondary | ICD-10-CM | POA: Diagnosis not present

## 2018-12-11 DIAGNOSIS — E034 Atrophy of thyroid (acquired): Secondary | ICD-10-CM

## 2018-12-11 DIAGNOSIS — R42 Dizziness and giddiness: Secondary | ICD-10-CM

## 2018-12-11 DIAGNOSIS — I10 Essential (primary) hypertension: Secondary | ICD-10-CM | POA: Diagnosis not present

## 2018-12-11 DIAGNOSIS — N393 Stress incontinence (female) (male): Secondary | ICD-10-CM

## 2018-12-11 DIAGNOSIS — Z79899 Other long term (current) drug therapy: Secondary | ICD-10-CM | POA: Diagnosis not present

## 2018-12-11 MED ORDER — LORATADINE 10 MG PO TABS: 10.0000 mg | ORAL_TABLET | Freq: Every day | ORAL | 11 refills | Status: DC

## 2018-12-11 MED ORDER — TRIAMTERENE-HCTZ 37.5-25 MG PO CAPS: 1.0000 | ORAL_CAPSULE | Freq: Every day | ORAL | 1 refills | Status: DC

## 2018-12-11 MED ORDER — MECLIZINE HCL 25 MG PO TABS: 25.0000 mg | ORAL_TABLET | Freq: Three times a day (TID) | ORAL | 2 refills | Status: AC | PRN

## 2018-12-11 MED ORDER — VITAMIN D (ERGOCALCIFEROL) 1.25 MG (50000 UNIT) PO CAPS: ORAL_CAPSULE | ORAL | 3 refills | Status: DC

## 2018-12-11 MED ORDER — HYDROCODONE-ACETAMINOPHEN 5-325 MG PO TABS: 1.0000 | ORAL_TABLET | Freq: Every day | ORAL | 0 refills | Status: AC | PRN

## 2018-12-11 MED ORDER — METOPROLOL TARTRATE 50 MG PO TABS: 25.0000 mg | ORAL_TABLET | Freq: Every day | ORAL | 1 refills | Status: DC

## 2018-12-11 MED ORDER — ESOMEPRAZOLE MAGNESIUM 40 MG PO CPDR: 40.0000 mg | DELAYED_RELEASE_CAPSULE | Freq: Two times a day (BID) | ORAL | 3 refills | Status: AC

## 2018-12-11 MED ORDER — PREDNISONE 20 MG PO TABS: ORAL_TABLET | ORAL | 0 refills | Status: DC

## 2018-12-11 MED ORDER — CEPHALEXIN 500 MG PO CAPS: 500.0000 mg | ORAL_CAPSULE | Freq: Two times a day (BID) | ORAL | 0 refills | Status: AC

## 2018-12-11 MED ORDER — OXYBUTYNIN CHLORIDE ER 10 MG PO TB24: 10.0000 mg | ORAL_TABLET | Freq: Every day | ORAL | 1 refills | Status: AC

## 2018-12-11 MED ORDER — AZITHROMYCIN 250 MG PO TABS: ORAL_TABLET | ORAL | 0 refills | Status: DC

## 2018-12-11 NOTE — Patient Instructions (Signed)
It was a pleasure seeing you today, Selena Hunt.  Information regarding what we discussed is included in this packet.  Please make an appointment to see me in 3 months.   In a few days you may receive a survey in the mail or online from [employer] regarding your visit with [place] today. Please take a moment to fill this out. Your feedback is very important to our office. It can help [place] better understand your needs as well as improve your experience and satisfaction. Thank you for taking your time to complete it. We care about you.  Because of recent events of COVID-19 ("Coronavirus"), please follow CDC recommendations:   1. Wash your hand frequently 2. Avoid touching your face 3. Stay away from people who are sick 4. If you have symptoms such as fever, cough, shortness of breath then call your healthcare provider for further guidance 5. If you are sick, STAY AT HOME, unless otherwise directed by your healthcare provider. 6. Follow directions from state and national officials regarding staying safe    Please feel free to call our office if any questions or concerns arise.  Warm Regards, Monia Pouch, FNP-C [facility] [address] [place], [place] [address] [phone]

## 2018-12-11 NOTE — Progress Notes (Signed)
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 sooner if needed.   -     CBC with Differential/Platelet -     esomeprazole (NEXIUM) 40 MG capsule; Take 1 capsule (40 mg total) by mouth 2 (two) times daily before a meal.  Morbid obesity ([place]) Diet and exercise encouraged. Weight management discussed.   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 Medications as needed for moderate to severe pain. Report any new or worsening symptoms. -     ToxASSURE Select 13 (MW), Urine -     HYDROcodone-acetaminophen (NORCO/VICODIN) 5-325 MG tablet; Take 1 tablet by mouth daily as needed for moderate pain. -      HYDROcodone-acetaminophen (NORCO/VICODIN) 5-325 MG tablet; Take 1 tablet by mouth daily as needed for moderate pain.  Vertigo Intermittent, no often. No red flags concerning for acute neurovascular event. Would like PRN antivert refilled. Report any new, worsening, or persistent symptoms.  -     meclizine (ANTIVERT) 25 MG tablet; Take 1 tablet (25 mg total) by mouth every 8 (eight) hours as needed for dizziness.  Right otitis media with effusion Middle ear effusion without erythema, pain, or perforation. Continue Flonase. Will add antihistamine and a burst of steroids. Report any new or worsening symptoms.  -  predniSONE (DELTASONE) 20 MG tablet; 2 po at sametime daily for 5 days -     loratadine (CLARITIN) 10 MG tablet; Take 1 tablet (10 mg total) by mouth daily.  Female stress incontinence -     oxybutynin (DITROPAN-XL) 10 MG 24 hr tablet; Take 1 tablet (10 mg total) by mouth at bedtime.   Continue all other maintenance medications.  Follow up plan: Return in about 3 months (around 03/12/2019), or if symptoms worsen or fail to improve.  Continue healthy lifestyle choices, including diet (rich in fruits, vegetables, and lean proteins, and low in salt and simple carbohydrates) and exercise (at least 30 minutes of moderate physical activity daily).  Educational handout given for survey, COVID-19  The above assessment and management plan was discussed with the patient. The patient verbalized understanding of and has agreed to the management plan. Patient is aware to call the clinic if symptoms persist or worsen. Patient is aware when to return to the clinic for a follow-up visit. Patient educated on when it is appropriate to go to the emergency department.   Monia Pouch, FNP-C [facility] [phone]

## 2018-12-12 DIAGNOSIS — R42 Dizziness and giddiness: Secondary | ICD-10-CM

## 2018-12-12 HISTORY — DX: Dizziness and giddiness: R42

## 2018-12-12 LAB — CBC WITH DIFFERENTIAL/PLATELET
Basophils Absolute: 0 10*3/uL (ref 0.0–0.2)
Basos: 0 %
EOS (ABSOLUTE): 0.3 10*3/uL (ref 0.0–0.4)
Eos: 6 %
Hematocrit: 44.4 % (ref 34.0–46.6)
Hemoglobin: 15 g/dL (ref 11.1–15.9)
Immature Grans (Abs): 0 10*3/uL (ref 0.0–0.1)
Immature Granulocytes: 0 %
Lymphocytes Absolute: 1.5 10*3/uL (ref 0.7–3.1)
Lymphs: 27 %
MCH: 28.9 pg (ref 26.6–33.0)
MCHC: 33.8 g/dL (ref 31.5–35.7)
MCV: 86 fL (ref 79–97)
Monocytes Absolute: 0.4 10*3/uL (ref 0.1–0.9)
Monocytes: 8 %
Neutrophils Absolute: 3.3 10*3/uL (ref 1.4–7.0)
Neutrophils: 59 %
Platelets: 208 10*3/uL (ref 150–450)
RBC: 5.19 x10E6/uL (ref 3.77–5.28)
RDW: 13.2 % (ref 11.7–15.4)
WBC: 5.6 10*3/uL (ref 3.4–10.8)

## 2018-12-12 LAB — CMP14+EGFR
ALT: 17 IU/L (ref 0–32)
AST: 17 IU/L (ref 0–40)
Albumin/Globulin Ratio: 2 (ref 1.2–2.2)
Albumin: 4.3 g/dL (ref 3.8–4.8)
Alkaline Phosphatase: 80 IU/L (ref 39–117)
BUN/Creatinine Ratio: 17 (ref 12–28)
BUN: 20 mg/dL (ref 8–27)
Bilirubin Total: 0.4 mg/dL (ref 0.0–1.2)
CO2: 27 mmol/L (ref 20–29)
Calcium: 9.9 mg/dL (ref 8.7–10.3)
Chloride: 104 mmol/L (ref 96–106)
Creatinine, Ser: 1.16 mg/dL — ABNORMAL HIGH (ref 0.57–1.00)
GFR calc Af Amer: 56 mL/min/{1.73_m2} — ABNORMAL LOW (ref >59)
GFR calc non Af Amer: 48 mL/min/{1.73_m2} — ABNORMAL LOW (ref >59)
Globulin, Total: 2.1 g/dL (ref 1.5–4.5)
Glucose: 93 mg/dL (ref 65–99)
Potassium: 3.9 mmol/L (ref 3.5–5.2)
Sodium: 142 mmol/L (ref 134–144)
Total Protein: 6.4 g/dL (ref 6.0–8.5)

## 2018-12-12 LAB — LIPID PANEL
Chol/HDL Ratio: 3.4 ratio (ref 0.0–4.4)
Cholesterol, Total: 189 mg/dL (ref 100–199)
HDL: 55 mg/dL (ref >39)
LDL Chol Calc (NIH): 106 mg/dL — ABNORMAL HIGH (ref 0–99)
Triglycerides: 162 mg/dL — ABNORMAL HIGH (ref 0–149)
VLDL Cholesterol Cal: 28 mg/dL (ref 5–40)

## 2018-12-12 LAB — THYROID PANEL WITH TSH
Free Thyroxine Index: 2.1 (ref 1.2–4.9)
T3 Uptake Ratio: 27 % (ref 24–39)
T4, Total: 7.7 ug/dL (ref 4.5–12.0)
TSH: 2.98 u[IU]/mL (ref 0.450–4.500)

## 2018-12-12 LAB — VITAMIN D 25 HYDROXY (VIT D DEFICIENCY, FRACTURES): Vit D, 25-Hydroxy: 47.9 ng/mL (ref 30.0–100.0)

## 2018-12-15 ENCOUNTER — Other Ambulatory Visit: Payer: Self-pay

## 2018-12-15 ENCOUNTER — Other Ambulatory Visit: Payer: Medicare Other

## 2018-12-15 DIAGNOSIS — R52 Pain, unspecified: Secondary | ICD-10-CM | POA: Diagnosis not present

## 2018-12-15 DIAGNOSIS — I10 Essential (primary) hypertension: Secondary | ICD-10-CM | POA: Diagnosis not present

## 2018-12-15 DIAGNOSIS — G8921 Chronic pain due to trauma: Secondary | ICD-10-CM | POA: Diagnosis not present

## 2018-12-15 DIAGNOSIS — Z79899 Other long term (current) drug therapy: Secondary | ICD-10-CM | POA: Diagnosis not present

## 2018-12-15 DIAGNOSIS — N3000 Acute cystitis without hematuria: Secondary | ICD-10-CM

## 2018-12-16 LAB — MICROALBUMIN / CREATININE URINE RATIO
Creatinine, Urine: 54.6 mg/dL
Microalb/Creat Ratio: 13 mg/g creat (ref 0–29)
Microalbumin, Urine: 7 ug/mL

## 2018-12-17 LAB — TOXASSURE SELECT 13 (MW), URINE

## 2019-01-04 ENCOUNTER — Other Ambulatory Visit: Payer: Self-pay | Admitting: Family Medicine

## 2019-02-11 ENCOUNTER — Other Ambulatory Visit: Payer: Self-pay | Admitting: Family Medicine

## 2019-02-11 DIAGNOSIS — E559 Vitamin D deficiency, unspecified: Secondary | ICD-10-CM

## 2019-03-01 ENCOUNTER — Encounter: Payer: Self-pay | Admitting: Family

## 2019-03-01 ENCOUNTER — Ambulatory Visit (INDEPENDENT_AMBULATORY_CARE_PROVIDER_SITE_OTHER): Payer: Medicare Other | Admitting: Family

## 2019-03-01 DIAGNOSIS — R509 Fever, unspecified: Secondary | ICD-10-CM

## 2019-03-01 DIAGNOSIS — Z20828 Contact with and (suspected) exposure to other viral communicable diseases: Secondary | ICD-10-CM | POA: Diagnosis not present

## 2019-03-01 DIAGNOSIS — Z20822 Contact with and (suspected) exposure to covid-19: Secondary | ICD-10-CM

## 2019-03-01 NOTE — Progress Notes (Signed)
   Virtual Visit via telephone Note Due to COVID-19 pandemic this visit was conducted virtually. This visit type was conducted due to national recommendations for restrictions regarding the COVID-19 Pandemic (e.g. social distancing, sheltering in place) in an effort to limit this patient's exposure and mitigate transmission in our community. All issues noted in this document were discussed and addressed.  A physical exam was not performed with this format.  I connected with Selena Hunt on 03/01/19 at 8:45 AM  by telephone and verified that I am speaking with the correct person using two identifiers. Selena Hunt is currently located at home and no one is currently with her during visit. The provider, Evelina Dun, FNP is located in their office at time of visit.  I discussed the limitations, risks, security and privacy concerns of performing an evaluation and management service by telephone and the availability of in person appointments. I also discussed with the patient that there may be a patient responsible charge related to this service. The patient expressed understanding and agreed to proceed.   History and Present Illness:  Pt calls the office today with fever that started yesterday. She states she was around a friend who tested positive for COVID last week.  Fever  This is a new problem. The current episode started yesterday. The problem occurs constantly. Progression since onset: 100.1 F. Associated symptoms include congestion, ear pain and sleepiness. Pertinent negatives include no coughing, headaches, muscle aches, nausea, sore throat, urinary pain or vomiting. She has tried acetaminophen for the symptoms. The treatment provided mild relief.      Review of Systems  Constitutional: Positive for fever.  HENT: Positive for congestion and ear pain. Negative for sore throat.   Respiratory: Negative for cough.   Gastrointestinal: Negative for nausea and vomiting.  Genitourinary:  Negative for dysuria.  Neurological: Negative for headaches.     Observations/Objective: No SOB or distress noted   Assessment and Plan: 1. Exposure to COVID-19 virus  2. Fever, unspecified fever cause  Pt will go get tested today Rest Self isolation discussed Tylenol as needed Call if symptoms worsen or do not improve      I discussed the assessment and treatment plan with the patient. The patient was provided an opportunity to ask questions and all were answered. The patient agreed with the plan and demonstrated an understanding of the instructions.   The patient was advised to call back or seek an in-person evaluation if the symptoms worsen or if the condition fails to improve as anticipated.  The above assessment and management plan was discussed with the patient. The patient verbalized understanding of and has agreed to the management plan. Patient is aware to call the clinic if symptoms persist or worsen. Patient is aware when to return to the clinic for a follow-up visit. Patient educated on when it is appropriate to go to the emergency department.   Time call ended:  9:00 Am  I provided 15 minutes of non-face-to-face time during this encounter.    Evelina Dun, FNP

## 2019-03-09 ENCOUNTER — Telehealth: Payer: Self-pay | Admitting: Family Medicine

## 2019-03-09 MED ORDER — AMOXICILLIN 875 MG PO TABS: 875.0000 mg | ORAL_TABLET | Freq: Two times a day (BID) | ORAL | 0 refills | Status: DC

## 2019-03-09 NOTE — Telephone Encounter (Signed)
Pt aware.

## 2019-03-09 NOTE — Telephone Encounter (Signed)
Patient called to let Alyse Low know that she had a COVID test done last week, as she was told to do on 03/01/19. Pt said her results were negative but was told by Alyse Low that if her test was negative and she was still having issues with her throat, to call back and Alyse Low would prescribe her something. Pt says her throat is still hurting and does see little white bumps on the right side of throat. Says throat is red and swollen, and is still running a fever.

## 2019-03-09 NOTE — Telephone Encounter (Signed)
Amoxicillin 875 mg BID for 7 days prescription sent to pharmacy. New toothbrush in 3 days

## 2019-03-30 ENCOUNTER — Encounter: Payer: Self-pay | Admitting: Family

## 2019-03-30 ENCOUNTER — Ambulatory Visit (INDEPENDENT_AMBULATORY_CARE_PROVIDER_SITE_OTHER): Payer: Medicare Other | Admitting: Family

## 2019-03-30 ENCOUNTER — Other Ambulatory Visit: Payer: Medicare Other

## 2019-03-30 ENCOUNTER — Other Ambulatory Visit: Payer: Self-pay

## 2019-03-30 DIAGNOSIS — U071 COVID-19: Secondary | ICD-10-CM | POA: Diagnosis not present

## 2019-03-30 MED ORDER — DEXAMETHASONE 6 MG PO TABS: 6.0000 mg | ORAL_TABLET | Freq: Every day | ORAL | 0 refills | Status: DC

## 2019-03-30 MED ORDER — BENZONATATE 200 MG PO CAPS: 200.0000 mg | ORAL_CAPSULE | Freq: Three times a day (TID) | ORAL | 1 refills | Status: DC | PRN

## 2019-03-30 MED ORDER — ALBUTEROL SULFATE HFA 108 (90 BASE) MCG/ACT IN AERS: 2.0000 | INHALATION_SPRAY | Freq: Four times a day (QID) | RESPIRATORY_TRACT | 0 refills | Status: DC | PRN

## 2019-03-30 NOTE — Progress Notes (Signed)
Virtual Visit via telephone Note Due to COVID-19 pandemic this visit was conducted virtually. This visit type was conducted due to national recommendations for restrictions regarding the COVID-19 Pandemic (e.g. social distancing, sheltering in place) in an effort to limit this patient's exposure and mitigate transmission in our community. All issues noted in this document were discussed and addressed.  A physical exam was not performed with this format.  I connected with Selena Hunt on 03/30/19 at 10:53 AM by telephone and verified that I am speaking with the correct person using two identifiers. Selena Hunt is currently located at home and no one is currently with her during visit. The provider, Evelina Dun, FNP is located in their office at time of visit.  I discussed the limitations, risks, security and privacy concerns of performing an evaluation and management service by telephone and the availability of in person appointments. I also discussed with the patient that there may be a patient responsible charge related to this service. The patient expressed understanding and agreed to proceed.   History and Present Illness:  Pt calls the office today with COVID and joint pain. She states she was diagnosed with COVID yesterday. She reports she is taking tylenol and mucinex with moderate relief. Cough This is a new problem. The current episode started in the past 7 days. The problem has been waxing and waning. The problem occurs every few minutes. The cough is productive of brown sputum. Associated symptoms include chills (improved), myalgias, nasal congestion, postnasal drip and a sore throat. Pertinent negatives include no ear congestion, ear pain or shortness of breath. Associated symptoms comments: diarrhea. She has tried rest and OTC cough suppressant for the symptoms. The treatment provided mild relief.      Review of Systems  Constitutional: Positive for chills (improved).  HENT:  Positive for postnasal drip and sore throat. Negative for ear pain.   Respiratory: Positive for cough. Negative for shortness of breath.   Musculoskeletal: Positive for myalgias.     Observations/Objective: No SOB or distress noted  Assessment and Plan: Selena Hunt comes in today with chief complaint of No chief complaint on file.   Diagnosis and orders addressed:  1. COVID-19 virus detected Rest Force fluids BRAT diet, imodium as needed Tylenol as needed Call if symptoms worsen or do not improve  - benzonatate (TESSALON) 200 MG capsule; Take 1 capsule (200 mg total) by mouth 3 (three) times daily as needed.  Dispense: 30 capsule; Refill: 1 - albuterol (VENTOLIN HFA) 108 (90 Base) MCG/ACT inhaler; Inhale 2 puffs into the lungs every 6 (six) hours as needed for wheezing or shortness of breath.  Dispense: 8 g; Refill: 0 - dexamethasone (DECADRON) 6 MG tablet; Take 1 tablet (6 mg total) by mouth daily.  Dispense: 5 tablet; Refill: 0 - MyChart COVID-19 home monitoring program; Future      I discussed the assessment and treatment plan with the patient. The patient was provided an opportunity to ask questions and all were answered. The patient agreed with the plan and demonstrated an understanding of the instructions.   The patient was advised to call back or seek an in-person evaluation if the symptoms worsen or if the condition fails to improve as anticipated.  The above assessment and management plan was discussed with the patient. The patient verbalized understanding of and has agreed to the management plan. Patient is aware to call the clinic if symptoms persist or worsen. Patient is aware when to return to  the clinic for a follow-up visit. Patient educated on when it is appropriate to go to the emergency department.   Time call ended:  11:10 AM  I provided 17 minutes of non-face-to-face time during this encounter.    Evelina Dun, FNP

## 2019-03-31 ENCOUNTER — Other Ambulatory Visit: Payer: Self-pay

## 2019-03-31 ENCOUNTER — Emergency Department (HOSPITAL_COMMUNITY): Payer: Medicare Other

## 2019-03-31 ENCOUNTER — Encounter (HOSPITAL_COMMUNITY): Payer: Self-pay | Admitting: Emergency Medicine

## 2019-03-31 ENCOUNTER — Inpatient Hospital Stay (HOSPITAL_COMMUNITY)
Admission: EM | Admit: 2019-03-31 | Discharge: 2019-04-04 | DRG: 177 | Disposition: A | Discharge status: Home / Self Care | Payer: Medicare Other | Attending: Internal Medicine | Admitting: Internal Medicine

## 2019-03-31 DIAGNOSIS — Z8249 Family history of ischemic heart disease and other diseases of the circulatory system: Secondary | ICD-10-CM | POA: Diagnosis not present

## 2019-03-31 DIAGNOSIS — U071 COVID-19: Principal | ICD-10-CM | POA: Diagnosis present

## 2019-03-31 DIAGNOSIS — I10 Essential (primary) hypertension: Secondary | ICD-10-CM | POA: Diagnosis present

## 2019-03-31 DIAGNOSIS — E785 Hyperlipidemia, unspecified: Secondary | ICD-10-CM | POA: Diagnosis present

## 2019-03-31 DIAGNOSIS — J1282 Pneumonia due to coronavirus disease 2019: Secondary | ICD-10-CM | POA: Diagnosis present

## 2019-03-31 DIAGNOSIS — G8921 Chronic pain due to trauma: Secondary | ICD-10-CM | POA: Diagnosis present

## 2019-03-31 DIAGNOSIS — R0902 Hypoxemia: Secondary | ICD-10-CM

## 2019-03-31 DIAGNOSIS — H8109 Meniere's disease, unspecified ear: Secondary | ICD-10-CM | POA: Diagnosis present

## 2019-03-31 DIAGNOSIS — Z885 Allergy status to narcotic agent status: Secondary | ICD-10-CM

## 2019-03-31 DIAGNOSIS — Z981 Arthrodesis status: Secondary | ICD-10-CM | POA: Diagnosis not present

## 2019-03-31 DIAGNOSIS — R0602 Shortness of breath: Secondary | ICD-10-CM

## 2019-03-31 DIAGNOSIS — E7800 Pure hypercholesterolemia, unspecified: Secondary | ICD-10-CM | POA: Diagnosis not present

## 2019-03-31 DIAGNOSIS — Z86711 Personal history of pulmonary embolism: Secondary | ICD-10-CM | POA: Diagnosis not present

## 2019-03-31 DIAGNOSIS — K219 Gastro-esophageal reflux disease without esophagitis: Secondary | ICD-10-CM | POA: Diagnosis present

## 2019-03-31 DIAGNOSIS — Z87442 Personal history of urinary calculi: Secondary | ICD-10-CM

## 2019-03-31 DIAGNOSIS — N179 Acute kidney failure, unspecified: Secondary | ICD-10-CM | POA: Diagnosis present

## 2019-03-31 DIAGNOSIS — J9601 Acute respiratory failure with hypoxia: Secondary | ICD-10-CM | POA: Diagnosis present

## 2019-03-31 DIAGNOSIS — E876 Hypokalemia: Secondary | ICD-10-CM | POA: Diagnosis present

## 2019-03-31 DIAGNOSIS — Z7952 Long term (current) use of systemic steroids: Secondary | ICD-10-CM

## 2019-03-31 DIAGNOSIS — Z886 Allergy status to analgesic agent status: Secondary | ICD-10-CM

## 2019-03-31 DIAGNOSIS — Z91040 Latex allergy status: Secondary | ICD-10-CM | POA: Diagnosis not present

## 2019-03-31 DIAGNOSIS — Z888 Allergy status to other drugs, medicaments and biological substances status: Secondary | ICD-10-CM | POA: Diagnosis not present

## 2019-03-31 DIAGNOSIS — E860 Dehydration: Secondary | ICD-10-CM | POA: Diagnosis present

## 2019-03-31 DIAGNOSIS — J1289 Other viral pneumonia: Secondary | ICD-10-CM | POA: Diagnosis present

## 2019-03-31 DIAGNOSIS — N393 Stress incontinence (female) (male): Secondary | ICD-10-CM | POA: Diagnosis present

## 2019-03-31 DIAGNOSIS — E034 Atrophy of thyroid (acquired): Secondary | ICD-10-CM | POA: Diagnosis present

## 2019-03-31 DIAGNOSIS — Z87820 Personal history of traumatic brain injury: Secondary | ICD-10-CM

## 2019-03-31 DIAGNOSIS — Z90710 Acquired absence of both cervix and uterus: Secondary | ICD-10-CM

## 2019-03-31 DIAGNOSIS — Z86010 Personal history of colonic polyps: Secondary | ICD-10-CM

## 2019-03-31 DIAGNOSIS — Z79890 Hormone replacement therapy (postmenopausal): Secondary | ICD-10-CM

## 2019-03-31 DIAGNOSIS — J9600 Acute respiratory failure, unspecified whether with hypoxia or hypercapnia: Secondary | ICD-10-CM

## 2019-03-31 DIAGNOSIS — Z79899 Other long term (current) drug therapy: Secondary | ICD-10-CM

## 2019-03-31 LAB — PROCALCITONIN: Procalcitonin: <0.1 ng/mL

## 2019-03-31 LAB — CBC WITH DIFFERENTIAL/PLATELET
Abs Immature Granulocytes: 0.01 10*3/uL (ref 0.00–0.07)
Basophils Absolute: 0 10*3/uL (ref 0.0–0.1)
Basophils Relative: 0 %
Eosinophils Absolute: 0 10*3/uL (ref 0.0–0.5)
Eosinophils Relative: 0 %
HCT: 45.2 % (ref 36.0–46.0)
Hemoglobin: 14.9 g/dL (ref 12.0–15.0)
Immature Granulocytes: 0 %
Lymphocytes Relative: 7 %
Lymphs Abs: 0.4 10*3/uL — ABNORMAL LOW (ref 0.7–4.0)
MCH: 29.2 pg (ref 26.0–34.0)
MCHC: 33 g/dL (ref 30.0–36.0)
MCV: 88.6 fL (ref 80.0–100.0)
Monocytes Absolute: 0.3 10*3/uL (ref 0.1–1.0)
Monocytes Relative: 5 %
Neutro Abs: 5.2 10*3/uL (ref 1.7–7.7)
Neutrophils Relative %: 88 %
Platelets: 118 10*3/uL — ABNORMAL LOW (ref 150–400)
RBC: 5.1 MIL/uL (ref 3.87–5.11)
RDW: 13 % (ref 11.5–15.5)
WBC: 5.9 10*3/uL (ref 4.0–10.5)
nRBC: 0 % (ref 0.0–0.2)

## 2019-03-31 LAB — COMPREHENSIVE METABOLIC PANEL
ALT: 28 U/L (ref 0–44)
AST: 42 U/L — ABNORMAL HIGH (ref 15–41)
Albumin: 3.8 g/dL (ref 3.5–5.0)
Alkaline Phosphatase: 71 U/L (ref 38–126)
Anion gap: 12 (ref 5–15)
BUN: 26 mg/dL — ABNORMAL HIGH (ref 8–23)
CO2: 25 mmol/L (ref 22–32)
Calcium: 9.1 mg/dL (ref 8.9–10.3)
Chloride: 103 mmol/L (ref 98–111)
Creatinine, Ser: 1.16 mg/dL — ABNORMAL HIGH (ref 0.44–1.00)
GFR calc Af Amer: 55 mL/min — ABNORMAL LOW (ref >60)
GFR calc non Af Amer: 48 mL/min — ABNORMAL LOW (ref >60)
Glucose, Bld: 132 mg/dL — ABNORMAL HIGH (ref 70–99)
Potassium: 3.3 mmol/L — ABNORMAL LOW (ref 3.5–5.1)
Sodium: 140 mmol/L (ref 135–145)
Total Bilirubin: 0.7 mg/dL (ref 0.3–1.2)
Total Protein: 7.1 g/dL (ref 6.5–8.1)

## 2019-03-31 LAB — TROPONIN I (HIGH SENSITIVITY): Troponin I (High Sensitivity): 6 ng/L (ref <18)

## 2019-03-31 LAB — C-REACTIVE PROTEIN: CRP: 7.3 mg/dL — ABNORMAL HIGH (ref <1.0)

## 2019-03-31 LAB — ABO/RH: ABO/RH(D): O POS

## 2019-03-31 LAB — FERRITIN: Ferritin: 854 ng/mL — ABNORMAL HIGH (ref 11–307)

## 2019-03-31 LAB — HIV ANTIBODY (ROUTINE TESTING W REFLEX): HIV Screen 4th Generation wRfx: NONREACTIVE

## 2019-03-31 LAB — D-DIMER, QUANTITATIVE: D-Dimer, Quant: 0.44 ug/mL-FEU (ref 0.00–0.50)

## 2019-03-31 LAB — LACTIC ACID, PLASMA
Lactic Acid, Venous: 2 mmol/L (ref 0.5–1.9)
Lactic Acid, Venous: 0.9 mmol/L (ref 0.5–1.9)

## 2019-03-31 LAB — LACTATE DEHYDROGENASE: LDH: 273 U/L — ABNORMAL HIGH (ref 98–192)

## 2019-03-31 LAB — BRAIN NATRIURETIC PEPTIDE: B Natriuretic Peptide: 70 pg/mL (ref 0.0–100.0)

## 2019-03-31 LAB — FIBRINOGEN: Fibrinogen: 726 mg/dL — ABNORMAL HIGH (ref 210–475)

## 2019-03-31 LAB — TRIGLYCERIDES: Triglycerides: 110 mg/dL (ref <150)

## 2019-03-31 MED ORDER — POTASSIUM CHLORIDE CRYS ER 20 MEQ PO TBCR
40.0000 meq | EXTENDED_RELEASE_TABLET | Freq: Two times a day (BID) | ORAL | Status: AC
  Administered 2019-03-31 – 2019-04-01 (×2): 40 meq via ORAL
  Filled 2019-03-31 (×2): qty 2

## 2019-03-31 MED ORDER — METOPROLOL TARTRATE 25 MG PO TABS
25.0000 mg | ORAL_TABLET | Freq: Every day | ORAL | Status: DC
  Administered 2019-03-31 – 2019-04-04 (×5): 25 mg via ORAL
  Filled 2019-03-31 (×5): qty 1

## 2019-03-31 MED ORDER — SODIUM CHLORIDE 0.9 % IV SOLN: INTRAVENOUS | Status: DC

## 2019-03-31 MED ORDER — ALBUTEROL SULFATE HFA 108 (90 BASE) MCG/ACT IN AERS
2.0000 | INHALATION_SPRAY | Freq: Four times a day (QID) | RESPIRATORY_TRACT | Status: DC | PRN
  Administered 2019-04-03 – 2019-04-04 (×2): 2 via RESPIRATORY_TRACT
  Filled 2019-03-31 (×2): qty 6.7

## 2019-03-31 MED ORDER — PRAVASTATIN SODIUM 10 MG PO TABS
20.0000 mg | ORAL_TABLET | Freq: Every day | ORAL | Status: DC
  Administered 2019-03-31 – 2019-04-02 (×4): 20 mg via ORAL
  Administered 2019-04-03: 40 mg via ORAL
  Administered 2019-04-04: 18:00:00 20 mg via ORAL
  Filled 2019-03-31 (×2): qty 1
  Filled 2019-03-31 (×4): qty 2

## 2019-03-31 MED ORDER — DOCUSATE SODIUM 100 MG PO CAPS
100.0000 mg | ORAL_CAPSULE | Freq: Two times a day (BID) | ORAL | Status: DC | PRN
  Administered 2019-04-02: 100 mg via ORAL
  Filled 2019-03-31: qty 1

## 2019-03-31 MED ORDER — TRAZODONE HCL 50 MG PO TABS
25.0000 mg | ORAL_TABLET | Freq: Every evening | ORAL | Status: DC | PRN
  Filled 2019-03-31 (×2): qty 1

## 2019-03-31 MED ORDER — LEVOTHYROXINE SODIUM 100 MCG PO TABS
100.0000 ug | ORAL_TABLET | Freq: Every day | ORAL | Status: DC
  Administered 2019-04-01 – 2019-04-04 (×4): 100 ug via ORAL
  Filled 2019-03-31 (×5): qty 1

## 2019-03-31 MED ORDER — BENZONATATE 100 MG PO CAPS
200.0000 mg | ORAL_CAPSULE | Freq: Three times a day (TID) | ORAL | Status: DC | PRN
  Administered 2019-04-02 – 2019-04-03 (×2): 200 mg via ORAL
  Filled 2019-03-31 (×2): qty 2

## 2019-03-31 MED ORDER — PANTOPRAZOLE SODIUM 40 MG PO TBEC
40.0000 mg | DELAYED_RELEASE_TABLET | Freq: Two times a day (BID) | ORAL | Status: DC
  Administered 2019-03-31 – 2019-04-04 (×8): 40 mg via ORAL
  Filled 2019-03-31 (×8): qty 1

## 2019-03-31 MED ORDER — SODIUM CHLORIDE 0.9 % IV SOLN: 200.0000 mg | Freq: Once | INTRAVENOUS | Status: DC

## 2019-03-31 MED ORDER — FLUTICASONE PROPIONATE 50 MCG/ACT NA SUSP
2.0000 | Freq: Every day | NASAL | Status: DC
  Administered 2019-04-01 – 2019-04-04 (×4): 2 via NASAL
  Filled 2019-03-31 (×2): qty 16

## 2019-03-31 MED ORDER — SODIUM CHLORIDE 0.9 % IV SOLN
100.0000 mg | Freq: Every day | INTRAVENOUS | Status: AC
  Administered 2019-04-01 – 2019-04-04 (×4): 100 mg via INTRAVENOUS
  Filled 2019-03-31 (×3): qty 20
  Filled 2019-03-31: qty 100

## 2019-03-31 MED ORDER — ONDANSETRON HCL 4 MG PO TABS: 4.0000 mg | ORAL_TABLET | Freq: Four times a day (QID) | ORAL | Status: DC | PRN

## 2019-03-31 MED ORDER — DEXAMETHASONE SODIUM PHOSPHATE 10 MG/ML IJ SOLN
6.0000 mg | INTRAMUSCULAR | Status: DC
  Administered 2019-03-31 – 2019-04-04 (×6): 6 mg via INTRAVENOUS
  Filled 2019-03-31 (×6): qty 1

## 2019-03-31 MED ORDER — CALCIUM CARBONATE-VITAMIN D 500-200 MG-UNIT PO TABS
1.0000 | ORAL_TABLET | Freq: Every day | ORAL | Status: DC
  Administered 2019-04-01 – 2019-04-04 (×4): 1 via ORAL
  Filled 2019-03-31 (×6): qty 1

## 2019-03-31 MED ORDER — SODIUM CHLORIDE 0.9 % IV SOLN
200.0000 mg | Freq: Once | INTRAVENOUS | Status: AC
  Administered 2019-03-31: 200 mg via INTRAVENOUS
  Filled 2019-03-31: qty 40

## 2019-03-31 MED ORDER — SODIUM CHLORIDE 0.9 % IV SOLN: 100.0000 mg | Freq: Every day | INTRAVENOUS | Status: DC

## 2019-03-31 MED ORDER — OXYCODONE HCL 5 MG PO TABS
5.0000 mg | ORAL_TABLET | Freq: Four times a day (QID) | ORAL | Status: DC | PRN
  Filled 2019-03-31 (×2): qty 1

## 2019-03-31 MED ORDER — ONDANSETRON HCL 4 MG/2ML IJ SOLN: 4.0000 mg | Freq: Four times a day (QID) | INTRAMUSCULAR | Status: DC | PRN

## 2019-03-31 MED ORDER — LORATADINE 10 MG PO TABS
10.0000 mg | ORAL_TABLET | Freq: Every day | ORAL | Status: DC
  Administered 2019-03-31 – 2019-04-04 (×5): 10 mg via ORAL
  Filled 2019-03-31 (×5): qty 1

## 2019-03-31 MED ORDER — ENOXAPARIN SODIUM 40 MG/0.4ML ~~LOC~~ SOLN
40.0000 mg | SUBCUTANEOUS | Status: DC
  Administered 2019-03-31 – 2019-04-03 (×4): 40 mg via SUBCUTANEOUS
  Filled 2019-03-31 (×5): qty 0.4

## 2019-03-31 MED ORDER — ACETAMINOPHEN 325 MG PO TABS
650.0000 mg | ORAL_TABLET | Freq: Four times a day (QID) | ORAL | Status: DC | PRN
  Administered 2019-04-03: 650 mg via ORAL
  Filled 2019-03-31: qty 2

## 2019-03-31 MED ORDER — VITAMIN D (ERGOCALCIFEROL) 1.25 MG (50000 UNIT) PO CAPS
50000.0000 [IU] | ORAL_CAPSULE | ORAL | Status: DC
  Administered 2019-04-01 – 2019-04-03 (×2): 50000 [IU] via ORAL
  Filled 2019-03-31 (×5): qty 1

## 2019-03-31 NOTE — ED Triage Notes (Signed)
Pt was covid positive on Monday.  C/o of worsening sob, cough, headache and generalized boyd aches. Pt 88% on room air.  Pt was placed on 2 L [place]

## 2019-03-31 NOTE — ED Notes (Signed)
Date and time results received: 03/31/19 10:40 AM  (use smartphrase ".now" to insert current time)  Test: Lactic Acid Critical Value: 2.0  Name of Provider Notified: Sabra Heck  Orders Received? Or Actions Taken?: Orders Received - See Orders for details

## 2019-03-31 NOTE — ED Provider Notes (Addendum)
[facility] EMERGENCY DEPARTMENT Provider Note   CSN: [id number] Arrival date & time: 03/31/19  [id number]     History Chief Complaint  Patient presents with  . covid +    Selena Hunt is a 68 y.o. female.  HPI   This very pleasant 68 year old female with a known history of prior lung injury after major trauma presents with a complaint of increasing shortness of breath and coughing.  This comes on after several days ago being diagnosed with coronavirus.  She was exposed to multiple family members who tested positive.  Her coughing started yesterday, previous to that was myalgias, sensitivity to the skin, 1 episode of vomiting.  She reports that this morning she woke up feeling increasingly short of breath and very dyspneic.  This is persistent, worse with any exertion and not associated with fevers at this time.  She has not had any diarrhea, denies any swelling, denies any blurred vision or sore throat.  The patient had been seen by her family doctor recently, she was called in some steroids and albuterol inhaler yesterday but states that has not helped and when she measured her oxygen on a home pulse oximeter it was 80%.  Paramedics report that the patient was between 88 and 92% on their monitor, supplemental oxygen given.   Past Medical History:  Diagnosis Date  . Benign neoplasm of colon   . Blood transfusion without reported diagnosis    x 34 to date  . Chronic kidney disease   . Colon polyps   . Diverticulitis of colon (without mention of hemorrhage)(562.11)   . Diverticulosis of colon (without mention of hemorrhage)   . Essential hypertension, benign   . Female stress incontinence   . GERD (gastroesophageal reflux disease)   . Hematuria   . Hernia of unspecified site of abdominal cavity without mention of obstruction or gangrene   . History of closed head injury 04/21/2018  . Kidney stones    has had one   . Lumbosacral spondylosis without myelopathy   . Meniere disease    Drs are uncertain of this DX   . Migraine headache   . Neuromuscular disorder ([place])   . Obesity   . Other and unspecified hyperlipidemia   . Pneumonia   . Pulmonary embolism ([place])   . Seizures ([place])    HX of --- from medications,2003  . Stricture and stenosis of esophagus   . Thyroid disease   . Ventral hernia     Patient Active Problem List   Diagnosis Date Noted  . Vertigo 12/12/2018  . Hypothyroidism due to acquired atrophy of thyroid 12/11/2018  . Chronic pain due to trauma 12/11/2018  . Controlled substance agreement signed 12/11/2018  . History of closed head injury 04/21/2018  . Nausea without vomiting 01/21/2017  . Pain management 01/21/2017  . Abdominal pain, epigastric 01/21/2017  . Chronic GERD 01/21/2017  . AP (abdominal pain) 03/22/2015  . Ventral hernia without obstruction or gangrene 03/22/2015  . Vitamin D deficiency 09/13/2013  . Chest pain 09/04/2013  . Metabolic syndrome [id number]  . Migraine headache, history of   . Lumbosacral spondylosis without myelopathy   . Diverticulitis of colon (without mention of hemorrhage)(562.11)   . Morbid obesity ([place])   . Fatigue, chronic   . Female stress incontinence   . Hypertension   . Hematuria   . Hyperlipidemia   . Benign neoplasm of colon   . Stricture and stenosis of esophagus     Past Surgical History:  Procedure Laterality Date  . ABDOMINAL HYSTERECTOMY  1990  . APPENDECTOMY    . [place]   Removed  . FEMORAL VARUS OSTEOTOMY W/ ADDUCTOR RELEASE AND ILIAC CREST BONE GRAFT, PELVIC OSTEOTOMY    . FEMUR FRACTURE SURGERY Left 2003  . HERNIA REPAIR     with mesh, abdominal  . HIP FRACTURE SURGERY Left   . HUMERUS FRACTURE SURGERY Left   . LUMBAR FUSION  1995   Fusion L4 - L5  . PARTIAL COLECTOMY     Secondary to diverticulitis  . TENDON TRANSFER    . TUBAL LIGATION  1972  . ULNAR NERVE REPAIR Left      OB History   No obstetric history on file.     Family History  Problem  Relation Age of Onset  . Alzheimer's disease Mother   . Hypertension Mother   . Stroke Mother   . Alzheimer's disease Father   . Hypertension Father   . Gout Brother   . Fibromyalgia Daughter   . Hypertension Son   . Depression Son   . Spondylitis Son        spondylosis  . GER disease Son   . Heart disease Maternal Grandmother   . Heart disease Maternal Grandfather   . Congestive Heart Failure Maternal Grandfather   . Stroke Paternal Grandmother   . Hypertension Paternal Grandmother   . Depression Paternal Grandfather   . Suicidality Paternal Grandfather   . Colon cancer Neg Hx   . Esophageal cancer Neg Hx   . Pancreatic cancer Neg Hx   . Stomach cancer Neg Hx   . Liver disease Neg Hx     Social History   Tobacco Use  . Smoking status: Never Smoker  . Smokeless tobacco: Never Used  Substance Use Topics  . Alcohol use: Yes    Alcohol/week: 0.0 standard drinks    Comment: wine once or twice a year  . Drug use: No    Home Medications Prior to Admission medications   Medication Sig Start Date End Date Taking? Authorizing Provider  albuterol (VENTOLIN HFA) 108 (90 Base) MCG/ACT inhaler Inhale 2 puffs into the lungs every 6 (six) hours as needed for wheezing or shortness of breath. 03/30/19  Yes Hawks, Christy A, FNP  benzonatate (TESSALON) 200 MG capsule Take 1 capsule (200 mg total) by mouth 3 (three) times daily as needed. 03/30/19  Yes Hawks, Theador Hawthorne, FNP  calcium-vitamin D (OSCAL WITH D) 500-200 MG-UNIT tablet Take 1 tablet by mouth daily with breakfast. 09/14/18  Yes Chipper Herb, MD  dexamethasone (DECADRON) 6 MG tablet Take 1 tablet (6 mg total) by mouth daily. 03/30/19  Yes Hawks, Christy A, FNP  esomeprazole (NEXIUM) 40 MG capsule Take 1 capsule (40 mg total) by mouth 2 (two) times daily before a meal. 12/11/18  Yes Rakes, Connye Burkitt, FNP  fluticasone (FLONASE) 50 MCG/ACT nasal spray Place 2 sprays into both nostrils daily. 05/08/18  Yes Dettinger, Fransisca Kaufmann, MD    loratadine (CLARITIN) 10 MG tablet Take 1 tablet (10 mg total) by mouth daily. 12/11/18  Yes Rakes, Connye Burkitt, FNP  metoprolol tartrate (LOPRESSOR) 50 MG tablet Take 0.5 tablets (25 mg total) by mouth daily. 12/11/18  Yes Rakes, Connye Burkitt, FNP  Multiple Vitamins-Minerals (WOMENS 50+ ADVANCED PO) Take by mouth.   Yes [provider]  pravastatin (PRAVACHOL) 20 MG tablet TAKE 1 TABLET BY MOUTH EVERY DAY 08/10/18  Yes Chipper Herb, MD  SYNTHROID 100  MCG tablet TAKE 1 TABLET BY MOUTH EVERY DAY EXCEPT MONDAY & FRIDAY TAKE 1/2 TABLET 01/04/19  Yes Rakes, Connye Burkitt, FNP  triamterene-hydrochlorothiazide (DYAZIDE) 37.5-25 MG capsule Take 1 each (1 capsule total) by mouth daily. 12/11/18 03/31/19 Yes Rakes, Connye Burkitt, FNP  Vitamin D, Ergocalciferol, (DRISDOL) 1.25 MG (50000 UT) CAPS capsule TAKE 1 CAPSULE BY MOUTH EVERY 7 DAYS 12/11/18  Yes Rakes, Connye Burkitt, FNP  meclizine (ANTIVERT) 25 MG tablet Take 1 tablet (25 mg total) by mouth every 8 (eight) hours as needed for dizziness. Patient not taking: Reported on 03/31/2019 12/11/18   Baruch Gouty, FNP    Allergies    Ambien [zolpidem tartrate], Diovan [valsartan], Wellbutrin [bupropion], Codeine, Feldene [piroxicam], Guaifenesin er, Latex, Mobic [meloxicam], Neurontin [gabapentin], Crestor [rosuvastatin calcium], Lipitor [atorvastatin calcium], Livalo [pitavastatin], Simcor [niacin-simvastatin er], and Zocor [simvastatin]  Review of Systems   Review of Systems  All other systems reviewed and are negative.   Physical Exam Updated Vital Signs BP 136/68   Pulse 91   Temp 99.9 F (37.7 C) (Oral)   Resp (!) 21   Ht 1.549 m (5\' 1" )   Wt 86.2 kg   SpO2 95%   BMI 35.90 kg/m   Physical Exam Vitals and nursing note reviewed.  Constitutional:      General: She is in acute distress.     Appearance: She is well-developed.  HENT:     Head: Normocephalic and atraumatic.     Mouth/Throat:     Pharynx: No oropharyngeal exudate.  Eyes:     General: No scleral  icterus.       Right eye: No discharge.        Left eye: No discharge.     Conjunctiva/sclera: Conjunctivae normal.     Pupils: Pupils are equal, round, and reactive to light.  Neck:     Thyroid: No thyromegaly.     Vascular: No JVD.  Cardiovascular:     Rate and Rhythm: Normal rate and regular rhythm.     Heart sounds: Normal heart sounds. No murmur. No friction rub. No gallop.   Pulmonary:     Effort: Respiratory distress present.     Breath sounds: Rales present. No wheezing.  Abdominal:     General: Bowel sounds are normal. There is no distension.     Palpations: Abdomen is soft. There is no mass.     Tenderness: There is no abdominal tenderness.  Musculoskeletal:        General: No tenderness. Normal range of motion.     Cervical back: Normal range of motion and neck supple.  Lymphadenopathy:     Cervical: No cervical adenopathy.  Skin:    General: Skin is warm and dry.     Findings: No erythema or rash.  Neurological:     Mental Status: She is alert.     Coordination: Coordination normal.  Psychiatric:        Behavior: Behavior normal.     ED Results / Procedures / Treatments   Labs (all labs ordered are listed, but only abnormal results are displayed) Labs Reviewed  LACTIC ACID, PLASMA - Abnormal; Notable for the following components:      Result Value   Lactic Acid, Venous 2.0 (*)    All other components within normal limits  CBC WITH DIFFERENTIAL/PLATELET - Abnormal; Notable for the following components:   Platelets 118 (*)    Lymphs Abs 0.4 (*)    All other components within normal limits  COMPREHENSIVE  METABOLIC PANEL - Abnormal; Notable for the following components:   Potassium 3.3 (*)    Glucose, Bld 132 (*)    BUN 26 (*)    Creatinine, Ser 1.16 (*)    AST 42 (*)    GFR calc non Af Amer 48 (*)    GFR calc Af Amer 55 (*)    All other components within normal limits  LACTATE DEHYDROGENASE - Abnormal; Notable for the following components:   LDH 273  (*)    All other components within normal limits  FERRITIN - Abnormal; Notable for the following components:   Ferritin 854 (*)    All other components within normal limits  FIBRINOGEN - Abnormal; Notable for the following components:   Fibrinogen 726 (*)    All other components within normal limits  C-REACTIVE PROTEIN - Abnormal; Notable for the following components:   CRP 7.3 (*)    All other components within normal limits  CULTURE, BLOOD (ROUTINE X 2)  SARS CORONAVIRUS 2 (TAT 6-24 HRS)  CULTURE, BLOOD (ROUTINE X 2)  D-DIMER, QUANTITATIVE (NOT AT [facility])  PROCALCITONIN  TRIGLYCERIDES  BRAIN NATRIURETIC PEPTIDE  LACTIC ACID, PLASMA  TROPONIN I (HIGH SENSITIVITY)    EKG EKG Interpretation  Date/Time:  Wednesday March 31 2019 10:04:19 EST Ventricular Rate:  101 PR Interval:    QRS Duration: 74 QT Interval:  336 QTC Calculation: 436 R Axis:   -8 Text Interpretation: Sinus tachycardia Anteroseptal infarct, age indeterminate since last tracing no significant change Confirmed by Noemi Chapel [phone]) on 03/31/2019 12:36:49 PM   Radiology DG Chest Port 1 View  Result Date: 03/31/2019 CLINICAL DATA:  Cough, shortness of breath, recent COVID positive test EXAM: PORTABLE CHEST 1 VIEW COMPARISON:  2019 FINDINGS: Low lung volumes reflecting shallow inspiration. Patchy opacities, left greater than right. No significant pleural effusion. No pneumothorax. Cardiomediastinal contours are likely within normal limits for technique. IMPRESSION: Patchy pulmonary opacities, left greater than right, suspicious for COVID 19 pneumonia. Electronically Signed   By: Macy Mis M.D.   On: 03/31/2019 10:26    Procedures .Critical Care Performed by: Noemi Chapel, MD Authorized by: Noemi Chapel, MD   Critical care provider statement:    Critical care time (minutes):  35   Critical care time was exclusive of:  Separately billable procedures and treating other patients and teaching time    Critical care was necessary to treat or prevent imminent or life-threatening deterioration of the following conditions:  Respiratory failure (Acute hypoxic respiratory failure secondary to coronavirus pneumonia)   Critical care was time spent personally by me on the following activities:  Blood draw for specimens, development of treatment plan with patient or surrogate, discussions with consultants, evaluation of patient's response to treatment, examination of patient, obtaining history from patient or surrogate, ordering and performing treatments and interventions, ordering and review of laboratory studies, ordering and review of radiographic studies, pulse oximetry, re-evaluation of patient's condition and review of old charts   (including critical care time)  Medications Ordered in ED Medications - No data to display  ED Course  I have reviewed the triage vital signs and the nursing notes.  Pertinent labs & imaging results that were available during my care of the patient were reviewed by me and considered in my medical decision making (see chart for details).  Clinical Course as of Mar 30 1146  Wed Mar 31, 2019  1028 The x-ray according to my interpretation shows infiltrates likely in the left lung, poor inspiration, mediastinum  appears normal, skin and soft tissues appear normal, possible multifocal infiltrate on the left.  This is consistent with likely coronavirus   [BM]  [id number] I agree with radiologist interpretation of the x-ray, likely Covid pneumonia with patchy multifocal infiltrates   [BM]  1043 Lactic acid is 2.0, the patient has a normal blood pressure of 151/70, temperature of 99.9, borderline tachycardia   [BM]    Clinical Course User Index [BM] Noemi Chapel, MD   MDM Rules/Calculators/A&P                      Unfortunately the patient has developed what appears to be coronavirus pneumonia.  She has acute hypoxic respiratory failure.  She is mildly tachypneic but not in  need of anything more than nasal cannula at this time.  Her symptoms have subjectively progressively worsened over the last 24 hours thus likely needing admission to the hospital.  Will obtain an x-ray, lab work, IV access will be obtained and the patient will likely need to be admitted.  She is currently on oxygen to support her hypoxia.  Though the patient's lactic acid is 2.0 she has no leukocytosis, this is consistent with Covid pneumonia.  June, she has acute hypoxic respiratory failure.  I discussed the case with the hospitalist Dr. Wynetta Emery who has been kind enough to admit  Final Clinical Impression(s) / ED Diagnoses Final diagnoses:  Acute respiratory failure due to COVID-19 [facility])    Rx / DC Orders ED Discharge Orders    None       Noemi Chapel, MD 03/31/19 1147    Noemi Chapel, MD 03/31/19 [phone]

## 2019-03-31 NOTE — H&P (Signed)
History and Physical  Selena Hunt [id number] DOB: 01/11/1951 DOA: 03/31/2019  Referring physician: Sabra Heck MD   PCP: Baruch Gouty, FNP   Chief Complaint: shortness of breath   HPI: Selena Hunt is a 68 y.o. female who was recently diagnosed with COVID-19 by her PCP a couple of days ago presented with increasing shortness of breath cough and chest congestion.  She had one episode of vomiting.  She also having some myalgias and skin sensitivity.  Her main complaint is shortness of breath.  She does have a history of a prior lung injury after major trauma.  She reports no fever but reports that her shortness of breath has only progressed over the past several days.  She had a home pulse ox monitor that was reading in the low 80s.  She reported this to her PCP who recommended that she come to the hospital for further treatment.  EMS evaluated her confirm that she was in the low 80s and started on 2 L nasal cannula of supplemental oxygen which improved her saturations to 90%.  The patient had been started on steroids and albuterol inhaler in the outpatient setting.  There was concern due to her symptoms progressively worsening in the last 24 hours.  The patient was seen in the emergency department and a repeat Covid test was ordered.  Patient had a chest x-ray with findings of multifocal pneumonia with a viral appearance consistent with COVID-19 infection.  She was maintained on 2 L nasal cannula oxygen.  Have a lactic acid of 2.0.  She had a normal WBC.  Her inflammatory markers were only mildly elevated.  Hospital admission was requested for further management.  Review of Systems: All systems reviewed and apart from history of presenting illness, are negative.  Past Medical History:  Diagnosis Date  . Benign neoplasm of colon   . Blood transfusion without reported diagnosis    x 34 to date  . Chronic kidney disease   . Colon polyps   . Diverticulitis of colon (without mention of  hemorrhage)(562.11)   . Diverticulosis of colon (without mention of hemorrhage)   . Essential hypertension, benign   . Female stress incontinence   . GERD (gastroesophageal reflux disease)   . Hematuria   . Hernia of unspecified site of abdominal cavity without mention of obstruction or gangrene   . History of closed head injury 04/21/2018  . Kidney stones    has had one   . Lumbosacral spondylosis without myelopathy   . Meniere disease    Drs are uncertain of this DX   . Migraine headache   . Neuromuscular disorder ([place])   . Obesity   . Other and unspecified hyperlipidemia   . Pneumonia   . Pulmonary embolism ([place])   . Seizures ([place])    HX of --- from medications,2003  . Stricture and stenosis of esophagus   . Thyroid disease   . Ventral hernia    Past Surgical History:  Procedure Laterality Date  . ABDOMINAL HYSTERECTOMY  1990  . APPENDECTOMY    . [place]   Removed  . FEMORAL VARUS OSTEOTOMY W/ ADDUCTOR RELEASE AND ILIAC CREST BONE GRAFT, PELVIC OSTEOTOMY    . FEMUR FRACTURE SURGERY Left 2003  . HERNIA REPAIR     with mesh, abdominal  . HIP FRACTURE SURGERY Left   . HUMERUS FRACTURE SURGERY Left   . LUMBAR FUSION  1995   Fusion L4 - L5  . PARTIAL COLECTOMY  Secondary to diverticulitis  . TENDON TRANSFER    . TUBAL LIGATION  1972  . ULNAR NERVE REPAIR Left    Social History:  reports that she has never smoked. She has never used smokeless tobacco. She reports current alcohol use. She reports that she does not use drugs.  Allergies  Allergen Reactions  . Ambien [Zolpidem Tartrate] Anxiety  . Diovan [Valsartan] Other (See Comments)    Seizures.  . Wellbutrin [Bupropion] Other (See Comments)    Seizures.  . Codeine     Feel crazy and dizzy   . Feldene [Piroxicam]     Swelling   . Guaifenesin Er     Raise Bp / HR   . Latex     Swelling   . Mobic [Meloxicam] Other (See Comments)    Stomach cramps  . Neurontin [Gabapentin]     "loopy"  .  Crestor [Rosuvastatin Calcium] Other (See Comments)    Myalgias   . Lipitor [Atorvastatin Calcium] Other (See Comments)    myalgias  . Livalo [Pitavastatin] Swelling and Other (See Comments)    stomach cramping   . Simcor [Niacin-Simvastatin Er] Other (See Comments)    Stomach cramps  . Zocor [Simvastatin] Other (See Comments)    Stomach cramps    Family History  Problem Relation Age of Onset  . Alzheimer's disease Mother   . Hypertension Mother   . Stroke Mother   . Alzheimer's disease Father   . Hypertension Father   . Gout Brother   . Fibromyalgia Daughter   . Hypertension Son   . Depression Son   . Spondylitis Son        spondylosis  . GER disease Son   . Heart disease Maternal Grandmother   . Heart disease Maternal Grandfather   . Congestive Heart Failure Maternal Grandfather   . Stroke Paternal Grandmother   . Hypertension Paternal Grandmother   . Depression Paternal Grandfather   . Suicidality Paternal Grandfather   . Colon cancer Neg Hx   . Esophageal cancer Neg Hx   . Pancreatic cancer Neg Hx   . Stomach cancer Neg Hx   . Liver disease Neg Hx     Prior to Admission medications   Medication Sig Start Date End Date Taking? Authorizing Provider  albuterol (VENTOLIN HFA) 108 (90 Base) MCG/ACT inhaler Inhale 2 puffs into the lungs every 6 (six) hours as needed for wheezing or shortness of breath. 03/30/19  Yes Hawks, Christy A, FNP  benzonatate (TESSALON) 200 MG capsule Take 1 capsule (200 mg total) by mouth 3 (three) times daily as needed. 03/30/19  Yes Hawks, Theador Hawthorne, FNP  calcium-vitamin D (OSCAL WITH D) 500-200 MG-UNIT tablet Take 1 tablet by mouth daily with breakfast. 09/14/18  Yes Chipper Herb, MD  dexamethasone (DECADRON) 6 MG tablet Take 1 tablet (6 mg total) by mouth daily. 03/30/19  Yes Hawks, Christy A, FNP  esomeprazole (NEXIUM) 40 MG capsule Take 1 capsule (40 mg total) by mouth 2 (two) times daily before a meal. 12/11/18  Yes Rakes, Connye Burkitt, FNP    fluticasone (FLONASE) 50 MCG/ACT nasal spray Place 2 sprays into both nostrils daily. 05/08/18  Yes Dettinger, Fransisca Kaufmann, MD  loratadine (CLARITIN) 10 MG tablet Take 1 tablet (10 mg total) by mouth daily. 12/11/18  Yes Rakes, Connye Burkitt, FNP  metoprolol tartrate (LOPRESSOR) 50 MG tablet Take 0.5 tablets (25 mg total) by mouth daily. 12/11/18  Yes Rakes, Connye Burkitt, FNP  Multiple Vitamins-Minerals (WOMENS 50+ ADVANCED  PO) Take by mouth.   Yes [provider]  pravastatin (PRAVACHOL) 20 MG tablet TAKE 1 TABLET BY MOUTH EVERY DAY 08/10/18  Yes Chipper Herb, MD  SYNTHROID 100 MCG tablet TAKE 1 TABLET BY MOUTH EVERY DAY EXCEPT MONDAY & FRIDAY TAKE 1/2 TABLET 01/04/19  Yes Rakes, Connye Burkitt, FNP  triamterene-hydrochlorothiazide (DYAZIDE) 37.5-25 MG capsule Take 1 each (1 capsule total) by mouth daily. 12/11/18 03/31/19 Yes Rakes, Connye Burkitt, FNP  Vitamin D, Ergocalciferol, (DRISDOL) 1.25 MG (50000 UT) CAPS capsule TAKE 1 CAPSULE BY MOUTH EVERY 7 DAYS 12/11/18  Yes Rakes, Connye Burkitt, FNP  meclizine (ANTIVERT) 25 MG tablet Take 1 tablet (25 mg total) by mouth every 8 (eight) hours as needed for dizziness. Patient not taking: Reported on 03/31/2019 12/11/18   Baruch Gouty, FNP   Physical Exam: Vitals:   03/31/19 0955 03/31/19 0956 03/31/19 1125  BP: (!) 151/70  136/68  Pulse: (!) 101  91  Resp: 18  (!) 21  Temp: 99.9 F (37.7 C)    TempSrc: Oral    SpO2: 94%  95%  Weight:  86.2 kg   Height:  5\' 1"  (1.549 m)      General exam: Moderately built and nourished patient, lying comfortably supine on the gurney in no obvious distress.  Head, eyes and ENT: Nontraumatic and normocephalic. Pupils equally reacting to light and accommodation. Oral mucosa moist.  Respiratory system: Rales in the right lower lobe.  Psychiatry: Pleasant and cooperative.  Labs on Admission:  Basic Metabolic Panel: Recent Labs  Lab 03/31/19 1014  NA 140  K 3.3*  CL 103  CO2 25  GLUCOSE 132*  BUN 26*  CREATININE 1.16*  CALCIUM  9.1   Liver Function Tests: Recent Labs  Lab 03/31/19 1014  AST 42*  ALT 28  ALKPHOS 71  BILITOT 0.7  PROT 7.1  ALBUMIN 3.8   No results for input(s): LIPASE, AMYLASE in the last 168 hours. No results for input(s): AMMONIA in the last 168 hours. CBC: Recent Labs  Lab 03/31/19 1014  WBC 5.9  NEUTROABS 5.2  HGB 14.9  HCT 45.2  MCV 88.6  PLT 118*   Cardiac Enzymes: No results for input(s): CKTOTAL, CKMB, CKMBINDEX, TROPONINI in the last 168 hours.  BNP (last 3 results) No results for input(s): PROBNP in the last 8760 hours. CBG: No results for input(s): GLUCAP in the last 168 hours.  Radiological Exams on Admission: DG Chest Port 1 View  Result Date: 03/31/2019 CLINICAL DATA:  Cough, shortness of breath, recent COVID positive test EXAM: PORTABLE CHEST 1 VIEW COMPARISON:  2019 FINDINGS: Low lung volumes reflecting shallow inspiration. Patchy opacities, left greater than right. No significant pleural effusion. No pneumothorax. Cardiomediastinal contours are likely within normal limits for technique. IMPRESSION: Patchy pulmonary opacities, left greater than right, suspicious for COVID 19 pneumonia. Electronically Signed   By: Macy Mis M.D.   On: 03/31/2019 10:26   Assessment/Plan Principal Problem:   Pneumonia due to COVID-19 virus Active Problems:   Hypertension   Hyperlipidemia   Chronic GERD   History of closed head injury   Hypothyroidism due to acquired atrophy of thyroid   Chronic pain due to trauma   Controlled substance agreement signed   Acute respiratory failure with hypoxia ([place])   Hypokalemia   AKI (acute kidney injury) ([place])   1. Acute respiratory failure with hypoxia secondary to Covid pneumonia-the patient is going to be admitted for supportive therapy and treatment.  Continue supplemental oxygen  2 L nasal cannula. 2. Covid pneumonia-follow inflammatory markers, continue IV steroids as ordered, start remdesivir per pharmacy recommendations,  prone as ordered.  Gentle IV fluid hydration as ordered.  Continue supplemental oxygen as ordered.  Follow inflammatory markers closely. 3. AKI-patient is mildly dehydrated and gentle fluid hydration has been ordered.  Follow-up creatinine daily. 4. GERD-Protonix ordered for GI protection. 5. Essential hypertension-monitor blood pressures closely, resume home medications and follow. 6. Hypokalemia-check magnesium, supplement potassium as needed.  Follow closely. 7. Chronic pain-pain medications ordered as needed. 8. Hyperlipidemia-resume home pravastatin.  DVT Prophylaxis: Lovenox Code Status: Full Family Communication:  Disposition Plan: Inpatient for IV fluids IV steroids and remdesivir treatments  Time spent: 28 mins  Ira Dougher Wynetta Emery, MD [facility] How to contact the [facility] Attending or Consulting provider [place] or covering provider during after hours [place], for this patient?  1. Check the care team in [facility] and look for a) attending/consulting TR[facility] provider listed and b) the [facility] team listed 2. Log into [email or web address] and use [place]'s universal password to access. If you do not have the password, please contact the hospital operator. 3. Locate the [facility] provider you are looking for under [facility] and page to a number that you can be directly reached. 4. If you still have difficulty reaching the provider, please page the [facility] (Director on Call) for the Hospitalists listed on amion for assistance.

## 2019-04-01 ENCOUNTER — Inpatient Hospital Stay (HOSPITAL_COMMUNITY): Payer: Medicare Other

## 2019-04-01 LAB — COMPREHENSIVE METABOLIC PANEL
ALT: 29 U/L (ref 0–44)
AST: 46 U/L — ABNORMAL HIGH (ref 15–41)
Albumin: 3.3 g/dL — ABNORMAL LOW (ref 3.5–5.0)
Alkaline Phosphatase: 65 U/L (ref 38–126)
Anion gap: 11 (ref 5–15)
BUN: 26 mg/dL — ABNORMAL HIGH (ref 8–23)
CO2: 25 mmol/L (ref 22–32)
Calcium: 8.7 mg/dL — ABNORMAL LOW (ref 8.9–10.3)
Chloride: 108 mmol/L (ref 98–111)
Creatinine, Ser: 0.94 mg/dL (ref 0.44–1.00)
GFR calc Af Amer: >60 mL/min (ref >60)
GFR calc non Af Amer: >60 mL/min (ref >60)
Glucose, Bld: 142 mg/dL — ABNORMAL HIGH (ref 70–99)
Potassium: 4.4 mmol/L (ref 3.5–5.1)
Sodium: 144 mmol/L (ref 135–145)
Total Bilirubin: 0.7 mg/dL (ref 0.3–1.2)
Total Protein: 6.2 g/dL — ABNORMAL LOW (ref 6.5–8.1)

## 2019-04-01 LAB — CBC WITH DIFFERENTIAL/PLATELET
Abs Immature Granulocytes: 0.01 10*3/uL (ref 0.00–0.07)
Basophils Absolute: 0 10*3/uL (ref 0.0–0.1)
Basophils Relative: 0 %
Eosinophils Absolute: 0 10*3/uL (ref 0.0–0.5)
Eosinophils Relative: 0 %
HCT: 43.1 % (ref 36.0–46.0)
Hemoglobin: 13.8 g/dL (ref 12.0–15.0)
Immature Granulocytes: 0 %
Lymphocytes Relative: 11 %
Lymphs Abs: 0.5 10*3/uL — ABNORMAL LOW (ref 0.7–4.0)
MCH: 28.8 pg (ref 26.0–34.0)
MCHC: 32 g/dL (ref 30.0–36.0)
MCV: 90 fL (ref 80.0–100.0)
Monocytes Absolute: 0.3 10*3/uL (ref 0.1–1.0)
Monocytes Relative: 6 %
Neutro Abs: 3.5 10*3/uL (ref 1.7–7.7)
Neutrophils Relative %: 83 %
Platelets: 145 10*3/uL — ABNORMAL LOW (ref 150–400)
RBC: 4.79 MIL/uL (ref 3.87–5.11)
RDW: 13.1 % (ref 11.5–15.5)
WBC: 4.2 10*3/uL (ref 4.0–10.5)
nRBC: 0 % (ref 0.0–0.2)

## 2019-04-01 LAB — FERRITIN: Ferritin: 854 ng/mL — ABNORMAL HIGH (ref 11–307)

## 2019-04-01 LAB — SARS CORONAVIRUS 2 (TAT 6-24 HRS): SARS Coronavirus 2: POSITIVE — AB

## 2019-04-01 LAB — D-DIMER, QUANTITATIVE: D-Dimer, Quant: 0.28 ug/mL-FEU (ref 0.00–0.50)

## 2019-04-01 LAB — MAGNESIUM: Magnesium: 1.9 mg/dL (ref 1.7–2.4)

## 2019-04-01 LAB — C-REACTIVE PROTEIN: CRP: 4.7 mg/dL — ABNORMAL HIGH (ref <1.0)

## 2019-04-01 LAB — PHOSPHORUS: Phosphorus: 1.8 mg/dL — ABNORMAL LOW (ref 2.5–4.6)

## 2019-04-01 MED ORDER — ZINC SULFATE 220 (50 ZN) MG PO CAPS
220.0000 mg | ORAL_CAPSULE | Freq: Every day | ORAL | Status: DC
  Administered 2019-04-01 – 2019-04-04 (×4): 220 mg via ORAL
  Filled 2019-04-01 (×4): qty 1

## 2019-04-01 MED ORDER — ASCORBIC ACID 500 MG PO TABS
500.0000 mg | ORAL_TABLET | Freq: Every day | ORAL | Status: DC
  Administered 2019-04-01 – 2019-04-04 (×4): 500 mg via ORAL
  Filled 2019-04-01 (×4): qty 1

## 2019-04-01 NOTE — Progress Notes (Signed)
PROGRESS NOTE    Selena Hunt  [id number] DOB: 25-Dec-1950 DOA: 03/31/2019 PCP: Baruch Gouty, FNP   Brief Narrative:  Per HPI:  Selena Hunt is a 68 y.o. female who was recently diagnosed with COVID-19 by her PCP a couple of days ago presented with increasing shortness of breath cough and chest congestion.  She had one episode of vomiting.  She also having some myalgias and skin sensitivity.  Her main complaint is shortness of breath.  She does have a history of a prior lung injury after major trauma.  She reports no fever but reports that her shortness of breath has only progressed over the past several days.  She had a home pulse ox monitor that was reading in the low 80s.  She reported this to her PCP who recommended that she come to the hospital for further treatment.  EMS evaluated her confirm that she was in the low 80s and started on 2 L nasal cannula of supplemental oxygen which improved her saturations to 90%.  The patient had been started on steroids and albuterol inhaler in the outpatient setting.  There was concern due to her symptoms progressively worsening in the last 24 hours.  The patient was seen in the emergency department and a repeat Covid test was ordered.  Patient had a chest x-ray with findings of multifocal pneumonia with a viral appearance consistent with COVID-19 infection.  She was maintained on 2 L nasal cannula oxygen.  Have a lactic acid of 2.0.  She had a normal WBC.  Her inflammatory markers were only mildly elevated.  Hospital admission was requested for further management.  12/24: Patient is overall doing well this morning and has improvement in creatinine levels with stable vital signs.  She remains on 2 L nasal cannula oxygen and we will continue work on weaning.  Assessment & Plan:   Principal Problem:   Pneumonia due to COVID-19 virus Active Problems:   Hypertension   Hyperlipidemia   Chronic GERD   History of closed head injury   Hypothyroidism due  to acquired atrophy of thyroid   Chronic pain due to trauma   Controlled substance agreement signed   Acute respiratory failure with hypoxia ([place])   Hypokalemia   AKI (acute kidney injury) ([place])   1. Acute respiratory failure with hypoxia secondary to Covid pneumonia-the patient is going to be admitted for supportive therapy and treatment.  Continue supplemental oxygen 2 L nasal cannula and wean as tolerated. 2. Covid pneumonia-follow inflammatory markers, continue IV steroids as ordered, start remdesivir per pharmacy recommendations, prone as ordered.  Gentle IV fluid hydration as ordered.  Start zinc and vitamin C. Continue supplemental oxygen as ordered.  Follow inflammatory markers closely. 3. AKI-patient is mildly dehydrated and gentle fluid hydration has been ordered.  Follow-up creatinine daily.  This is improving with IV fluid which will be continued.  Monitor in a.m. 4. GERD-Protonix ordered for GI protection. 5. Essential hypertension-monitor blood pressures closely, resume home medications and follow. 6. Hypokalemia-currently resolved.  Recheck in a.m. 7. Chronic pain-pain medications ordered as needed. 8. Hyperlipidemia-resume home pravastatin.   DVT prophylaxis:Lovenox Code Status: Full Family Communication: None at bedside Disposition Plan: Continue treatments for Covid pneumonia and hydration for AKI.  Wean oxygen as tolerated.   Consultants:   None  Procedures:   None  Antimicrobials:  Anti-infectives (From admission, onward)   Start     Dose/Rate Route Frequency Ordered Stop   04/01/19 1000  remdesivir 100 mg  in sodium chloride 0.9 % 100 mL IVPB  Status:  Discontinued     100 mg 200 mL/hr over 30 Minutes Intravenous Daily 03/31/19 1702 03/31/19 1707   04/01/19 1000  remdesivir 100 mg in sodium chloride 0.9 % 100 mL IVPB     100 mg 200 mL/hr over 30 Minutes Intravenous Daily 03/31/19 1211 04/05/19 0959   03/31/19 1702  remdesivir 200 mg in sodium chloride 0.9%  250 mL IVPB  Status:  Discontinued     200 mg 580 mL/hr over 30 Minutes Intravenous Once 03/31/19 1702 03/31/19 1707   03/31/19 1300  remdesivir 200 mg in sodium chloride 0.9% 250 mL IVPB     200 mg 580 mL/hr over 30 Minutes Intravenous Once 03/31/19 1211 03/31/19 1406       Subjective: Patient seen and evaluated today with no new acute complaints or concerns. No acute concerns or events noted overnight.  She remains on 2 L nasal cannula oxygen without any significant issues.  Objective: Vitals:   03/31/19 1630 03/31/19 1744 03/31/19 2136 04/01/19 0618  BP: 126/68 131/63 115/62 (!) 120/57  Pulse: 73 74 64 64  Resp: 14 16 16 18   Temp:  100.1 F (37.8 C) 98.7 F (37.1 C) 98.1 F (36.7 C)  TempSrc:  Oral Oral Oral  SpO2: 93% 100% 95% 99%  Weight:  87.3 kg    Height:  5\' 1"  (1.549 m)      Intake/Output Summary (Last 24 hours) at 04/01/2019 1204 Last data filed at 04/01/2019 0900 Gross per 24 hour  Intake 924.11 ml  Output -  Net 924.11 ml   Filed Weights   03/31/19 0956 03/31/19 1744  Weight: 86.2 kg 87.3 kg    Examination:  General exam: Appears calm and comfortable  Respiratory system: Clear to auscultation. Respiratory effort normal.  Currently on 2 L nasal cannula oxygen. Cardiovascular system: S1 & S2 heard, RRR. No JVD, murmurs, rubs, gallops or clicks. No pedal edema. Gastrointestinal system: Abdomen is nondistended, soft and nontender. No organomegaly or masses felt. Normal bowel sounds heard. Central nervous system: Alert and oriented. No focal neurological deficits. Extremities: Symmetric 5 x 5 power. Skin: No rashes, lesions or ulcers Psychiatry: Judgement and insight appear normal. Mood & affect appropriate.     Data Reviewed: I have personally reviewed following labs and imaging studies  CBC: Recent Labs  Lab 03/31/19 1014 04/01/19 0831  WBC 5.9 4.2  NEUTROABS 5.2 3.5  HGB 14.9 13.8  HCT 45.2 43.1  MCV 88.6 90.0  PLT 118* 145*   Basic  Metabolic Panel: Recent Labs  Lab 03/31/19 1014 04/01/19 0623  NA 140 144  K 3.3* 4.4  CL 103 108  CO2 25 25  GLUCOSE 132* 142*  BUN 26* 26*  CREATININE 1.16* 0.94  CALCIUM 9.1 8.7*  MG  --  1.9  PHOS  --  1.8*   GFR: Estimated Creatinine Clearance: 55.9 mL/min (by C-G formula based on SCr of 0.94 mg/dL). Liver Function Tests: Recent Labs  Lab 03/31/19 1014 04/01/19 0623  AST 42* 46*  ALT 28 29  ALKPHOS 71 65  BILITOT 0.7 0.7  PROT 7.1 6.2*  ALBUMIN 3.8 3.3*   No results for input(s): LIPASE, AMYLASE in the last 168 hours. No results for input(s): AMMONIA in the last 168 hours. Coagulation Profile: No results for input(s): INR, PROTIME in the last 168 hours. Cardiac Enzymes: No results for input(s): CKTOTAL, CKMB, CKMBINDEX, TROPONINI in the last 168 hours. BNP (last 3  results) No results for input(s): PROBNP in the last 8760 hours. HbA1C: No results for input(s): HGBA1C in the last 72 hours. CBG: No results for input(s): GLUCAP in the last 168 hours. Lipid Profile: Recent Labs    03/31/19 1014  TRIG 110   Thyroid Function Tests: No results for input(s): TSH, T4TOTAL, FREET4, T3FREE, THYROIDAB in the last 72 hours. Anemia Panel: Recent Labs    03/31/19 1014 04/01/19 0623  FERRITIN 854* 854*   Sepsis Labs: Recent Labs  Lab 03/31/19 1014 03/31/19 1315  PROCALCITON <0.10  --   LATICACIDVEN 2.0* 0.9    Recent Results (from the past 240 hour(s))  SARS CORONAVIRUS 2 (TAT 6-24 HRS) Nasopharyngeal Nasopharyngeal Swab     Status: Abnormal   Collection Time: 03/31/19  9:57 AM   Specimen: Nasopharyngeal Swab  Result Value Ref Range Status   SARS Coronavirus 2 POSITIVE (A) NEGATIVE Final    Comment: RESULT CALLED TO, READ BACK BY AND VERIFIED WITH: C.ROSE,RN [id number] 04/01/19 G.MCADOO (NOTE) SARS-CoV-2 target nucleic acids are DETECTED. The SARS-CoV-2 RNA is generally detectable in upper and lower respiratory specimens during the acute phase of infection.  Positive results are indicative of the presence of SARS-CoV-2 RNA. Clinical correlation with patient history and other diagnostic information is  necessary to determine patient infection status. Positive results do not rule out bacterial infection or co-infection with other viruses.  The expected result is Negative. Fact Sheet for Patients: SugarRoll.be Fact Sheet for Healthcare Providers: https://www.woods-mathews.com/ This test is not yet approved or cleared by the [place] FDA and  has been authorized for detection and/or diagnosis of SARS-CoV-2 by FDA under an Emergency Use Authorization (EUA). This EUA will remain  in effect (meaning this test can be used) for the du ration of the COVID-19 declaration under Section 564(b)(1) of the Act, 21 U.S.C. section 360bbb-3(b)(1), unless the authorization is terminated or revoked sooner. Performed at [facility], [place] [address], [place], [place] [address]   Blood Culture (routine x 2)     Status: None (Preliminary result)   Collection Time: 03/31/19 10:54 AM   Specimen: BLOOD RIGHT HAND  Result Value Ref Range Status   Specimen Description BLOOD RIGHT HAND  Final   Special Requests   Final    BOTTLES DRAWN AEROBIC AND ANAEROBIC Blood Culture adequate volume   Culture   Final    NO GROWTH < 24 HOURS Performed at [facility], [address] [place], [place] [address]    Report Status PENDING  Incomplete  Blood Culture (routine x 2)     Status: None (Preliminary result)   Collection Time: 03/31/19  1:15 PM   Specimen: BLOOD RIGHT FOREARM  Result Value Ref Range Status   Specimen Description BLOOD RIGHT FOREARM  Final   Special Requests   Final    BOTTLES DRAWN AEROBIC ONLY Blood Culture adequate volume   Culture   Final    NO GROWTH < 24 HOURS Performed at [facility], [address] [place], [place] [address]    Report Status PENDING  Incomplete         Radiology Studies:  Portable chest 1 View  Result Date: 04/01/2019 CLINICAL DATA:  COVID pneumonia. EXAM: PORTABLE CHEST 1 VIEW COMPARISON:  03/31/2019 FINDINGS: The cardiac silhouette, mediastinal and hilar contours are within normal limits. Persistent patchy bilateral infiltrates but improved aeration, particularly in the left lower lobe. No pleural effusions. IMPRESSION: Persistent but improved bilateral infiltrates. Electronically Signed   By: Mamie Nick.  Gallerani M.D.   On: 04/01/2019 05:21   DG Chest Port 1 View  Result Date: 03/31/2019 CLINICAL DATA:  Cough, shortness of breath, recent COVID positive test EXAM: PORTABLE CHEST 1 VIEW COMPARISON:  2019 FINDINGS: Low lung volumes reflecting shallow inspiration. Patchy opacities, left greater than right. No significant pleural effusion. No pneumothorax. Cardiomediastinal contours are likely within normal limits for technique. IMPRESSION: Patchy pulmonary opacities, left greater than right, suspicious for COVID 19 pneumonia. Electronically Signed   By: Macy Mis M.D.   On: 03/31/2019 10:26        Scheduled Meds: . calcium-vitamin D  1 tablet Oral Q breakfast  . dexamethasone (DECADRON) injection  6 mg Intravenous Q24H  . enoxaparin (LOVENOX) injection  40 mg Subcutaneous Q24H  . fluticasone  2 spray Each Nare Daily  . levothyroxine  100 mcg Oral Q0600  . loratadine  10 mg Oral Daily  . metoprolol tartrate  25 mg Oral Daily  . pantoprazole  40 mg Oral BID  . pravastatin  20 mg Oral q1800  . Vitamin D (Ergocalciferol)  50,000 Units Oral Q7 days   Continuous Infusions: . sodium chloride 60 mL/hr at 04/01/19 1007  . remdesivir 100 mg in NS 100 mL 100 mg (04/01/19 1009)     LOS: 1 day    Time spent: 30 minutes    Negar Sieler Darleen Crocker, DO [facility] Pager [phone]  If 7PM-7AM, please contact night-coverage [email or web address] Password [facility] 04/01/2019, 12:04 PM

## 2019-04-01 NOTE — Evaluation (Signed)
Physical Therapy Evaluation Patient Details Name: Selena Hunt MRN: [id number] DOB: 12-05-1950 Today's Date: 04/01/2019   History of Present Illness  Selena Hunt is a 67 y.o. female who was recently diagnosed with COVID-19 by her PCP a couple of days ago presented with increasing shortness of breath cough and chest congestion.  She had one episode of vomiting.  She also having some myalgias and skin sensitivity.  Her main complaint is shortness of breath.  She does have a history of a prior lung injury after major trauma.  She reports no fever but reports that her shortness of breath has only progressed over the past several days.  She had a home pulse ox monitor that was reading in the low 80s.  She reported this to her PCP who recommended that she come to the hospital for further treatment.  EMS evaluated her confirm that she was in the low 80s and started on 2 L nasal cannula of supplemental oxygen which improved her saturations to 90%.  The patient had been started on steroids and albuterol inhaler in the outpatient setting.  There was concern due to her symptoms progressively worsening in the last 24 hours.  The patient was seen in the emergency department and a repeat Covid test was ordered.  Patient had a chest x-ray with findings of multifocal pneumonia with a viral appearance consistent with COVID-19 infection.  She was maintained on 2 L nasal cannula oxygen.  Have a lactic acid of 2.0.  She had a normal WBC.  Her inflammatory markers were only mildly elevated.  Hospital admission was requested for further management.    Clinical Impression  Patient functioning near baseline for functional mobility and gait, other than limited due to mild SOB with SpO2 dropping from 94% to 85% while on room air during ambulation without loss of balance.  Patient encouraged to ambulate as tolerated in room and left sitting in chair - RN notified.  Plan:  Patient discharged from physical therapy to care of  nursing for ambulation daily as tolerated for length of stay.    Follow Up Recommendations No PT follow up    Equipment Recommendations  None recommended by PT    Recommendations for Other Services       Precautions / Restrictions Precautions Precautions: None Restrictions Weight Bearing Restrictions: No      Mobility  Bed Mobility Overal bed mobility: Modified Independent             General bed mobility comments: slightly increased time  Transfers Overall transfer level: Modified independent               General transfer comment: increased time  Ambulation/Gait Ambulation/Gait assistance: Modified independent (Device/Increase time) Gait Distance (Feet): 65 Feet Assistive device: None Gait Pattern/deviations: WFL(Within Functional Limits) Gait velocity: decreased   General Gait Details: good return for ambulation in room without loss of balance, on room air with SpO2 dropping from 94 - 85%, limited secondary to mild SOB  Stairs            Wheelchair Mobility    Modified Rankin (Stroke Patients Only)       Balance Overall balance assessment: No apparent balance deficits (not formally assessed)                                           Pertinent Vitals/Pain Pain Assessment: No/denies  pain    Home Living Family/patient expects to be discharged to:: Private residence Living Arrangements: Alone Available Help at Discharge: Family;Available 24 hours/day(Her daughter is planning to stay with her after DC home) Type of Home: House Home Access: Stairs to enter Entrance Stairs-Rails: Left;Right;Can reach both Entrance Stairs-Number of Steps: 2 steps into front with bilateral side rails, 1 step through [place]: One level Home Equipment: Walker - 2 wheels;Cane - single point;Shower seat      Prior Function Level of Independence: Independent         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         Extremity/Trunk Assessment   Upper Extremity Assessment Upper Extremity Assessment: Overall WFL for tasks assessed    Lower Extremity Assessment Lower Extremity Assessment: Overall WFL for tasks assessed    Cervical / Trunk Assessment Cervical / Trunk Assessment: Normal  Communication   Communication: No difficulties  Cognition Arousal/Alertness: Awake/alert Behavior During Therapy: WFL for tasks assessed/performed Overall Cognitive Status: Within Functional Limits for tasks assessed                                        General Comments      Exercises     Assessment/Plan    PT Assessment Patent does not need any further PT services  PT Problem List         PT Treatment Interventions      PT Goals (Current goals can be found in the Care Plan section)  Acute Rehab PT Goals Patient Stated Goal: return home with family to assist PT Goal Formulation: With patient Time For Goal Achievement: 04/01/19 Potential to Achieve Goals: Good    Frequency     Barriers to discharge        Co-evaluation               AM-PAC PT "6 Clicks" Mobility  Outcome Measure Help needed turning from your back to your side while in a flat bed without using bedrails?: None Help needed moving from lying on your back to sitting on the side of a flat bed without using bedrails?: None Help needed moving to and from a bed to a chair (including a wheelchair)?: None Help needed standing up from a chair using your arms (e.g., wheelchair or bedside chair)?: None Help needed to walk in hospital room?: None Help needed climbing 3-5 steps with a railing? : None 6 Click Score: 24    End of Session Equipment Utilized During Treatment: Oxygen Activity Tolerance: Patient tolerated treatment well;Patient limited by fatigue Patient left: in chair;with call bell/phone within reach Nurse Communication: Mobility status PT Visit Diagnosis: Unsteadiness on feet (R26.81);Other  abnormalities of gait and mobility (R26.89);Muscle weakness (generalized) (M62.81)    Time: [id number] PT Time Calculation (min) (ACUTE ONLY): 22 min   Charges:   PT Evaluation $PT Eval Moderate Complexity: 1 Mod PT Treatments $Therapeutic Activity: 8-22 mins        12:32 PM, 04/01/19 Lonell Grandchild, MPT Physical Therapist with [facility] [phone] office [phone] mobile phone

## 2019-04-02 LAB — CBC WITH DIFFERENTIAL/PLATELET
Abs Immature Granulocytes: 0.01 10*3/uL (ref 0.00–0.07)
Basophils Absolute: 0 10*3/uL (ref 0.0–0.1)
Basophils Relative: 0 %
Eosinophils Absolute: 0 10*3/uL (ref 0.0–0.5)
Eosinophils Relative: 0 %
HCT: 44.1 % (ref 36.0–46.0)
Hemoglobin: 13.8 g/dL (ref 12.0–15.0)
Immature Granulocytes: 0 %
Lymphocytes Relative: 9 %
Lymphs Abs: 0.5 10*3/uL — ABNORMAL LOW (ref 0.7–4.0)
MCH: 28.6 pg (ref 26.0–34.0)
MCHC: 31.3 g/dL (ref 30.0–36.0)
MCV: 91.3 fL (ref 80.0–100.0)
Monocytes Absolute: 0.5 10*3/uL (ref 0.1–1.0)
Monocytes Relative: 9 %
Neutro Abs: 4.5 10*3/uL (ref 1.7–7.7)
Neutrophils Relative %: 82 %
Platelets: 175 10*3/uL (ref 150–400)
RBC: 4.83 MIL/uL (ref 3.87–5.11)
RDW: 13.2 % (ref 11.5–15.5)
WBC: 5.6 10*3/uL (ref 4.0–10.5)
nRBC: 0 % (ref 0.0–0.2)

## 2019-04-02 LAB — COMPREHENSIVE METABOLIC PANEL
ALT: 42 U/L (ref 0–44)
AST: 48 U/L — ABNORMAL HIGH (ref 15–41)
Albumin: 3.3 g/dL — ABNORMAL LOW (ref 3.5–5.0)
Alkaline Phosphatase: 62 U/L (ref 38–126)
Anion gap: 9 (ref 5–15)
BUN: 28 mg/dL — ABNORMAL HIGH (ref 8–23)
CO2: 25 mmol/L (ref 22–32)
Calcium: 9.1 mg/dL (ref 8.9–10.3)
Chloride: 111 mmol/L (ref 98–111)
Creatinine, Ser: 0.88 mg/dL (ref 0.44–1.00)
GFR calc Af Amer: >60 mL/min (ref >60)
GFR calc non Af Amer: >60 mL/min (ref >60)
Glucose, Bld: 128 mg/dL — ABNORMAL HIGH (ref 70–99)
Potassium: 4.1 mmol/L (ref 3.5–5.1)
Sodium: 145 mmol/L (ref 135–145)
Total Bilirubin: 0.6 mg/dL (ref 0.3–1.2)
Total Protein: 6.1 g/dL — ABNORMAL LOW (ref 6.5–8.1)

## 2019-04-02 LAB — MAGNESIUM: Magnesium: 2 mg/dL (ref 1.7–2.4)

## 2019-04-02 LAB — D-DIMER, QUANTITATIVE: D-Dimer, Quant: 0.31 ug/mL-FEU (ref 0.00–0.50)

## 2019-04-02 LAB — C-REACTIVE PROTEIN: CRP: 1.4 mg/dL — ABNORMAL HIGH (ref <1.0)

## 2019-04-02 LAB — PHOSPHORUS: Phosphorus: 2.2 mg/dL — ABNORMAL LOW (ref 2.5–4.6)

## 2019-04-02 LAB — FERRITIN: Ferritin: 772 ng/mL — ABNORMAL HIGH (ref 11–307)

## 2019-04-02 MED ORDER — ORAL CARE MOUTH RINSE
15.0000 mL | Freq: Two times a day (BID) | OROMUCOSAL | Status: DC
  Administered 2019-04-03 – 2019-04-04 (×3): 15 mL via OROMUCOSAL

## 2019-04-02 NOTE — Progress Notes (Signed)
PROGRESS NOTE    Selena Hunt  [id number] DOB: [birth year] DOA: 03/31/2019 PCP: Baruch Gouty, FNP   Brief Narrative:  Per HPI: Selena Hunt a 68 y.o.femalewho was recently diagnosed with COVID-19 by her PCP a couple of days ago presented with increasing shortness of breath cough and chest congestion. She had one episode of vomiting. She also having some myalgias and skin sensitivity. Her main complaint is shortness of breath. She does have a history of a prior lung injury after major trauma. She reports no fever but reports that her shortness of breath has only progressed over the past several days. She had a home pulse ox monitor that was reading in the low 80s. She reported this to her PCP who recommended that she come to the hospital for further treatment. EMS evaluated her confirm that she was in the low 80s and started on 2 L nasal cannula of supplemental oxygen which improved her saturations to 90%. The patient had been started on steroids and albuterol inhaler in the outpatient setting. There was concern due to her symptoms progressively worsening in the last 24 hours. The patient was seen in the emergency department and a repeat Covid test was ordered. Patient had a chest x-ray with findings of multifocal pneumonia with a viral appearance consistent with COVID-19 infection. She was maintained on 2 L nasal cannula oxygen. Have a lactic acid of 2.0. She had a normal WBC. Her inflammatory markers were only mildly elevated. Hospital admission was requested for further management.  12/24: Patient is overall doing well this morning and has improvement in creatinine levels with stable vital signs.  She remains on 2 L nasal cannula oxygen and we will continue work on weaning.  12/25: Patient continues to do quite well without any worsening shortness of breath.  She still remains on 2 L nasal cannula oxygen.  She requires 2 more days of treatment on remdesivir.  Stressed  use of incentive spirometry today.  Assessment & Plan:   Principal Problem:   Pneumonia due to COVID-19 virus Active Problems:   Hypertension   Hyperlipidemia   Chronic GERD   History of closed head injury   Hypothyroidism due to acquired atrophy of thyroid   Chronic pain due to trauma   Controlled substance agreement signed   Acute respiratory failure with hypoxia ([place])   Hypokalemia   AKI (acute kidney injury) ([place])   1. Acute respiratory failure with hypoxia secondary to Covid pneumonia-the patient is going to be admitted for supportive therapy and treatment. Continue supplemental oxygen 2 L nasal cannula and wean as tolerated.  Use incentive spirometer today. 2. Covid pneumonia-follow inflammatory markers, continue IV steroids as ordered, start remdesivir per pharmacy recommendations, prone as ordered. Gentle IV fluid hydration as ordered.  Start zinc and vitamin C.Continue supplemental oxygen as ordered. Follow inflammatory markers closely. 3. AKI-patient is mildly dehydrated and now resolved.  Plan to hold any further IV fluid today. 4. GERD-Protonix ordered for GI protection. 5. Essential hypertension-monitor blood pressures closely, resume home medications and follow. 6. Hypokalemia-currently resolved.  Recheck in a.m. 7. Chronic pain-pain medications ordered as needed. 8. Hyperlipidemia-resume home pravastatin.   DVT prophylaxis:Lovenox Code Status: Full Family Communication: None at bedside Disposition Plan: Continue treatments for Covid pneumonia through 12/27.  Wean oxygen as tolerated.   Consultants:   None  Procedures:   None  Antimicrobials:  Anti-infectives (From admission, onward)   Start     Dose/Rate Route Frequency Ordered Stop   04/01/19  1000  remdesivir 100 mg in sodium chloride 0.9 % 100 mL IVPB  Status:  Discontinued     100 mg 200 mL/hr over 30 Minutes Intravenous Daily 03/31/19 1702 03/31/19 1707   04/01/19 1000  remdesivir 100 mg in  sodium chloride 0.9 % 100 mL IVPB     100 mg 200 mL/hr over 30 Minutes Intravenous Daily 03/31/19 1211 04/05/19 0959   03/31/19 1702  remdesivir 200 mg in sodium chloride 0.9% 250 mL IVPB  Status:  Discontinued     200 mg 580 mL/hr over 30 Minutes Intravenous Once 03/31/19 1702 03/31/19 1707   03/31/19 1300  remdesivir 200 mg in sodium chloride 0.9% 250 mL IVPB     200 mg 580 mL/hr over 30 Minutes Intravenous Once 03/31/19 1211 03/31/19 1406       Subjective: Patient seen and evaluated today with no new acute complaints or concerns. No acute concerns or events noted overnight.  She still remains on 2 L nasal cannula oxygen without any worsening shortness of breath.  Objective: Vitals:   04/01/19 1342 04/01/19 2116 04/02/19 0554 04/02/19 0914  BP: 126/82 126/67 124/68 (!) 122/57  Pulse: 62 74 68 76  Resp: 17 16 16 16   Temp: 98.1 F (36.7 C) 98.2 F (36.8 C) 97.9 F (36.6 C) 98.3 F (36.8 C)  TempSrc: Oral Oral Oral Oral  SpO2: 97% 96% 94% 94%  Weight:      Height:        Intake/Output Summary (Last 24 hours) at 04/02/2019 1257 Last data filed at 04/02/2019 0500 Gross per 24 hour  Intake 480 ml  Output 700 ml  Net -220 ml   Filed Weights   03/31/19 0956 03/31/19 1744  Weight: 86.2 kg 87.3 kg    Examination:  General exam: Appears calm and comfortable  Respiratory system: Clear to auscultation. Respiratory effort normal.  Currently on 2 L nasal cannula oxygen. Cardiovascular system: S1 & S2 heard, RRR. No JVD, murmurs, rubs, gallops or clicks. No pedal edema. Gastrointestinal system: Abdomen is nondistended, soft and nontender. No organomegaly or masses felt. Normal bowel sounds heard. Central nervous system: Alert and oriented. No focal neurological deficits. Extremities: Symmetric 5 x 5 power. Skin: No rashes, lesions or ulcers Psychiatry: Judgement and insight appear normal. Mood & affect appropriate.     Data Reviewed: I have personally reviewed following  labs and imaging studies  CBC: Recent Labs  Lab 03/31/19 1014 04/01/19 0831 04/02/19 0743  WBC 5.9 4.2 5.6  NEUTROABS 5.2 3.5 4.5  HGB 14.9 13.8 13.8  HCT 45.2 43.1 44.1  MCV 88.6 90.0 91.3  PLT 118* 145* 0000000   Basic Metabolic Panel: Recent Labs  Lab 03/31/19 1014 04/01/19 0623 04/02/19 0743  NA 140 144 145  K 3.3* 4.4 4.1  CL 103 108 111  CO2 25 25 25   GLUCOSE 132* 142* 128*  BUN 26* 26* 28*  CREATININE 1.16* 0.94 0.88  CALCIUM 9.1 8.7* 9.1  MG  --  1.9 2.0  PHOS  --  1.8* 2.2*   GFR: Estimated Creatinine Clearance: 59.7 mL/min (by C-G formula based on SCr of 0.88 mg/dL). Liver Function Tests: Recent Labs  Lab 03/31/19 1014 04/01/19 0623 04/02/19 0743  AST 42* 46* 48*  ALT 28 29 42  ALKPHOS 71 65 62  BILITOT 0.7 0.7 0.6  PROT 7.1 6.2* 6.1*  ALBUMIN 3.8 3.3* 3.3*   No results for input(s): LIPASE, AMYLASE in the last 168 hours. No results for input(s): AMMONIA  in the last 168 hours. Coagulation Profile: No results for input(s): INR, PROTIME in the last 168 hours. Cardiac Enzymes: No results for input(s): CKTOTAL, CKMB, CKMBINDEX, TROPONINI in the last 168 hours. BNP (last 3 results) No results for input(s): PROBNP in the last 8760 hours. HbA1C: No results for input(s): HGBA1C in the last 72 hours. CBG: No results for input(s): GLUCAP in the last 168 hours. Lipid Profile: Recent Labs    03/31/19 1014  TRIG 110   Thyroid Function Tests: No results for input(s): TSH, T4TOTAL, FREET4, T3FREE, THYROIDAB in the last 72 hours. Anemia Panel: Recent Labs    04/01/19 0623 04/02/19 0743  FERRITIN 854* 772*   Sepsis Labs: Recent Labs  Lab 03/31/19 1014 03/31/19 1315  PROCALCITON <0.10  --   LATICACIDVEN 2.0* 0.9    Recent Results (from the past 240 hour(s))  SARS CORONAVIRUS 2 (TAT 6-24 HRS) Nasopharyngeal Nasopharyngeal Swab     Status: Abnormal   Collection Time: 03/31/19  9:57 AM   Specimen: Nasopharyngeal Swab  Result Value Ref Range  Status   SARS Coronavirus 2 POSITIVE (A) NEGATIVE Final    Comment: RESULT CALLED TO, READ BACK BY AND VERIFIED WITH: C.ROSE,RN [id number] 04/01/19 G.MCADOO (NOTE) SARS-CoV-2 target nucleic acids are DETECTED. The SARS-CoV-2 RNA is generally detectable in upper and lower respiratory specimens during the acute phase of infection. Positive results are indicative of the presence of SARS-CoV-2 RNA. Clinical correlation with patient history and other diagnostic information is  necessary to determine patient infection status. Positive results do not rule out bacterial infection or co-infection with other viruses.  The expected result is Negative. Fact Sheet for Patients: SugarRoll.be Fact Sheet for Healthcare Providers: https://www.woods-mathews.com/ This test is not yet approved or cleared by the [place] FDA and  has been authorized for detection and/or diagnosis of SARS-CoV-2 by FDA under an Emergency Use Authorization (EUA). This EUA will remain  in effect (meaning this test can be used) for the du ration of the COVID-19 declaration under Section 564(b)(1) of the Act, 21 U.S.C. section 360bbb-3(b)(1), unless the authorization is terminated or revoked sooner. Performed at [facility], [place] [address] [place], [place] [address]   Blood Culture (routine x 2)     Status: None (Preliminary result)   Collection Time: 03/31/19 10:54 AM   Specimen: BLOOD RIGHT HAND  Result Value Ref Range Status   Specimen Description BLOOD RIGHT HAND  Final   Special Requests   Final    BOTTLES DRAWN AEROBIC AND ANAEROBIC Blood Culture adequate volume   Culture   Final    NO GROWTH 2 DAYS Performed at [facility], [address], [place], [place] [address]    Report Status PENDING  Incomplete  Blood Culture (routine x 2)     Status: None (Preliminary result)   Collection Time: 03/31/19  1:15 PM   Specimen: BLOOD RIGHT FOREARM  Result Value Ref Range  Status   Specimen Description BLOOD RIGHT FOREARM  Final   Special Requests   Final    BOTTLES DRAWN AEROBIC ONLY Blood Culture adequate volume   Culture   Final    NO GROWTH 2 DAYS Performed at [facility], [address] [place], [place] [address]    Report Status PENDING  Incomplete         Radiology Studies: Portable chest 1 View  Result Date: 04/01/2019 CLINICAL DATA:  COVID pneumonia. EXAM: PORTABLE CHEST 1 VIEW COMPARISON:  03/31/2019 FINDINGS: The cardiac silhouette, mediastinal and  hilar contours are within normal limits. Persistent patchy bilateral infiltrates but improved aeration, particularly in the left lower lobe. No pleural effusions. IMPRESSION: Persistent but improved bilateral infiltrates. Electronically Signed   By: Marijo Sanes M.D.   On: 04/01/2019 05:21        Scheduled Meds: . vitamin C  500 mg Oral Daily  . calcium-vitamin D  1 tablet Oral Q breakfast  . dexamethasone (DECADRON) injection  6 mg Intravenous Q24H  . enoxaparin (LOVENOX) injection  40 mg Subcutaneous Q24H  . fluticasone  2 spray Each Nare Daily  . levothyroxine  100 mcg Oral Q0600  . loratadine  10 mg Oral Daily  . metoprolol tartrate  25 mg Oral Daily  . pantoprazole  40 mg Oral BID  . pravastatin  20 mg Oral q1800  . Vitamin D (Ergocalciferol)  50,000 Units Oral Q7 days  . zinc sulfate  220 mg Oral Daily   Continuous Infusions: . remdesivir 100 mg in NS 100 mL 100 mg (04/02/19 0938)     LOS: 2 days    Time spent: 30 minutes    [place] Darleen Crocker, DO [facility] Pager [phone]  If 7PM-7AM, please contact night-coverage [email or web address] Password [facility] 04/02/2019, 12:57 PM

## 2019-04-03 ENCOUNTER — Inpatient Hospital Stay (HOSPITAL_COMMUNITY): Payer: Medicare Other

## 2019-04-03 LAB — COMPREHENSIVE METABOLIC PANEL
ALT: 42 U/L (ref 0–44)
AST: 42 U/L — ABNORMAL HIGH (ref 15–41)
Albumin: 3.2 g/dL — ABNORMAL LOW (ref 3.5–5.0)
Alkaline Phosphatase: 59 U/L (ref 38–126)
Anion gap: 10 (ref 5–15)
BUN: 31 mg/dL — ABNORMAL HIGH (ref 8–23)
CO2: 22 mmol/L (ref 22–32)
Calcium: 8.8 mg/dL — ABNORMAL LOW (ref 8.9–10.3)
Chloride: 110 mmol/L (ref 98–111)
Creatinine, Ser: 0.97 mg/dL (ref 0.44–1.00)
GFR calc Af Amer: >60 mL/min (ref >60)
GFR calc non Af Amer: 59 mL/min — ABNORMAL LOW (ref >60)
Glucose, Bld: 96 mg/dL (ref 70–99)
Potassium: 3.7 mmol/L (ref 3.5–5.1)
Sodium: 142 mmol/L (ref 135–145)
Total Bilirubin: 0.8 mg/dL (ref 0.3–1.2)
Total Protein: 5.7 g/dL — ABNORMAL LOW (ref 6.5–8.1)

## 2019-04-03 LAB — CBC WITH DIFFERENTIAL/PLATELET
Abs Immature Granulocytes: 0.04 10*3/uL (ref 0.00–0.07)
Basophils Absolute: 0 10*3/uL (ref 0.0–0.1)
Basophils Relative: 0 %
Eosinophils Absolute: 0 10*3/uL (ref 0.0–0.5)
Eosinophils Relative: 0 %
HCT: 44 % (ref 36.0–46.0)
Hemoglobin: 14 g/dL (ref 12.0–15.0)
Immature Granulocytes: 1 %
Lymphocytes Relative: 13 %
Lymphs Abs: 1.1 10*3/uL (ref 0.7–4.0)
MCH: 29.1 pg (ref 26.0–34.0)
MCHC: 31.8 g/dL (ref 30.0–36.0)
MCV: 91.5 fL (ref 80.0–100.0)
Monocytes Absolute: 0.8 10*3/uL (ref 0.1–1.0)
Monocytes Relative: 9 %
Neutro Abs: 6.6 10*3/uL (ref 1.7–7.7)
Neutrophils Relative %: 77 %
Platelets: 167 10*3/uL (ref 150–400)
RBC: 4.81 MIL/uL (ref 3.87–5.11)
RDW: 13.1 % (ref 11.5–15.5)
WBC: 8.6 10*3/uL (ref 4.0–10.5)
nRBC: 0 % (ref 0.0–0.2)

## 2019-04-03 LAB — PHOSPHORUS: Phosphorus: 2.2 mg/dL — ABNORMAL LOW (ref 2.5–4.6)

## 2019-04-03 LAB — C-REACTIVE PROTEIN: CRP: 1.2 mg/dL — ABNORMAL HIGH (ref <1.0)

## 2019-04-03 LAB — D-DIMER, QUANTITATIVE: D-Dimer, Quant: 0.52 ug/mL-FEU — ABNORMAL HIGH (ref 0.00–0.50)

## 2019-04-03 LAB — FERRITIN: Ferritin: 624 ng/mL — ABNORMAL HIGH (ref 11–307)

## 2019-04-03 LAB — MAGNESIUM: Magnesium: 1.9 mg/dL (ref 1.7–2.4)

## 2019-04-03 NOTE — Progress Notes (Signed)
PROGRESS NOTE    Selena Hunt  [id number] DOB: May 01, 1950 DOA: 03/31/2019 PCP: Baruch Gouty, FNP   Brief Narrative:  Per HPI: Selena Hunt a 68 y.o.femalewho was recently diagnosed with COVID-19 by her PCP a couple of days ago presented with increasing shortness of breath cough and chest congestion. She had one episode of vomiting. She also having some myalgias and skin sensitivity. Her main complaint is shortness of breath. She does have a history of a prior lung injury after major trauma. She reports no fever but reports that her shortness of breath has only progressed over the past several days. She had a home pulse ox monitor that was reading in the low 80s. She reported this to her PCP who recommended that she come to the hospital for further treatment. EMS evaluated her confirm that she was in the low 80s and started on 2 L nasal cannula of supplemental oxygen which improved her saturations to 90%. The patient had been started on steroids and albuterol inhaler in the outpatient setting. There was concern due to her symptoms progressively worsening in the last 24 hours. The patient was seen in the emergency department and a repeat Covid test was ordered. Patient had a chest x-ray with findings of multifocal pneumonia with a viral appearance consistent with COVID-19 infection. She was maintained on 2 L nasal cannula oxygen. Have a lactic acid of 2.0. She had a normal WBC. Her inflammatory markers were only mildly elevated. Hospital admission was requested for further management.  12/24:Patient is overall doing well this morning and has improvement in creatinine levels with stable vital signs. She remains on 2 L nasal cannula oxygen and we will continue work on weaning.  12/25: Patient continues to do quite well without any worsening shortness of breath.  She still remains on 2 L nasal cannula oxygen.  She requires 2 more days of treatment on remdesivir.  Stressed  use of incentive spirometry today.  12/26: Patient has some subjective shortness of breath today and is currently on 3 L nasal cannula, but has been weaned down to 2 L at bedside.  Inflammatory markers are stable/downtrending.  We will plan to repeat chest x-ray today to ensure no changes.  Patient will complete remdesivir 5-day dosing on 12/27.  Assessment & Plan:   Principal Problem:   Pneumonia due to COVID-19 virus Active Problems:   Hypertension   Hyperlipidemia   Chronic GERD   History of closed head injury   Hypothyroidism due to acquired atrophy of thyroid   Chronic pain due to trauma   Controlled substance agreement signed   Acute respiratory failure with hypoxia ([place])   Hypokalemia   AKI (acute kidney injury) ([place])   1. Acute respiratory failure with hypoxia secondary to Covid pneumonia-the patient is going to be admitted for supportive therapy and treatment. Continue supplemental oxygen 2 L nasal cannulaand wean as tolerated.  Use incentive spirometer every 1 hour.  Chest x-ray for further evaluation today given some increased subjective shortness of breath. 2. Covid pneumonia-follow inflammatory markers, continue IV steroids as ordered, start remdesivir per pharmacy recommendations, prone as ordered. Gentle IV fluid hydration as ordered.Continue zinc and vitamin C.Continue supplemental oxygen as ordered and wean as tolerated. Follow inflammatory markers closely. 3. AKI-patient is mildly dehydrated and now resolved.  Plan to hold any further IV fluid. 4. GERD-Protonix ordered for GI protection. 5. Essential hypertension-monitor blood pressures closely, resume home medications and follow. 6. Hypokalemia-currently resolved. Recheck in a.m. 7. Chronic  pain-pain medications ordered as needed. 8. Hyperlipidemia-resume home pravastatin.   DVT prophylaxis:Lovenox Code Status:Full Family Communication:None at bedside Disposition Plan:Continue treatments for Covid  pneumonia through 12/27.  Wean oxygen as tolerated.  Anticipate discharge in next day with likely need for home oxygen.  Chest x-ray ordered for today for further evaluation.   Consultants:  None  Procedures:  None  Antimicrobials:  Anti-infectives (From admission, onward)   Start     Dose/Rate Route Frequency Ordered Stop   04/01/19 1000  remdesivir 100 mg in sodium chloride 0.9 % 100 mL IVPB  Status:  Discontinued     100 mg 200 mL/hr over 30 Minutes Intravenous Daily 03/31/19 1702 03/31/19 1707   04/01/19 1000  remdesivir 100 mg in sodium chloride 0.9 % 100 mL IVPB     100 mg 200 mL/hr over 30 Minutes Intravenous Daily 03/31/19 1211 04/05/19 0959   03/31/19 1702  remdesivir 200 mg in sodium chloride 0.9% 250 mL IVPB  Status:  Discontinued     200 mg 580 mL/hr over 30 Minutes Intravenous Once 03/31/19 1702 03/31/19 1707   03/31/19 1300  remdesivir 200 mg in sodium chloride 0.9% 250 mL IVPB     200 mg 580 mL/hr over 30 Minutes Intravenous Once 03/31/19 1211 03/31/19 1406       Subjective: Patient seen and evaluated today with increasing symptoms of shortness of breath noted today.  She still requires approximately 2-3 L nasal cannula oxygen.  No other acute overnight events noted.  Objective: Vitals:   04/02/19 2042 04/02/19 2100 04/03/19 0500 04/03/19 0829  BP:  (!) 141/65 108/61 (!) 123/40  Pulse: 75 77 73 72  Resp: 20 15 16    Temp:  97.7 F (36.5 C) 98.5 F (36.9 C)   TempSrc:  Oral Oral   SpO2: (!) 87% 93% 91% 98%  Weight:      Height:        Intake/Output Summary (Last 24 hours) at 04/03/2019 1020 Last data filed at 04/03/2019 0600 Gross per 24 hour  Intake 440 ml  Output --  Net 440 ml   Filed Weights   03/31/19 0956 03/31/19 1744  Weight: 86.2 kg 87.3 kg    Examination:  General exam: Appears calm and comfortable  Respiratory system: Clear to auscultation. Respiratory effort normal.  Currently on 2-3 L nasal cannula oxygen. Cardiovascular  system: S1 & S2 heard, RRR. No JVD, murmurs, rubs, gallops or clicks. No pedal edema. Gastrointestinal system: Abdomen is nondistended, soft and nontender. No organomegaly or masses felt. Normal bowel sounds heard. Central nervous system: Alert and oriented. No focal neurological deficits. Extremities: Symmetric 5 x 5 power. Skin: No rashes, lesions or ulcers Psychiatry: Judgement and insight appear normal. Mood & affect appropriate.     Data Reviewed: I have personally reviewed following labs and imaging studies  CBC: Recent Labs  Lab 03/31/19 1014 04/01/19 0831 04/02/19 0743 04/03/19 0750  WBC 5.9 4.2 5.6 8.6  NEUTROABS 5.2 3.5 4.5 6.6  HGB 14.9 13.8 13.8 14.0  HCT 45.2 43.1 44.1 44.0  MCV 88.6 90.0 91.3 91.5  PLT 118* 145* 175 [id number]   Basic Metabolic Panel: Recent Labs  Lab 03/31/19 1014 04/01/19 0623 04/02/19 0743 04/03/19 0750  NA 140 144 145 142  K 3.3* 4.4 4.1 3.7  CL 103 108 111 110  CO2 25 25 25 22   GLUCOSE 132* 142* 128* 96  BUN 26* 26* 28* 31*  CREATININE 1.16* 0.94 0.88 0.97  CALCIUM 9.1 8.7* 9.1  8.8*  MG  --  1.9 2.0 1.9  PHOS  --  1.8* 2.2* 2.2*   GFR: Estimated Creatinine Clearance: 54.2 mL/min (by C-G formula based on SCr of 0.97 mg/dL). Liver Function Tests: Recent Labs  Lab 03/31/19 1014 04/01/19 0623 04/02/19 0743 04/03/19 0750  AST 42* 46* 48* 42*  ALT 28 29 42 42  ALKPHOS 71 65 62 59  BILITOT 0.7 0.7 0.6 0.8  PROT 7.1 6.2* 6.1* 5.7*  ALBUMIN 3.8 3.3* 3.3* 3.2*   No results for input(s): LIPASE, AMYLASE in the last 168 hours. No results for input(s): AMMONIA in the last 168 hours. Coagulation Profile: No results for input(s): INR, PROTIME in the last 168 hours. Cardiac Enzymes: No results for input(s): CKTOTAL, CKMB, CKMBINDEX, TROPONINI in the last 168 hours. BNP (last 3 results) No results for input(s): PROBNP in the last 8760 hours. HbA1C: No results for input(s): HGBA1C in the last 72 hours. CBG: No results for input(s):  GLUCAP in the last 168 hours. Lipid Profile: No results for input(s): CHOL, HDL, LDLCALC, TRIG, CHOLHDL, LDLDIRECT in the last 72 hours. Thyroid Function Tests: No results for input(s): TSH, T4TOTAL, FREET4, T3FREE, THYROIDAB in the last 72 hours. Anemia Panel: Recent Labs    04/02/19 0743 04/03/19 0750  FERRITIN 772* 624*   Sepsis Labs: Recent Labs  Lab 03/31/19 1014 03/31/19 1315  PROCALCITON <0.10  --   LATICACIDVEN 2.0* 0.9    Recent Results (from the past 240 hour(s))  SARS CORONAVIRUS 2 (TAT 6-24 HRS) Nasopharyngeal Nasopharyngeal Swab     Status: Abnormal   Collection Time: 03/31/19  9:57 AM   Specimen: Nasopharyngeal Swab  Result Value Ref Range Status   SARS Coronavirus 2 POSITIVE (A) NEGATIVE Final    Comment: RESULT CALLED TO, READ BACK BY AND VERIFIED WITH: C.ROSE,RN [id number] 04/01/19 G.MCADOO (NOTE) SARS-CoV-2 target nucleic acids are DETECTED. The SARS-CoV-2 RNA is generally detectable in upper and lower respiratory specimens during the acute phase of infection. Positive results are indicative of the presence of SARS-CoV-2 RNA. Clinical correlation with patient history and other diagnostic information is  necessary to determine patient infection status. Positive results do not rule out bacterial infection or co-infection with other viruses.  The expected result is Negative. Fact Sheet for Patients: SugarRoll.be Fact Sheet for Healthcare Providers: https://www.woods-mathews.com/ This test is not yet approved or cleared by the [place] FDA and  has been authorized for detection and/or diagnosis of SARS-CoV-2 by FDA under an Emergency Use Authorization (EUA). This EUA will remain  in effect (meaning this test can be used) for the du ration of the COVID-19 declaration under Section 564(b)(1) of the Act, 21 U.S.C. section 360bbb-3(b)(1), unless the authorization is terminated or revoked sooner. Performed at [facility], [place] [address] [place], [place] [address]   Blood Culture (routine x 2)     Status: None (Preliminary result)   Collection Time: 03/31/19 10:54 AM   Specimen: BLOOD RIGHT HAND  Result Value Ref Range Status   Specimen Description BLOOD RIGHT HAND  Final   Special Requests   Final    BOTTLES DRAWN AEROBIC AND ANAEROBIC Blood Culture adequate volume   Culture   Final    NO GROWTH 3 DAYS Performed at [facility], [address], [place], [place] [address]    Report Status PENDING  Incomplete  Blood Culture (routine x 2)     Status: None (Preliminary result)   Collection Time: 03/31/19  1:15 PM  Specimen: BLOOD RIGHT FOREARM  Result Value Ref Range Status   Specimen Description BLOOD RIGHT FOREARM  Final   Special Requests   Final    BOTTLES DRAWN AEROBIC ONLY Blood Culture adequate volume   Culture   Final    NO GROWTH 3 DAYS Performed at [facility], [address] [place], [place] [address]    Report Status PENDING  Incomplete         Radiology Studies: No results found.      Scheduled Meds:  vitamin C  500 mg Oral Daily   calcium-vitamin D  1 tablet Oral Q breakfast   dexamethasone (DECADRON) injection  6 mg Intravenous Q24H   enoxaparin (LOVENOX) injection  40 mg Subcutaneous Q24H   fluticasone  2 spray Each Nare Daily   levothyroxine  100 mcg Oral Q0600   loratadine  10 mg Oral Daily   mouth rinse  15 mL Mouth Rinse BID   metoprolol tartrate  25 mg Oral Daily   pantoprazole  40 mg Oral BID   pravastatin  20 mg Oral q1800   Vitamin D (Ergocalciferol)  50,000 Units Oral Q7 days   zinc sulfate  220 mg Oral Daily   Continuous Infusions:  remdesivir 100 mg in NS 100 mL 100 mg (04/03/19 0848)     LOS: 3 days    Time spent: 30 minutes    Rayya Yagi Darleen Crocker, DO [facility] Pager [phone]  If 7PM-7AM, please contact night-coverage [email or web address] Password [email or web address] 04/03/2019, 10:20 AM

## 2019-04-04 DIAGNOSIS — E7800 Pure hypercholesterolemia, unspecified: Secondary | ICD-10-CM

## 2019-04-04 DIAGNOSIS — K219 Gastro-esophageal reflux disease without esophagitis: Secondary | ICD-10-CM

## 2019-04-04 DIAGNOSIS — J9601 Acute respiratory failure with hypoxia: Secondary | ICD-10-CM

## 2019-04-04 DIAGNOSIS — E034 Atrophy of thyroid (acquired): Secondary | ICD-10-CM

## 2019-04-04 DIAGNOSIS — N179 Acute kidney failure, unspecified: Secondary | ICD-10-CM

## 2019-04-04 DIAGNOSIS — J1289 Other viral pneumonia: Secondary | ICD-10-CM

## 2019-04-04 DIAGNOSIS — I10 Essential (primary) hypertension: Secondary | ICD-10-CM

## 2019-04-04 DIAGNOSIS — U071 COVID-19: Principal | ICD-10-CM

## 2019-04-04 LAB — CBC WITH DIFFERENTIAL/PLATELET
Abs Immature Granulocytes: 0.05 10*3/uL (ref 0.00–0.07)
Basophils Absolute: 0 10*3/uL (ref 0.0–0.1)
Basophils Relative: 0 %
Eosinophils Absolute: 0 10*3/uL (ref 0.0–0.5)
Eosinophils Relative: 0 %
HCT: 41.5 % (ref 36.0–46.0)
Hemoglobin: 13.3 g/dL (ref 12.0–15.0)
Immature Granulocytes: 1 %
Lymphocytes Relative: 13 %
Lymphs Abs: 0.8 10*3/uL (ref 0.7–4.0)
MCH: 28.4 pg (ref 26.0–34.0)
MCHC: 32 g/dL (ref 30.0–36.0)
MCV: 88.5 fL (ref 80.0–100.0)
Monocytes Absolute: 0.6 10*3/uL (ref 0.1–1.0)
Monocytes Relative: 10 %
Neutro Abs: 4.7 10*3/uL (ref 1.7–7.7)
Neutrophils Relative %: 76 %
Platelets: 186 10*3/uL (ref 150–400)
RBC: 4.69 MIL/uL (ref 3.87–5.11)
RDW: 12.7 % (ref 11.5–15.5)
WBC: 6.2 10*3/uL (ref 4.0–10.5)
nRBC: 0 % (ref 0.0–0.2)

## 2019-04-04 LAB — COMPREHENSIVE METABOLIC PANEL
ALT: 38 U/L (ref 0–44)
AST: 26 U/L (ref 15–41)
Albumin: 3.2 g/dL — ABNORMAL LOW (ref 3.5–5.0)
Alkaline Phosphatase: 56 U/L (ref 38–126)
Anion gap: 9 (ref 5–15)
BUN: 26 mg/dL — ABNORMAL HIGH (ref 8–23)
CO2: 24 mmol/L (ref 22–32)
Calcium: 9.2 mg/dL (ref 8.9–10.3)
Chloride: 111 mmol/L (ref 98–111)
Creatinine, Ser: 0.89 mg/dL (ref 0.44–1.00)
GFR calc Af Amer: >60 mL/min (ref >60)
GFR calc non Af Amer: >60 mL/min (ref >60)
Glucose, Bld: 103 mg/dL — ABNORMAL HIGH (ref 70–99)
Potassium: 3.7 mmol/L (ref 3.5–5.1)
Sodium: 144 mmol/L (ref 135–145)
Total Bilirubin: 1 mg/dL (ref 0.3–1.2)
Total Protein: 5.8 g/dL — ABNORMAL LOW (ref 6.5–8.1)

## 2019-04-04 LAB — D-DIMER, QUANTITATIVE: D-Dimer, Quant: 0.32 ug/mL-FEU (ref 0.00–0.50)

## 2019-04-04 LAB — MAGNESIUM: Magnesium: 1.9 mg/dL (ref 1.7–2.4)

## 2019-04-04 LAB — C-REACTIVE PROTEIN: CRP: 5.7 mg/dL — ABNORMAL HIGH (ref <1.0)

## 2019-04-04 LAB — FERRITIN: Ferritin: 599 ng/mL — ABNORMAL HIGH (ref 11–307)

## 2019-04-04 LAB — PHOSPHORUS: Phosphorus: 2.6 mg/dL (ref 2.5–4.6)

## 2019-04-04 MED ORDER — ZINC SULFATE 220 (50 ZN) MG PO CAPS: 220.0000 mg | ORAL_CAPSULE | Freq: Every day | ORAL | 1 refills | Status: DC

## 2019-04-04 MED ORDER — ASCORBIC ACID 500 MG PO TABS: 500.0000 mg | ORAL_TABLET | Freq: Every day | ORAL | 1 refills | Status: DC

## 2019-04-04 MED ORDER — TRIAMTERENE-HCTZ 37.5-25 MG PO CAPS: 1.0000 | ORAL_CAPSULE | Freq: Every day | ORAL | Status: DC

## 2019-04-04 MED ORDER — ACETAMINOPHEN 325 MG PO TABS: 650.0000 mg | ORAL_TABLET | Freq: Four times a day (QID) | ORAL | 0 refills | Status: DC | PRN

## 2019-04-04 MED ORDER — PREDNISONE 20 MG PO TABS: ORAL_TABLET | ORAL | 0 refills | Status: DC

## 2019-04-04 NOTE — TOC Transition Note (Signed)
Transition of Care [facility]) - CM/SW Discharge Note   Patient Details  Name: Selena Hunt MRN: [id number] Date of Birth: June 11, 1950  [facility] CM/SW Contact:  Ross Ludwig, LCSW Phone Number: 04/04/2019, 5:58 PM   Clinical Narrative:    CSW received consult that patient will need oxygen to go home with.  CSW spoke to [facility], they can deliver the oxygen to patient's room today before she discharges.  CSW updated bedside nurse, physician, and bedside nurse that patient will have oxygen delivered today before she is able to discharge back home.  Patient did not express any other issues or concerns, patient plans to return back home tonight once oxygen has been delivered.   Final next level of care: Home/Self Care Barriers to Discharge: Barriers Resolved   Patient Goals and CMS Choice Patient states their goals for this hospitalization and ongoing recovery are:: To return back home with oxygen CMS Medicare.gov Compare Post Acute Care list provided to:: Patient Choice offered to / list presented to : Patient  Discharge Placement  Patient will be discharging back home with home oxygen.                     Discharge Plan and Services  Patient will be going home with oxygen through [employer].              DME Arranged: Oxygen DME Agency: [employer] Date DME Agency Contacted: 04/04/19 Time DME Agency Contacted: [phone]   HH Arranged: NA          Social Determinants of Health (SDOH) Interventions     Readmission Risk Interventions No flowsheet data found.

## 2019-04-04 NOTE — Progress Notes (Signed)
SATURATION QUALIFICATIONS: (This note is used to comply with regulatory documentation for home oxygen)  Patient Saturations on Room Air at Rest = 93%  Patient Saturations on Room Air while Ambulating = 88%  Patient Saturations on 2 Liters of oxygen while Ambulating = 94%  Please briefly explain why patient needs home oxygen: SOB with exertion

## 2019-04-04 NOTE — Progress Notes (Signed)
IV removed. D/C instructions, including isolation precautions, reviewed with patient. Verbalized understanding. O2 to be delivered, patient made daughter aware. Will be transported to private vehicle via wheelchair after O2 tank is delivered. Will continue to monitor.

## 2019-04-04 NOTE — Discharge Summary (Signed)
Physician Discharge Summary  Selena Hunt [id number] DOB: Jan 05, 1951 DOA: 03/31/2019  PCP: Baruch Gouty, FNP  Admit date: 03/31/2019 Discharge date: 04/04/2019  Time spent: 35 minutes  Recommendations for Outpatient Follow-up:  1. Reassess blood pressure and further adjust antihypertensive regimen as required 2. Repeat basic metabolic panel to follow lites and renal function 3. Repeat chest x-ray at 8 weeks to assure complete resolution of infiltrates 4. Reassess and wean off oxygen supplementation as tolerated.   Discharge Diagnoses:  Principal Problem:   Pneumonia due to COVID-19 virus Active Problems:   Hypertension   Hyperlipidemia   Chronic GERD   History of closed head injury   Hypothyroidism due to acquired atrophy of thyroid   Chronic pain due to trauma   Controlled substance agreement signed   Acute respiratory failure with hypoxia ([place])   Hypokalemia   AKI (acute kidney injury) ([place])   Discharge Condition: Stable and improved.  Patient discharged home with instruction to follow-up with PCP in 10 days.  Diet recommendation: Heart healthy diet.  Filed Weights   03/31/19 0956 03/31/19 1744  Weight: 86.2 kg 87.3 kg    History of present illness:  As per H&P written by Dr. Wynetta Emery on 03/31/2019 Zollie Pee Martinis a 68 y.o.femalewho was recently diagnosed with COVID-19 by her PCP a couple of days ago presented with increasing shortness of breath cough and chest congestion. She had one episode of vomiting. She also having some myalgias and skin sensitivity. Her main complaint is shortness of breath. She does have a history of a prior lung injury after major trauma. She reports no fever but reports that her shortness of breath has only progressed over the past several days. She had a home pulse ox monitor that was reading in the low 80s. She reported this to her PCP who recommended that she come to the hospital for further treatment. EMS evaluated her  confirm that she was in the low 80s and started on 2 L nasal cannula of supplemental oxygen which improved her saturations to 90%. The patient had been started on steroids and albuterol inhaler in the outpatient setting. There was concern due to her symptoms progressively worsening in the last 24 hours. The patient was seen in the emergency department and a repeat Covid test was ordered. Patient had a chest x-ray with findings of multifocal pneumonia with a viral appearance consistent with COVID-19 infection. She was maintained on 2 L nasal cannula oxygen. Have a lactic acid of 2.0. She had a normal WBC. Her inflammatory markers were only mildly elevated. Hospital admission was requested for further management.  Hospital Course:  1-acute respiratory failure with hypoxia secondary to COVID-19 pneumonia -Patient completed 5 days of remdesivir -Will be discharged on as needed antitussives and steroids tapering -Continue instruction for proning and use of incentive spirometer -Continue using oxygen supplementation mainly on exertion and is slowly wean off as tolerated. -Follow-up with PCP in 10 days. -Continue as needed bronchodilators  2-acute kidney injury -In the setting of dehydration and continue use of nephrotoxic agents -Resolved after fluid resuscitation and holding diuretics. -Safe to resume diuretics 70 days after discharge. -Patient advised to maintain adequate hydration.  3-gastroesophageal disease -Continue PPI  4-essential hypertension -Overall stable and well-controlled -As mentioned above resumption of antihypertensive agents on 04/06/2019 -Heart healthy diet encouraged.  5-hypokalemia -Resolved after repletion -Repeat basic metabolic panel follow-up visit to reassess electrolytes trend.  6-chronic pain syndrome -Continue outpatient follow-up with PCP -Pain contract in place -No narcotics  prescribed -Continue home as needed pain  meds.  7-hyperlipidemia -Continue Pravachol.  Procedures:  See below for x-ray reports.  Consultations:  None   Discharge Exam: Vitals:   04/04/19 0554 04/04/19 1423  BP: 120/77 127/67  Pulse: 81 65  Resp: 17 18  Temp: (!) 97.3 F (36.3 C) 97.6 F (36.4 C)  SpO2: 93% 94%    General: Afebrile, no chest pain, no nausea, no vomiting.  Still short of breath on exertion and requiring 2 L nasal cannula supplementation; but otherwise feeling okay and ready to go home. Cardiovascular: S1-S2, no rubs, no gallops, no JVD Respiratory: Improved air movement bilaterally, no wheezing, no using accessory muscles.  Positive scattered rhonchi.  Able to speak in full sentences; normal respiratory effort. Abdomen: Soft, nontender, nondistended, positive bowel sounds. Extremities: No cyanosis, no clubbing.  Discharge Instructions   Discharge Instructions    Discharge instructions   Complete by: As directed    Take medications as prescribed Maintain adequate hydration Arrange follow-up with PCP in 10 days Use oxygen supplementation as instructed (mainly on exertion, but as needed at rest as well; 2 L nasal cannula supplementation). Follow heart healthy diet   Increase activity slowly   Complete by: As directed      Allergies as of 04/04/2019      Reactions   Ambien [zolpidem Tartrate] Anxiety   Diovan [valsartan] Other (See Comments)   Seizures.   Wellbutrin [bupropion] Other (See Comments)   Seizures.   Codeine    Feel crazy and dizzy    Feldene [piroxicam]    Swelling    Guaifenesin Er    Raise Bp / HR    Latex    Swelling    Mobic [meloxicam] Other (See Comments)   Stomach cramps   Neurontin [gabapentin]    "loopy"   Crestor [rosuvastatin Calcium] Other (See Comments)   Myalgias   Lipitor [atorvastatin Calcium] Other (See Comments)   myalgias   Livalo [pitavastatin] Swelling, Other (See Comments)   stomach cramping   Simcor [niacin-simvastatin Er] Other (See  Comments)   Stomach cramps   Zocor [simvastatin] Other (See Comments)   Stomach cramps      Medication List    STOP taking these medications   dexamethasone 6 MG tablet Commonly known as: DECADRON     TAKE these medications   acetaminophen 325 MG tablet Commonly known as: TYLENOL Take 2 tablets (650 mg total) by mouth every 6 (six) hours as needed for mild pain or headache (fever >/= 101).   albuterol 108 (90 Base) MCG/ACT inhaler Commonly known as: VENTOLIN HFA Inhale 2 puffs into the lungs every 6 (six) hours as needed for wheezing or shortness of breath.   ascorbic acid 500 MG tablet Commonly known as: VITAMIN C Take 1 tablet (500 mg total) by mouth daily. Start taking on: April 05, 2019   benzonatate 200 MG capsule Commonly known as: TESSALON Take 1 capsule (200 mg total) by mouth 3 (three) times daily as needed. Notes to patient: For cough   calcium-vitamin D 500-200 MG-UNIT tablet Commonly known as: OSCAL WITH D Take 1 tablet by mouth daily with breakfast.   esomeprazole 40 MG capsule Commonly known as: NexIUM Take 1 capsule (40 mg total) by mouth 2 (two) times daily before a meal.   fluticasone 50 MCG/ACT nasal spray Commonly known as: FLONASE Place 2 sprays into both nostrils daily.   loratadine 10 MG tablet Commonly known as: CLARITIN Take 1 tablet (10 mg total)  by mouth daily.   meclizine 25 MG tablet Commonly known as: ANTIVERT Take 1 tablet (25 mg total) by mouth every 8 (eight) hours as needed for dizziness.   metoprolol tartrate 50 MG tablet Commonly known as: LOPRESSOR Take 0.5 tablets (25 mg total) by mouth daily.   pravastatin 20 MG tablet Commonly known as: PRAVACHOL TAKE 1 TABLET BY MOUTH EVERY DAY   predniSONE 20 MG tablet Commonly known as: Deltasone Take 3 tablets by mouth daily x2 days; then 2 tablets by mouth daily x2 days; then 1 tablet by mouth daily x3 days; then half tablet by mouth daily x3 days and stop prednisone.    Synthroid 100 MCG tablet Generic drug: levothyroxine TAKE 1 TABLET BY MOUTH EVERY DAY EXCEPT MONDAY & FRIDAY TAKE 1/2 TABLET   triamterene-hydrochlorothiazide 37.5-25 MG capsule Commonly known as: DYAZIDE Take 1 each (1 capsule total) by mouth daily. Start taking on: April 06, 2019 What changed: These instructions start on April 06, 2019. If you are unsure what to do until then, ask your doctor or other care provider.   Vitamin D (Ergocalciferol) 1.25 MG (50000 UT) Caps capsule Commonly known as: DRISDOL TAKE 1 CAPSULE BY MOUTH EVERY 7 DAYS   WOMENS 50+ ADVANCED PO Take by mouth.   zinc sulfate 220 (50 Zn) MG capsule Take 1 capsule (220 mg total) by mouth daily. Start taking on: April 05, 2019            Durable Medical Equipment  (From admission, onward)         Start     Ordered   04/04/19 1352  For home use only DME oxygen  Once    Question Answer Comment  Length of Need 6 Months   Mode or (Route) Nasal cannula   Liters per Minute 2   Frequency Continuous (stationary and portable oxygen unit needed)   Oxygen conserving device Yes   Oxygen delivery system Gas      04/04/19 1351         Allergies  Allergen Reactions  . Ambien [Zolpidem Tartrate] Anxiety  . Diovan [Valsartan] Other (See Comments)    Seizures.  . Wellbutrin [Bupropion] Other (See Comments)    Seizures.  . Codeine     Feel crazy and dizzy   . Feldene [Piroxicam]     Swelling   . Guaifenesin Er     Raise Bp / HR   . Latex     Swelling   . Mobic [Meloxicam] Other (See Comments)    Stomach cramps  . Neurontin [Gabapentin]     "loopy"  . Crestor [Rosuvastatin Calcium] Other (See Comments)    Myalgias   . Lipitor [Atorvastatin Calcium] Other (See Comments)    myalgias  . Livalo [Pitavastatin] Swelling and Other (See Comments)    stomach cramping   . Simcor [Niacin-Simvastatin Er] Other (See Comments)    Stomach cramps  . Zocor [Simvastatin] Other (See Comments)     Stomach cramps   Follow-up Information    Rakes, Connye Burkitt, FNP. Schedule an appointment as soon as possible for a visit in 10 day(s).   Specialty: Family Medicine Contact information: [place] [address] [phone]           The results of significant diagnostics from this hospitalization (including imaging, microbiology, ancillary and laboratory) are listed below for reference.    Significant Diagnostic Studies: DG CHEST PORT 1 VIEW  Result Date: 04/03/2019 CLINICAL DATA:  Hypoxemia at, history  COVID-19, hypertension EXAM: PORTABLE CHEST 1 VIEW COMPARISON:  Portable exam 1312 hours compared to 04/01/2019 FINDINGS: Normal heart size mediastinal contours. Decreased lung volumes with increased LEFT lung infiltrate consistent with pneumonia. Mild persistent patchy infiltrate RIGHT upper lobe. No pleural effusion or pneumothorax. Osseous structures unremarkable. IMPRESSION: Multifocal pneumonia, LEFT greater than RIGHT, increased on LEFT since 04/01/2019. Electronically Signed   By: Lavonia Dana M.D.   On: 04/03/2019 14:05   Portable chest 1 View  Result Date: 04/01/2019 CLINICAL DATA:  COVID pneumonia. EXAM: PORTABLE CHEST 1 VIEW COMPARISON:  03/31/2019 FINDINGS: The cardiac silhouette, mediastinal and hilar contours are within normal limits. Persistent patchy bilateral infiltrates but improved aeration, particularly in the left lower lobe. No pleural effusions. IMPRESSION: Persistent but improved bilateral infiltrates. Electronically Signed   By: Marijo Sanes M.D.   On: 04/01/2019 05:21   DG Chest Port 1 View  Result Date: 03/31/2019 CLINICAL DATA:  Cough, shortness of breath, recent COVID positive test EXAM: PORTABLE CHEST 1 VIEW COMPARISON:  2019 FINDINGS: Low lung volumes reflecting shallow inspiration. Patchy opacities, left greater than right. No significant pleural effusion. No pneumothorax. Cardiomediastinal contours are likely within normal limits for  technique. IMPRESSION: Patchy pulmonary opacities, left greater than right, suspicious for COVID 19 pneumonia. Electronically Signed   By: Macy Mis M.D.   On: 03/31/2019 10:26   Microbiology: Recent Results (from the past 240 hour(s))  SARS CORONAVIRUS 2 (TAT 6-24 HRS) Nasopharyngeal Nasopharyngeal Swab     Status: Abnormal   Collection Time: 03/31/19  9:57 AM   Specimen: Nasopharyngeal Swab  Result Value Ref Range Status   SARS Coronavirus 2 POSITIVE (A) NEGATIVE Final    Comment: RESULT CALLED TO, READ BACK BY AND VERIFIED WITH: C.ROSE,RN [id number] 04/01/19 G.MCADOO (NOTE) SARS-CoV-2 target nucleic acids are DETECTED. The SARS-CoV-2 RNA is generally detectable in upper and lower respiratory specimens during the acute phase of infection. Positive results are indicative of the presence of SARS-CoV-2 RNA. Clinical correlation with patient history and other diagnostic information is  necessary to determine patient infection status. Positive results do not rule out bacterial infection or co-infection with other viruses.  The expected result is Negative. Fact Sheet for Patients: SugarRoll.be Fact Sheet for Healthcare Providers: https://www.woods-mathews.com/ This test is not yet approved or cleared by the [place] FDA and  has been authorized for detection and/or diagnosis of SARS-CoV-2 by FDA under an Emergency Use Authorization (EUA). This EUA will remain  in effect (meaning this test can be used) for the du ration of the COVID-19 declaration under Section 564(b)(1) of the Act, 21 U.S.C. section 360bbb-3(b)(1), unless the authorization is terminated or revoked sooner. Performed at [facility], [place] [address] [place], [place] [address]   Blood Culture (routine x 2)     Status: None (Preliminary result)   Collection Time: 03/31/19 10:54 AM   Specimen: BLOOD RIGHT HAND  Result Value Ref Range Status   Specimen Description BLOOD  RIGHT HAND  Final   Special Requests   Final    BOTTLES DRAWN AEROBIC AND ANAEROBIC Blood Culture adequate volume   Culture   Final    NO GROWTH 4 DAYS Performed at [facility], [address], [place], [place] [address]    Report Status PENDING  Incomplete  Blood Culture (routine x 2)     Status: None (Preliminary result)   Collection Time: 03/31/19  1:15 PM   Specimen: BLOOD RIGHT FOREARM  Result Value Ref Range Status   Specimen  Description BLOOD RIGHT FOREARM  Final   Special Requests   Final    BOTTLES DRAWN AEROBIC ONLY Blood Culture adequate volume   Culture   Final    NO GROWTH 4 DAYS Performed at [facility], [address] [place], [place] [address]    Report Status PENDING  Incomplete     Labs: Basic Metabolic Panel: Recent Labs  Lab 03/31/19 1014 04/01/19 0623 04/02/19 0743 04/03/19 0750 04/04/19 0757  NA 140 144 145 142 144  K 3.3* 4.4 4.1 3.7 3.7  CL 103 108 111 110 111  CO2 25 25 25 22 24   GLUCOSE 132* 142* 128* 96 103*  BUN 26* 26* 28* 31* 26*  CREATININE 1.16* 0.94 0.88 0.97 0.89  CALCIUM 9.1 8.7* 9.1 8.8* 9.2  MG  --  1.9 2.0 1.9 1.9  PHOS  --  1.8* 2.2* 2.2* 2.6   Liver Function Tests: Recent Labs  Lab 03/31/19 1014 04/01/19 0623 04/02/19 0743 04/03/19 0750 04/04/19 0757  AST 42* 46* 48* 42* 26  ALT 28 29 42 42 38  ALKPHOS 71 65 62 59 56  BILITOT 0.7 0.7 0.6 0.8 1.0  PROT 7.1 6.2* 6.1* 5.7* 5.8*  ALBUMIN 3.8 3.3* 3.3* 3.2* 3.2*   CBC: Recent Labs  Lab 03/31/19 1014 04/01/19 0831 04/02/19 0743 04/03/19 0750 04/04/19 0757  WBC 5.9 4.2 5.6 8.6 6.2  NEUTROABS 5.2 3.5 4.5 6.6 4.7  HGB 14.9 13.8 13.8 14.0 13.3  HCT 45.2 43.1 44.1 44.0 41.5  MCV 88.6 90.0 91.3 91.5 88.5  PLT 118* 145* 175 167 186   BNP (last 3 results) Recent Labs    03/31/19 1014  BNP 70.0    Signed:  Barton Dubois MD.  [facility] 04/04/2019, 3:52 PM

## 2019-04-05 ENCOUNTER — Encounter (INDEPENDENT_AMBULATORY_CARE_PROVIDER_SITE_OTHER): Payer: Self-pay

## 2019-04-05 ENCOUNTER — Telehealth: Payer: Self-pay | Admitting: Family Medicine

## 2019-04-05 LAB — CULTURE, BLOOD (ROUTINE X 2)
Culture: NO GROWTH
Culture: NO GROWTH
Special Requests: ADEQUATE
Special Requests: ADEQUATE

## 2019-04-05 NOTE — Telephone Encounter (Signed)
Son aware and verbalizes understanding.  

## 2019-04-05 NOTE — Telephone Encounter (Signed)
Pt's son called stating that pt has been in the hospital due to [place] and was released last night. Requested to speak with Dr Thayer Ohm or nurse regarding patient.

## 2019-04-05 NOTE — Telephone Encounter (Signed)
Patient was in [place] for COVID induced pneumonia from 12/23-12/27.  States patient was given the 5 day anti viral treatment in the hospital. Patient is on oxygen at home and also was sent home with the prednisone to take.  She is worried about the pneumonia and would like to know if she needs to be on a abx? Please advise

## 2019-04-05 NOTE — Telephone Encounter (Signed)
No. It is pneumonia due to COVID-19, which is viral. Antibiotics treat bacterial pneumonia not viral pneumonia.

## 2019-04-05 NOTE — Telephone Encounter (Signed)
Needs to speak with triage nurse to schedule hospital follow up visit.

## 2019-04-06 ENCOUNTER — Encounter (INDEPENDENT_AMBULATORY_CARE_PROVIDER_SITE_OTHER): Payer: Self-pay

## 2019-04-06 NOTE — Telephone Encounter (Signed)
Patient has a follow up appointment scheduled. 

## 2019-04-07 ENCOUNTER — Encounter (INDEPENDENT_AMBULATORY_CARE_PROVIDER_SITE_OTHER): Payer: Self-pay

## 2019-04-08 ENCOUNTER — Encounter (INDEPENDENT_AMBULATORY_CARE_PROVIDER_SITE_OTHER): Payer: Self-pay

## 2019-04-09 ENCOUNTER — Encounter (INDEPENDENT_AMBULATORY_CARE_PROVIDER_SITE_OTHER): Payer: Self-pay

## 2019-04-10 ENCOUNTER — Encounter (INDEPENDENT_AMBULATORY_CARE_PROVIDER_SITE_OTHER): Payer: Self-pay

## 2019-04-11 ENCOUNTER — Encounter (INDEPENDENT_AMBULATORY_CARE_PROVIDER_SITE_OTHER): Payer: Self-pay

## 2019-04-12 ENCOUNTER — Encounter (INDEPENDENT_AMBULATORY_CARE_PROVIDER_SITE_OTHER): Payer: Self-pay

## 2019-04-12 ENCOUNTER — Ambulatory Visit: Payer: Medicare Other | Admitting: Family Medicine

## 2019-04-16 ENCOUNTER — Telehealth: Payer: Self-pay | Admitting: Family Medicine

## 2019-04-16 ENCOUNTER — Encounter: Payer: Self-pay | Admitting: Family Medicine

## 2019-04-16 ENCOUNTER — Ambulatory Visit (INDEPENDENT_AMBULATORY_CARE_PROVIDER_SITE_OTHER): Payer: Medicare Other | Admitting: Family Medicine

## 2019-04-16 DIAGNOSIS — Z09 Encounter for follow-up examination after completed treatment for conditions other than malignant neoplasm: Secondary | ICD-10-CM

## 2019-04-16 DIAGNOSIS — Z8616 Personal history of COVID-19: Secondary | ICD-10-CM | POA: Diagnosis not present

## 2019-04-16 DIAGNOSIS — G8921 Chronic pain due to trauma: Secondary | ICD-10-CM

## 2019-04-16 DIAGNOSIS — J9601 Acute respiratory failure with hypoxia: Secondary | ICD-10-CM

## 2019-04-16 DIAGNOSIS — U071 COVID-19: Secondary | ICD-10-CM

## 2019-04-16 DIAGNOSIS — J1282 Pneumonia due to coronavirus disease 2019: Secondary | ICD-10-CM

## 2019-04-16 MED ORDER — PREDNISONE 20 MG PO TABS: ORAL_TABLET | ORAL | 0 refills | Status: DC

## 2019-04-16 MED ORDER — HYDROCODONE-ACETAMINOPHEN 5-325 MG PO TABS: 1.0000 | ORAL_TABLET | Freq: Every day | ORAL | 0 refills | Status: DC | PRN

## 2019-04-16 MED ORDER — ALBUTEROL SULFATE (2.5 MG/3ML) 0.083% IN NEBU: 2.5000 mg | INHALATION_SOLUTION | Freq: Four times a day (QID) | RESPIRATORY_TRACT | 1 refills | Status: DC | PRN

## 2019-04-16 MED ORDER — ALBUTEROL SULFATE HFA 108 (90 BASE) MCG/ACT IN AERS: 2.0000 | INHALATION_SPRAY | Freq: Four times a day (QID) | RESPIRATORY_TRACT | 0 refills | Status: DC | PRN

## 2019-04-16 NOTE — Telephone Encounter (Signed)
Aware both rx sent.

## 2019-04-16 NOTE — Telephone Encounter (Signed)
Did you do a rx for Nebulizer machine? Hydrocodone?
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
## 2019-04-16 NOTE — Telephone Encounter (Signed)
I did

## 2019-04-19 ENCOUNTER — Telehealth: Payer: Self-pay | Admitting: Family Medicine

## 2019-04-19 NOTE — Telephone Encounter (Signed)
Patient has an appt on 4/25.  Daughter aware

## 2019-04-19 NOTE — Addendum Note (Signed)
Addended by: Baruch Gouty on: 04/19/2019 09:03 AM   Modules accepted: Orders

## 2019-04-19 NOTE — Telephone Encounter (Signed)
I just did a phone visit with her, is she worse?

## 2019-04-30 ENCOUNTER — Other Ambulatory Visit: Payer: Self-pay

## 2019-05-03 ENCOUNTER — Encounter: Payer: Self-pay | Admitting: Family Medicine

## 2019-05-03 ENCOUNTER — Ambulatory Visit (INDEPENDENT_AMBULATORY_CARE_PROVIDER_SITE_OTHER): Payer: Medicare Other | Admitting: Family Medicine

## 2019-05-03 ENCOUNTER — Ambulatory Visit (INDEPENDENT_AMBULATORY_CARE_PROVIDER_SITE_OTHER): Payer: Medicare Other

## 2019-05-03 VITALS — BP 101/64 | HR 74 | Temp 98.6°F | Resp 20 | Ht 61.0 in | Wt 194.0 lb

## 2019-05-03 DIAGNOSIS — J9601 Acute respiratory failure with hypoxia: Secondary | ICD-10-CM

## 2019-05-03 DIAGNOSIS — R7989 Other specified abnormal findings of blood chemistry: Secondary | ICD-10-CM

## 2019-05-03 DIAGNOSIS — N179 Acute kidney failure, unspecified: Secondary | ICD-10-CM | POA: Diagnosis not present

## 2019-05-03 DIAGNOSIS — J1282 Pneumonia due to coronavirus disease 2019: Secondary | ICD-10-CM | POA: Diagnosis not present

## 2019-05-03 DIAGNOSIS — U071 COVID-19: Secondary | ICD-10-CM | POA: Diagnosis not present

## 2019-05-03 DIAGNOSIS — Z09 Encounter for follow-up examination after completed treatment for conditions other than malignant neoplasm: Secondary | ICD-10-CM | POA: Diagnosis not present

## 2019-05-03 DIAGNOSIS — E7800 Pure hypercholesterolemia, unspecified: Secondary | ICD-10-CM

## 2019-05-03 DIAGNOSIS — I10 Essential (primary) hypertension: Secondary | ICD-10-CM

## 2019-05-03 DIAGNOSIS — E034 Atrophy of thyroid (acquired): Secondary | ICD-10-CM

## 2019-05-03 DIAGNOSIS — E559 Vitamin D deficiency, unspecified: Secondary | ICD-10-CM

## 2019-05-03 HISTORY — DX: Other specified abnormal findings of blood chemistry: R79.89

## 2019-05-03 MED ORDER — PREDNISONE 20 MG PO TABS: ORAL_TABLET | ORAL | 0 refills | Status: DC

## 2019-05-03 NOTE — Progress Notes (Signed)
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 Absolute 0.0 0.0 - 0.5 K/uL   Basophils Relative 0 %   Basophils Absolute 0.0 0.0 - 0.1 K/uL   Immature Granulocytes 1 %   Abs Immature Granulocytes 0.05 0.00 - 0.07 K/uL  Comprehensive metabolic panel  Result Value Ref Range   Sodium 144 135 - 145 mmol/L   Potassium 3.7 3.5 - 5.1 mmol/L   Chloride 111 98 - 111 mmol/L   CO2 24 22 - 32 mmol/L   Glucose, Bld 103 (H) 70 - 99 mg/dL   BUN 26 (H) 8 - 23 mg/dL   Creatinine, Ser 0.89 0.44 - 1.00 mg/dL   Calcium 9.2 8.9 - 10.3 mg/dL   Total Protein 5.8 (L) 6.5 - 8.1 g/dL   Albumin 3.2 (L) 3.5 - 5.0 g/dL   AST 26 15 - 41 U/L   ALT 38 0 - 44 U/L    Alkaline Phosphatase 56 38 - 126 U/L   Total Bilirubin 1.0 0.3 - 1.2 mg/dL   GFR calc non Af Amer >60 >60 mL/min   GFR calc Af Amer >60 >60 mL/min   Anion gap 9 5 - 15  C-reactive protein  Result Value Ref Range   CRP 5.7 (H) <1.0 mg/dL  D-dimer, quantitative (not at [facility])  Result Value Ref Range   D-Dimer, Quant 0.32 0.00 - 0.50 ug/mL-FEU  Ferritin  Result Value Ref Range   Ferritin 599 (H) 11 - 307 ng/mL  Magnesium  Result Value Ref Range   Magnesium 1.9 1.7 - 2.4 mg/dL  Phosphorus  Result Value Ref Range   Phosphorus 2.6 2.5 - 4.6 mg/dL  ABO/Rh  Result Value Ref Range   ABO/RH(D)      O POS Performed at [facility], [address], [place], [place] [address]   Troponin I (High Sensitivity)  Result Value Ref Range  Troponin I (High Sensitivity) 6 <18 ng/L     X-Ray: CXR: Improvement from previous imaging pt recovering from COVID-19 pneumonia, no new or concerning consolidations. Preliminary x-ray reading by Monia Pouch, FNP-C, [facility].   Pertinent labs & imaging results that were available during my care of the patient were reviewed by me and considered in my medical decision making.  Assessment & Plan:  Selena Hunt was seen today for medical management of chronic issues, hypertension and hypothyroidism.  Diagnoses and all orders for this visit:  Hospital discharge follow up Pt doing well since being home. Still requires supplemental oxygen. Will continue at this time. Will check labs and repeat CXR today.   AKI (acute kidney injury) ([place]) Denies anuria or decreased urination. Will recheck labs today.  -     CBC with Differential/Platelet -     CMP14+EGFR  Acute respiratory failure with hypoxia (HCC) Pt still requiring supplemental oxygen at home as oxygen saturations are dropping into the low 80's with activity. Oxygen saturations return to mid 90's with oxygen and rest. Will continue supplemental oxygen at this time. Labs pending. CXR greatly improved since last imaging  completed.  -     CBC with Differential/Platelet -     DG Chest 2 View; Future -     predniSONE (DELTASONE) 20 MG tablet; 2 po at sametime daily for 5 days  Pneumonia due to COVID-19 virus CXR has greatly improved. Pt still requiring home O2 to keep oxygen saturations above 90%. Will continue therapy at this time. Will burst with steroids due to ongoing congestion. Continue albuterol as prescribed. Report any new or worsening symptoms. Will reevaluate in 4 weeks or sooner if needed.  -     DG Chest 2 View; Future -     predniSONE (DELTASONE) 20 MG tablet; 2 po at sametime daily for 5 days  Elevated ferritin Ferritin was elevated in hospital. Will recheck today.  -     Ferritin  Morbid obesity (HCC) Diet and exercise encouraged. Try to stay as active as possible without over exerting self.   Vitamin D deficiency Labs pending. Continue repletion therapy. If indicated, will change repletion dosage. Eat foods rich in Vit D including milk, orange juice, yogurt with vitamin D added, salmon or mackerel, canned tuna fish, cereals with vitamin D added, and cod liver oil. Get out in the sun but make sure to wear at least SPF 30 sunscreen.  -     VITAMIN D 25 Hydroxy (Vit-D Deficiency, Fractures)  Pure hypercholesterolemia Diet encouraged - increase intake of fresh fruits and vegetables, increase intake of lean proteins. Bake, broil, or grill foods. Avoid fried, greasy, and fatty foods. Avoid fast foods. Increase intake of fiber-rich whole grains. Exercise encouraged - at least 150 minutes per week and advance as tolerated.  Goal BMI < 25. Continue medications as prescribed. Follow up in 3-6 months as discussed.  -     Lipid panel  Hypertension BP well controlled. Changes were not made in regimen today. Goal BP is 130/80. Pt aware to report any persistent high or low readings. DASH diet and exercise encouraged. Exercise at least 150 minutes per week and increase as tolerated. Goal BMI > 25. Stress  management encouraged. Avoid nicotine and tobacco product use. Avoid excessive alcohol and NSAID's. Avoid more than 2000 mg of sodium daily. Medications as prescribed. Follow up as scheduled.  -     CBC with Differential/Platelet -     CMP14+EGFR -  Thyroid Panel With TSH  Hypothyroidism due to acquired atrophy of thyroid Thyroid disease has been well controlled. Labs are pending. Adjustments to regimen will be made if warranted. Make sure to take medications on an empty stomach with a full glass of water. Make sure to avoid vitamins or supplements for at least 4 hours before and 4 hours after taking medications. Repeat labs in 3 months if adjustments are made and in 6 months if stable.   -     Thyroid Panel With TSH     Continue all other maintenance medications.  Follow up plan: Return in about 4 weeks (around 05/31/2019), or if symptoms worsen or fail to improve, for Respiratory .  Continue healthy lifestyle choices, including diet (rich in fruits, vegetables, and lean proteins, and low in salt and simple carbohydrates) and exercise (at least 30 minutes of moderate physical activity daily).  Educational handout given for COVID-19  The above assessment and management plan was discussed with the patient. The patient verbalized understanding of and has agreed to the management plan. Patient is aware to call the clinic if they develop any new symptoms or if symptoms persist or worsen. Patient is aware when to return to the clinic for a follow-up visit. Patient educated on when it is appropriate to go to the emergency department.   Monia Pouch, FNP-C [facility] [phone]

## 2019-05-03 NOTE — Patient Instructions (Signed)
COVID-19 COVID-19 is a respiratory infection that is caused by a virus called severe acute respiratory syndrome coronavirus 2 (SARS-CoV-2). The disease is also known as coronavirus disease or novel coronavirus. In some people, the virus may not cause any symptoms. In others, it may cause a serious infection. The infection can get worse quickly and can lead to complications, such as:  Pneumonia, or infection of the lungs.  Acute respiratory distress syndrome or ARDS. This is a condition in which fluid build-up in the lungs prevents the lungs from filling with air and passing oxygen into the blood.  Acute respiratory failure. This is a condition in which there is not enough oxygen passing from the lungs to the body or when carbon dioxide is not passing from the lungs out of the body.  Sepsis or septic shock. This is a serious bodily reaction to an infection.  Blood clotting problems.  Secondary infections due to bacteria or fungus.  Organ failure. This is when your body's organs stop working. The virus that causes COVID-19 is contagious. This means that it can spread from person to person through droplets from coughs and sneezes (respiratory secretions). What are the causes? This illness is caused by a virus. You may catch the virus by:  Breathing in droplets from an infected person. Droplets can be spread by a person breathing, speaking, singing, coughing, or sneezing.  Touching something, like a table or a doorknob, that was exposed to the virus (contaminated) and then touching your mouth, nose, or eyes. What increases the risk? Risk for infection You are more likely to be infected with this virus if you:  Are within 6 feet (2 meters) of a person with COVID-19.  Provide care for or live with a person who is infected with COVID-19.  Spend time in crowded indoor spaces or live in shared housing. Risk for serious illness You are more likely to become seriously ill from the virus if you:   Are 50 years of age or older. The higher your age, the more you are at risk for serious illness.  Live in a nursing home or long-term care facility.  Have cancer.  Have a long-term (chronic) disease such as: ? Chronic lung disease, including chronic obstructive pulmonary disease or asthma. ? A long-term disease that lowers your body's ability to fight infection (immunocompromised). ? Heart disease, including heart failure, a condition in which the arteries that lead to the heart become narrow or blocked (coronary artery disease), a disease which makes the heart muscle thick, weak, or stiff (cardiomyopathy). ? Diabetes. ? Chronic kidney disease. ? Sickle cell disease, a condition in which red blood cells have an abnormal "sickle" shape. ? Liver disease.  Are obese. What are the signs or symptoms? Symptoms of this condition can range from mild to severe. Symptoms may appear any time from 2 to 14 days after being exposed to the virus. They include:  A fever or chills.  A cough.  Difficulty breathing.  Headaches, body aches, or muscle aches.  Runny or stuffy (congested) nose.  A sore throat.  New loss of taste or smell. Some people may also have stomach problems, such as nausea, vomiting, or diarrhea. Other people may not have any symptoms of COVID-19. How is this diagnosed? This condition may be diagnosed based on:  Your signs and symptoms, especially if: ? You live in an area with a COVID-19 outbreak. ? You recently traveled to or from an area where the virus is common. ? You   provide care for or live with a person who was diagnosed with COVID-19. ? You were exposed to a person who was diagnosed with COVID-19.  A physical exam.  Lab tests, which may include: ? Taking a sample of fluid from the back of your nose and throat (nasopharyngeal fluid), your nose, or your throat using a swab. ? A sample of mucus from your lungs (sputum). ? Blood tests.  Imaging tests, which  may include, X-rays, CT scan, or ultrasound. How is this treated? At present, there is no medicine to treat COVID-19. Medicines that treat other diseases are being used on a trial basis to see if they are effective against COVID-19. Your health care provider will talk with you about ways to treat your symptoms. For most people, the infection is mild and can be managed at home with rest, fluids, and over-the-counter medicines. Treatment for a serious infection usually takes places in a hospital intensive care unit (ICU). It may include one or more of the following treatments. These treatments are given until your symptoms improve.  Receiving fluids and medicines through an IV.  Supplemental oxygen. Extra oxygen is given through a tube in the nose, a face mask, or a hood.  Positioning you to lie on your stomach (prone position). This makes it easier for oxygen to get into the lungs.  Continuous positive airway pressure (CPAP) or bi-level positive airway pressure (BPAP) machine. This treatment uses mild air pressure to keep the airways open. A tube that is connected to a motor delivers oxygen to the body.  Ventilator. This treatment moves air into and out of the lungs by using a tube that is placed in your windpipe.  Tracheostomy. This is a procedure to create a hole in the neck so that a breathing tube can be inserted.  Extracorporeal membrane oxygenation (ECMO). This procedure gives the lungs a chance to recover by taking over the functions of the heart and lungs. It supplies oxygen to the body and removes carbon dioxide. Follow these instructions at home: Lifestyle  If you are sick, stay home except to get medical care. Your health care provider will tell you how long to stay home. Call your health care provider before you go for medical care.  Rest at home as told by your health care provider.  Do not use any products that contain nicotine or tobacco, such as cigarettes, e-cigarettes, and  chewing tobacco. If you need help quitting, ask your health care provider.  Return to your normal activities as told by your health care provider. Ask your health care provider what activities are safe for you. General instructions  Take over-the-counter and prescription medicines only as told by your health care provider.  Drink enough fluid to keep your urine pale yellow.  Keep all follow-up visits as told by your health care provider. This is important. How is this prevented?  There is no vaccine to help prevent COVID-19 infection. However, there are steps you can take to protect yourself and others from this virus. To protect yourself:   Do not travel to areas where COVID-19 is a risk. The areas where COVID-19 is reported change often. To identify high-risk areas and travel restrictions, check the CDC travel website: wwwnc.cdc.gov/travel/notices  If you live in, or must travel to, an area where COVID-19 is a risk, take precautions to avoid infection. ? Stay away from people who are sick. ? Wash your hands often with soap and water for 20 seconds. If soap and water   are not available, use an alcohol-based hand sanitizer. ? Avoid touching your mouth, face, eyes, or nose. ? Avoid going out in public, follow guidance from your state and local health authorities. ? If you must go out in public, wear a cloth face covering or face mask. Make sure your mask covers your nose and mouth. ? Avoid crowded indoor spaces. Stay at least 6 feet (2 meters) away from others. ? Disinfect objects and surfaces that are frequently touched every day. This may include:  Counters and tables.  Doorknobs and light switches.  Sinks and faucets.  Electronics, such as phones, remote controls, keyboards, computers, and tablets. To protect others: If you have symptoms of COVID-19, take steps to prevent the virus from spreading to others.  If you think you have a COVID-19 infection, contact your health care  provider right away. Tell your health care team that you think you may have a COVID-19 infection.  Stay home. Leave your house only to seek medical care. Do not use public transport.  Do not travel while you are sick.  Wash your hands often with soap and water for 20 seconds. If soap and water are not available, use alcohol-based hand sanitizer.  Stay away from other members of your household. Let healthy household members care for children and pets, if possible. If you have to care for children or pets, wash your hands often and wear a mask. If possible, stay in your own room, separate from others. Use a different bathroom.  Make sure that all people in your household wash their hands well and often.  Cough or sneeze into a tissue or your sleeve or elbow. Do not cough or sneeze into your hand or into the air.  Wear a cloth face covering or face mask. Make sure your mask covers your nose and mouth. Where to find more information  Centers for Disease Control and Prevention: www.cdc.gov/coronavirus/2019-ncov/index.html  World Health Organization: www.who.int/health-topics/coronavirus Contact a health care provider if:  You live in or have traveled to an area where COVID-19 is a risk and you have symptoms of the infection.  You have had contact with someone who has COVID-19 and you have symptoms of the infection. Get help right away if:  You have trouble breathing.  You have pain or pressure in your chest.  You have confusion.  You have bluish lips and fingernails.  You have difficulty waking from sleep.  You have symptoms that get worse. These symptoms may represent a serious problem that is an emergency. Do not wait to see if the symptoms will go away. Get medical help right away. Call your local emergency services (911 in the [place] Do not drive yourself to the hospital. Let the emergency medical personnel know if you think you have COVID-19. Summary  COVID-19 is a  respiratory infection that is caused by a virus. It is also known as coronavirus disease or novel coronavirus. It can cause serious infections, such as pneumonia, acute respiratory distress syndrome, acute respiratory failure, or sepsis.  The virus that causes COVID-19 is contagious. This means that it can spread from person to person through droplets from breathing, speaking, singing, coughing, or sneezing.  You are more likely to develop a serious illness if you are 50 years of age or older, have a weak immune system, live in a nursing home, or have chronic disease.  There is no medicine to treat COVID-19. Your health care provider will talk with you about ways to treat your symptoms.    Take steps to protect yourself and others from infection. Wash your hands often and disinfect objects and surfaces that are frequently touched every day. Stay away from people who are sick and wear a mask if you are sick. This information is not intended to replace advice given to you by your health care provider. Make sure you discuss any questions you have with your health care provider. Document Revised: 01/22/2019 Document Reviewed: 04/30/2018 Elsevier Patient Education  2020 [employer]  

## 2019-05-04 LAB — CMP14+EGFR
ALT: 18 IU/L (ref 0–32)
AST: 18 IU/L (ref 0–40)
Albumin/Globulin Ratio: 2 (ref 1.2–2.2)
Albumin: 4.1 g/dL (ref 3.8–4.8)
Alkaline Phosphatase: 94 IU/L (ref 39–117)
BUN/Creatinine Ratio: 17 (ref 12–28)
BUN: 19 mg/dL (ref 8–27)
Bilirubin Total: 0.5 mg/dL (ref 0.0–1.2)
CO2: 25 mmol/L (ref 20–29)
Calcium: 10 mg/dL (ref 8.7–10.3)
Chloride: 103 mmol/L (ref 96–106)
Creatinine, Ser: 1.1 mg/dL — ABNORMAL HIGH (ref 0.57–1.00)
GFR calc Af Amer: 59 mL/min/{1.73_m2} — ABNORMAL LOW (ref >59)
GFR calc non Af Amer: 51 mL/min/{1.73_m2} — ABNORMAL LOW (ref >59)
Globulin, Total: 2.1 g/dL (ref 1.5–4.5)
Glucose: 85 mg/dL (ref 65–99)
Potassium: 4.1 mmol/L (ref 3.5–5.2)
Sodium: 142 mmol/L (ref 134–144)
Total Protein: 6.2 g/dL (ref 6.0–8.5)

## 2019-05-04 LAB — LIPID PANEL
Chol/HDL Ratio: 3.7 ratio (ref 0.0–4.4)
Cholesterol, Total: 183 mg/dL (ref 100–199)
HDL: 50 mg/dL (ref >39)
LDL Chol Calc (NIH): 111 mg/dL — ABNORMAL HIGH (ref 0–99)
Triglycerides: 125 mg/dL (ref 0–149)
VLDL Cholesterol Cal: 22 mg/dL (ref 5–40)

## 2019-05-04 LAB — CBC WITH DIFFERENTIAL/PLATELET
Basophils Absolute: 0 10*3/uL (ref 0.0–0.2)
Basos: 0 %
EOS (ABSOLUTE): 0.4 10*3/uL (ref 0.0–0.4)
Eos: 5 %
Hematocrit: 39.9 % (ref 34.0–46.6)
Hemoglobin: 13.6 g/dL (ref 11.1–15.9)
Immature Grans (Abs): 0 10*3/uL (ref 0.0–0.1)
Immature Granulocytes: 0 %
Lymphocytes Absolute: 1.2 10*3/uL (ref 0.7–3.1)
Lymphs: 18 %
MCH: 29.4 pg (ref 26.6–33.0)
MCHC: 34.1 g/dL (ref 31.5–35.7)
MCV: 86 fL (ref 79–97)
Monocytes Absolute: 0.6 10*3/uL (ref 0.1–0.9)
Monocytes: 9 %
Neutrophils Absolute: 4.6 10*3/uL (ref 1.4–7.0)
Neutrophils: 68 %
Platelets: 178 10*3/uL (ref 150–450)
RBC: 4.63 x10E6/uL (ref 3.77–5.28)
RDW: 12.9 % (ref 11.7–15.4)
WBC: 6.9 10*3/uL (ref 3.4–10.8)

## 2019-05-04 LAB — THYROID PANEL WITH TSH
Free Thyroxine Index: 1.6 (ref 1.2–4.9)
T3 Uptake Ratio: 23 % — ABNORMAL LOW (ref 24–39)
T4, Total: 6.9 ug/dL (ref 4.5–12.0)
TSH: 2.73 u[IU]/mL (ref 0.450–4.500)

## 2019-05-04 LAB — FERRITIN: Ferritin: 298 ng/mL — ABNORMAL HIGH (ref 15–150)

## 2019-05-04 LAB — VITAMIN D 25 HYDROXY (VIT D DEFICIENCY, FRACTURES): Vit D, 25-Hydroxy: 100 ng/mL (ref 30.0–100.0)

## 2019-05-10 ENCOUNTER — Other Ambulatory Visit: Payer: Self-pay

## 2019-05-10 ENCOUNTER — Other Ambulatory Visit: Payer: Medicare Other

## 2019-05-10 DIAGNOSIS — Z8619 Personal history of other infectious and parasitic diseases: Secondary | ICD-10-CM

## 2019-05-10 DIAGNOSIS — N390 Urinary tract infection, site not specified: Secondary | ICD-10-CM

## 2019-05-10 DIAGNOSIS — B9620 Unspecified Escherichia coli [E. coli] as the cause of diseases classified elsewhere: Secondary | ICD-10-CM

## 2019-05-10 DIAGNOSIS — R944 Abnormal results of kidney function studies: Secondary | ICD-10-CM

## 2019-05-10 DIAGNOSIS — R300 Dysuria: Secondary | ICD-10-CM

## 2019-05-10 LAB — URINALYSIS, COMPLETE
Bilirubin, UA: NEGATIVE
Glucose, UA: NEGATIVE
Ketones, UA: NEGATIVE
Nitrite, UA: NEGATIVE
Protein,UA: NEGATIVE
RBC, UA: NEGATIVE
Specific Gravity, UA: 1.02 (ref 1.005–1.030)
Urobilinogen, Ur: 0.2 mg/dL (ref 0.2–1.0)
pH, UA: 7 (ref 5.0–7.5)

## 2019-05-10 LAB — MICROSCOPIC EXAMINATION
Bacteria, UA: NONE SEEN
RBC, Urine: NONE SEEN /hpf (ref 0–2)
Renal Epithel, UA: NONE SEEN /hpf

## 2019-05-11 ENCOUNTER — Other Ambulatory Visit: Payer: Self-pay | Admitting: Family Medicine

## 2019-05-11 LAB — BMP8+EGFR
BUN/Creatinine Ratio: 16 (ref 12–28)
BUN: 23 mg/dL (ref 8–27)
CO2: 28 mmol/L (ref 20–29)
Calcium: 10 mg/dL (ref 8.7–10.3)
Chloride: 102 mmol/L (ref 96–106)
Creatinine, Ser: 1.43 mg/dL — ABNORMAL HIGH (ref 0.57–1.00)
GFR calc Af Amer: 43 mL/min/{1.73_m2} — ABNORMAL LOW (ref >59)
GFR calc non Af Amer: 37 mL/min/{1.73_m2} — ABNORMAL LOW (ref >59)
Glucose: 74 mg/dL (ref 65–99)
Potassium: 3.7 mmol/L (ref 3.5–5.2)
Sodium: 144 mmol/L (ref 134–144)

## 2019-05-12 LAB — URINE CULTURE

## 2019-05-12 MED ORDER — FLUCONAZOLE 150 MG PO TABS: 150.0000 mg | ORAL_TABLET | Freq: Once | ORAL | 1 refills | Status: AC

## 2019-05-12 MED ORDER — AMOXICILLIN-POT CLAVULANATE 875-125 MG PO TABS: 1.0000 | ORAL_TABLET | Freq: Two times a day (BID) | ORAL | 0 refills | Status: AC

## 2019-05-14 ENCOUNTER — Other Ambulatory Visit: Payer: Self-pay | Admitting: Family Medicine

## 2019-05-14 ENCOUNTER — Other Ambulatory Visit: Payer: Self-pay

## 2019-05-14 ENCOUNTER — Other Ambulatory Visit: Payer: Medicare Other

## 2019-05-14 DIAGNOSIS — N179 Acute kidney failure, unspecified: Secondary | ICD-10-CM

## 2019-05-14 LAB — BMP8+EGFR
BUN/Creatinine Ratio: 12 (ref 12–28)
BUN: 16 mg/dL (ref 8–27)
CO2: 22 mmol/L (ref 20–29)
Calcium: 9.5 mg/dL (ref 8.7–10.3)
Chloride: 105 mmol/L (ref 96–106)
Creatinine, Ser: 1.29 mg/dL — ABNORMAL HIGH (ref 0.57–1.00)
GFR calc Af Amer: 48 mL/min/{1.73_m2} — ABNORMAL LOW (ref >59)
GFR calc non Af Amer: 42 mL/min/{1.73_m2} — ABNORMAL LOW (ref >59)
Glucose: 88 mg/dL (ref 65–99)
Potassium: 4.2 mmol/L (ref 3.5–5.2)
Sodium: 143 mmol/L (ref 134–144)

## 2019-05-17 ENCOUNTER — Other Ambulatory Visit: Payer: Self-pay

## 2019-05-17 DIAGNOSIS — N179 Acute kidney failure, unspecified: Secondary | ICD-10-CM

## 2019-05-21 ENCOUNTER — Telehealth (INDEPENDENT_AMBULATORY_CARE_PROVIDER_SITE_OTHER): Payer: Self-pay | Admitting: Family Medicine

## 2019-05-21 DIAGNOSIS — J9601 Acute respiratory failure with hypoxia: Secondary | ICD-10-CM

## 2019-05-21 DIAGNOSIS — Z09 Encounter for follow-up examination after completed treatment for conditions other than malignant neoplasm: Secondary | ICD-10-CM

## 2019-05-21 DIAGNOSIS — U071 COVID-19: Secondary | ICD-10-CM

## 2019-05-21 NOTE — Telephone Encounter (Signed)
[facility] Flatten called requesting to speak with either Roselyn Reef or Dr Thayer Ohm as soon as possible regarding patients oxygen level.

## 2019-05-21 NOTE — Telephone Encounter (Signed)
New orders for oxygen concentrator printed and faxed to [facility].  Per family request

## 2019-05-31 ENCOUNTER — Ambulatory Visit (INDEPENDENT_AMBULATORY_CARE_PROVIDER_SITE_OTHER): Payer: Medicare Other | Admitting: *Deleted

## 2019-05-31 DIAGNOSIS — Z0000 Encounter for general adult medical examination without abnormal findings: Secondary | ICD-10-CM | POA: Diagnosis not present

## 2019-05-31 NOTE — Progress Notes (Signed)
MEDICARE ANNUAL WELLNESS VISIT  05/31/2019  Telephone Visit Disclaimer This Medicare AWV was conducted by telephone due to national recommendations for restrictions regarding the COVID-19 Pandemic (e.g. social distancing).  I verified, using two identifiers, that I am speaking with Selena Hunt or their authorized healthcare agent. I discussed the limitations, risks, security, and privacy concerns of performing an evaluation and management service by telephone and the potential availability of an in-person appointment in the future. The patient expressed understanding and agreed to proceed.   Subjective:  Selena Hunt is a 69 y.o. female patient of Rakes, Connye Burkitt, FNP who had a Medicare Annual Wellness Visit today via telephone. [name] is Retired and lives alone. she has 2 children. she reports that she is socially active and does interact with friends/family regularly. she is not currently physically active  and enjoys spending time with her grandchildren. Prior to covid she enjoyed working out at [employer] 3-4 times per week.   Patient Care Team: Rakes, Connye Burkitt, FNP as PCP - General (Family Medicine) Madelin Headings, DO (Optometry) Netta Cedars, MD as Consulting Physician (Orthopedic Surgery) Minus Breeding, MD as Consulting Physician (Cardiology) Thornell Sartorius, MD as Consulting Physician (Otolaryngology) Sherilyn Banker Bevely Palmer, MD as Referring Physician Richard Miu, DMD (Dentistry) Vicie Mutters, MD as Consulting Physician (Otolaryngology) Ladene Artist, MD as Consulting Physician (Gastroenterology)  Advanced Directives 05/31/2019 03/31/2019 03/31/2019 03/31/2019 08/04/2017 07/23/2016 09/05/2013  Does Patient Have a Medical Advance Directive? Yes Yes Yes No Yes Yes Patient has advance directive, copy not in chart  Type of Advance Directive [place];Living will;Out of facility DNR (pink MOST or yellow form) Living will Living will - [place];Living will Living will;Healthcare Power of Attorney Living will;Healthcare Power of Attorney  Does patient want to make changes to medical advance directive? - No - Patient declined - - - Yes (Inpatient - patient defers changing a medical advance directive at this time) No change requested  Copy of [place] in Chart? - - - - No - copy requested No - copy requested Copy requested from family  Pre-existing out of facility DNR order (yellow form or pink MOST form) - - - - - - No    Hospital Utilization Over the Past 12 Months: # of hospitalizations or ER visits: 1 # of surgeries: 0  Review of Systems    Patient reports that her overall health is worse compared to last year. .   Due to Covid hospitalization in December 2020.  Patient Reported Readings (BP, Pulse, CBG, Weight, etc) none  Pain Assessment Pain : No/denies pain     Current Medications & Allergies (verified) Allergies as of 05/31/2019      Reactions   Ambien [zolpidem Tartrate] Anxiety   Diovan [valsartan] Other (See Comments)   Seizures.   Wellbutrin [bupropion] Other (See Comments)   Seizures.   Codeine    Feel crazy and dizzy    Feldene [piroxicam]    Swelling    Guaifenesin Er    Raise Bp / HR    Latex    Swelling    Mobic [meloxicam] Other (See Comments)   Stomach cramps   Neurontin [gabapentin]    "loopy"   Crestor [rosuvastatin Calcium] Other (See Comments)   Myalgias   Lipitor [atorvastatin Calcium] Other (See Comments)   myalgias   Livalo [pitavastatin] Swelling, Other (See Comments)   stomach cramping   Simcor [niacin-simvastatin Er] Other (See Comments)  Stomach cramps   Zocor [simvastatin] Other (See Comments)   Stomach cramps      Medication List       Accurate as of May 31, 2019  9:08 AM. If you have any questions, ask your nurse or doctor.        STOP taking these medications   acetaminophen 325 MG tablet Commonly known as: TYLENOL   ascorbic  acid 500 MG tablet Commonly known as: VITAMIN C   predniSONE 20 MG tablet Commonly known as: Deltasone   zinc sulfate 220 (50 Zn) MG capsule   Zinc Sulfate 220 (50 Zn) MG Tabs     TAKE these medications   albuterol 108 (90 Base) MCG/ACT inhaler Commonly known as: VENTOLIN HFA Inhale 2 puffs into the lungs every 6 (six) hours as needed for wheezing or shortness of breath.   albuterol (2.5 MG/3ML) 0.083% nebulizer solution Commonly known as: PROVENTIL Take 3 mLs (2.5 mg total) by nebulization every 6 (six) hours as needed for wheezing or shortness of breath.   aspirin EC 81 MG tablet Take 81 mg by mouth daily.   calcium-vitamin D 500-200 MG-UNIT tablet Commonly known as: OSCAL WITH D Take 1 tablet by mouth daily with breakfast.   esomeprazole 40 MG capsule Commonly known as: NexIUM Take 1 capsule (40 mg total) by mouth 2 (two) times daily before a meal.   fluticasone 50 MCG/ACT nasal spray Commonly known as: FLONASE Place 2 sprays into both nostrils daily.   HYDROcodone-acetaminophen 5-325 MG tablet Commonly known as: NORCO/VICODIN Take 1 tablet by mouth daily as needed for moderate pain or severe pain.   loratadine 10 MG tablet Commonly known as: CLARITIN Take 1 tablet (10 mg total) by mouth daily.   meclizine 25 MG tablet Commonly known as: ANTIVERT Take 1 tablet (25 mg total) by mouth every 8 (eight) hours as needed for dizziness.   metoprolol tartrate 50 MG tablet Commonly known as: LOPRESSOR Take 0.5 tablets (25 mg total) by mouth daily.   oxybutynin 10 MG 24 hr tablet Commonly known as: DITROPAN-XL Take 10 mg by mouth at bedtime.   pravastatin 20 MG tablet Commonly known as: PRAVACHOL TAKE 1 TABLET BY MOUTH EVERY DAY   Synthroid 100 MCG tablet Generic drug: levothyroxine Take 100 mcg by mouth daily before breakfast. One whole on MON and FRI and 1/2 tab all other days   triamterene-hydrochlorothiazide 37.5-25 MG capsule Commonly known as:  DYAZIDE Take 1 each (1 capsule total) by mouth daily.   Vitamin D (Ergocalciferol) 1.25 MG (50000 UNIT) Caps capsule Commonly known as: DRISDOL TAKE 1 CAPSULE BY MOUTH EVERY 7 DAYS   WOMENS 50+ ADVANCED PO Take by mouth.       History (reviewed): Past Medical History:  Diagnosis Date  . Benign neoplasm of colon   . Blood transfusion without reported diagnosis    x 34 to date  . Chronic kidney disease   . Colon polyps   . Diverticulitis of colon (without mention of hemorrhage)(562.11)   . Diverticulosis of colon (without mention of hemorrhage)   . Essential hypertension, benign   . Female stress incontinence   . GERD (gastroesophageal reflux disease)   . Hematuria   . Hernia of unspecified site of abdominal cavity without mention of obstruction or gangrene   . History of closed head injury 04/21/2018  . Kidney stones    has had one   . Lumbosacral spondylosis without myelopathy   . Meniere disease    Drs are  uncertain of this DX   . Migraine headache   . Neuromuscular disorder ([place])   . Obesity   . Other and unspecified hyperlipidemia   . Pneumonia   . Pulmonary embolism ([place])   . Seizures ([place])    HX of --- from medications,2003  . Stricture and stenosis of esophagus   . Thyroid disease   . Ventral hernia    Past Surgical History:  Procedure Laterality Date  . ABDOMINAL HYSTERECTOMY  1990  . APPENDECTOMY    . [place]   Removed  . FEMORAL VARUS OSTEOTOMY W/ ADDUCTOR RELEASE AND ILIAC CREST BONE GRAFT, PELVIC OSTEOTOMY    . FEMUR FRACTURE SURGERY Left 2003  . HERNIA REPAIR     with mesh, abdominal  . HIP FRACTURE SURGERY Left   . HUMERUS FRACTURE SURGERY Left   . LUMBAR FUSION  1995   Fusion L4 - L5  . PARTIAL COLECTOMY     Secondary to diverticulitis  . TENDON TRANSFER    . TUBAL LIGATION  1972  . ULNAR NERVE REPAIR Left    Family History  Problem Relation Age of Onset  . Alzheimer's disease Mother   . Hypertension Mother   . Stroke  Mother   . Alzheimer's disease Father   . Hypertension Father   . Gout Brother   . Fibromyalgia Daughter   . Hypertension Son   . Depression Son   . Spondylitis Son        spondylosis  . GER disease Son   . Heart disease Maternal Grandmother   . Heart disease Maternal Grandfather   . Congestive Heart Failure Maternal Grandfather   . Stroke Paternal Grandmother   . Hypertension Paternal Grandmother   . Depression Paternal Grandfather   . Suicidality Paternal Grandfather   . Colon cancer Neg Hx   . Esophageal cancer Neg Hx   . Pancreatic cancer Neg Hx   . Stomach cancer Neg Hx   . Liver disease Neg Hx    Social History   Socioeconomic History  . Marital status: Widowed    Spouse name: Deanna Hritz  . Number of children: 2  . Years of education: Not on file  . Highest education level: 12th grade  Occupational History  . 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 Smoking status: Never Smoker  . Smokeless tobacco: Never Used  Substance and Sexual Activity  . Alcohol use: Not Currently    Alcohol/week: 0.0 standard drinks    Comment: wine once or twice a year  . Drug use: No  . Sexual activity: Never    Birth control/protection: Post-menopausal  Other Topics Concern  . Not on file  Social History Narrative   Lives alone.    Caffeine 2 cups daily    Right handed   Social Determinants of Health   Financial Resource Strain:   . Difficulty of Paying Living Expenses: Not on file  Food Insecurity:   . Worried About Charity fundraiser in the Last Year: Not on file  . Ran Out of Food in the Last Year: Not on file  Transportation Needs:   . Lack of Transportation (Medical): Not on file  . Lack of Transportation (Non-Medical): Not on file  Physical Activity:   . Days of Exercise per Week: Not on file  . Minutes of Exercise per Session: Not on file  Stress:   . Feeling of  Stress : Not on file  Social Connections:   .  Frequency of Communication with Friends and Family: Not on file  . Frequency of Social Gatherings with Friends and Family: Not on file  . Attends Religious Services: Not on file  . Active Member of Clubs or Organizations: Not on file  . Attends Archivist Meetings: Not on file  . Marital Status: Not on file    Activities of Daily Living In your present state of health, do you have any difficulty performing the following activities: 05/31/2019 03/31/2019  Hearing? 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 N N  Difficulty concentrating or making decisions? N N  Walking or climbing stairs? N N  Dressing or bathing? N N  Doing errands, shopping? N N  Preparing Food and eating ? N -  Using the Toilet? N -  In the past six months, have you accidently leaked urine? Y -  Comment wears pad, urine leakage, has seen urology -  Do you have problems with loss of bowel control? N -  Managing your Medications? N -  Managing your Finances? N -  Housekeeping or managing your Housekeeping? N -  Some recent data might be hidden    Patient Education/ Literacy How often do you need to have someone help you when you read instructions, pamphlets, or other written materials from your doctor or pharmacy?: 1 - Never What is the last grade level you completed in school?: 12  Exercise Current Exercise Habits: The patient does not participate in regular exercise at present(was going to [employer] 3-4 times weekly before covid), Exercise limited by: respiratory conditions(s)(patient has not been to gym since covid, still recovering from covid pneumonia from [place])  Diet Patient reports consuming 3 meals a day and 1 snack(s) a day Patient reports that her primary diet is: Regular Patient reports that she does have regular access to food.   Depression Screen PHQ 2/9 Scores 05/31/2019 05/03/2019 12/11/2018 04/02/2018 02/10/2018  10/06/2017 08/13/2017  PHQ - 2 Score 0 0 0 1 1 1  0     Fall Risk Fall Risk  05/31/2019 05/03/2019 12/11/2018 04/02/2018 02/10/2018  Falls in the past year? 0 0 0 1 1  Number falls in past yr: - - - 0 0  Injury with Fall? - - - 0 0  Comment - - - - -     Objective:  Selena Hunt seemed alert and oriented and she participated appropriately during our telephone visit.  Blood Pressure Weight BMI  BP Readings from Last 3 Encounters:  05/03/19 101/64  04/04/19 127/67  12/11/18 117/71   Wt Readings from Last 3 Encounters:  05/03/19 194 lb (88 kg)  03/31/19 192 lb 7.4 oz (87.3 kg)  12/11/18 194 lb (88 kg)   BMI Readings from Last 1 Encounters:  05/03/19 36.66 kg/m    *Unable to obtain current vital signs, weight, and BMI due to telephone visit type  Hearing/Vision  . Angelys did not seem to have difficulty with hearing/understanding during the telephone conversation . Reports that she has had a formal eye exam by an eye care professional within the past year . Reports that she has not had a formal hearing evaluation within the past year *Unable to fully assess hearing and vision during telephone visit type  Cognitive Function: 6CIT Screen 05/31/2019  What Year? 0 points  What month? 0 points  What  time? 0 points  Count back from 20 0 points  Months in reverse 2 points  Repeat phrase 0 points  Total Score 2   (Normal:0-7, Significant for Dysfunction: >8)  Normal Cognitive Function Screening: Yes   Immunization & Health Maintenance Record Immunization History  Administered Date(s) Administered  . Pneumococcal Conjugate-13 01/14/2014  . Pneumococcal Polysaccharide-23 03/06/2015  . Td 04/08/2001  . Tdap 01/07/2011    Health Maintenance  Topic Date Due  . COLON CANCER SCREENING ANNUAL FOBT  10/31/2019 (Originally 02/04/2018)  . INFLUENZA VACCINE  12/31/2020 (Originally 11/07/2018)  . MAMMOGRAM  11/05/2020  . TETANUS/TDAP  01/06/2021  . COLONOSCOPY  02/04/2022  . DEXA SCAN   Completed  . Hepatitis C Screening  Completed  . PNA vac Low Risk Adult  Completed       Assessment  This is a routine wellness examination for Selena Hunt.  Health Maintenance: Due or Overdue There are no preventive care reminders to display for this patient.  Selena Hunt does not need a referral for [employer] Assistance: Care Management:   no Social Work:    no Prescription Assistance:  no Nutrition/Diabetes Education:  no   Plan:  Personalized Goals Goals Addressed   None    Personalized Health Maintenance & Screening Recommendations  Up to date.   Lung Cancer Screening Recommended: no (Low Dose CT Chest recommended if Age 45-80 years, 30 pack-year currently smoking OR have quit w/in past 15 years) Hepatitis C Screening recommended: no HIV Screening recommended: no  Advanced Directives: Written information was not prepared per patient's request.  Referrals & Orders No orders of the defined types were placed in this encounter.   Follow-up Plan . Follow-up with Baruch Gouty, FNP as planned .  .    I have personally reviewed and noted the following in the patient's chart:   . Medical and social history . Use of alcohol, tobacco or illicit drugs  . Current medications and supplements . Functional ability and status . Nutritional status . Physical activity . Advanced directives . List of other physicians . Hospitalizations, surgeries, and ER visits in previous 12 months . Vitals . Screenings to include cognitive, depression, and falls . Referrals and appointments  In addition, I have reviewed and discussed with Selena Hunt certain preventive protocols, quality metrics, and best practice recommendations. A written personalized care plan for preventive services as well as general preventive health recommendations is available and can be mailed to the patient at her request.      Alphia Kava B  05/31/2019

## 2019-06-03 ENCOUNTER — Other Ambulatory Visit: Payer: Self-pay

## 2019-06-04 ENCOUNTER — Ambulatory Visit (INDEPENDENT_AMBULATORY_CARE_PROVIDER_SITE_OTHER): Payer: Medicare Other | Admitting: Family Medicine

## 2019-06-04 ENCOUNTER — Encounter: Payer: Self-pay | Admitting: Family Medicine

## 2019-06-04 ENCOUNTER — Ambulatory Visit (INDEPENDENT_AMBULATORY_CARE_PROVIDER_SITE_OTHER): Payer: Medicare Other

## 2019-06-04 VITALS — BP 114/70 | HR 77 | Temp 97.1°F | Resp 22 | Ht 61.0 in | Wt 202.0 lb

## 2019-06-04 DIAGNOSIS — R000 Tachycardia, unspecified: Secondary | ICD-10-CM

## 2019-06-04 DIAGNOSIS — J9601 Acute respiratory failure with hypoxia: Secondary | ICD-10-CM

## 2019-06-04 DIAGNOSIS — N179 Acute kidney failure, unspecified: Secondary | ICD-10-CM | POA: Diagnosis not present

## 2019-06-04 DIAGNOSIS — R7989 Other specified abnormal findings of blood chemistry: Secondary | ICD-10-CM

## 2019-06-04 DIAGNOSIS — R0609 Other forms of dyspnea: Secondary | ICD-10-CM

## 2019-06-04 DIAGNOSIS — R0600 Dyspnea, unspecified: Secondary | ICD-10-CM | POA: Diagnosis not present

## 2019-06-04 MED ORDER — FUROSEMIDE 20 MG PO TABS: 20.0000 mg | ORAL_TABLET | Freq: Every day | ORAL | 3 refills | Status: DC
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
## 2019-06-04 NOTE — Patient Instructions (Signed)
Home Oxygen Use, Adult When a medical condition keeps you from getting enough oxygen, your health care provider may instruct you to take extra oxygen at home. Your health care provider will let you know:  When to take oxygen.  For how long to take oxygen.  How quickly oxygen should be delivered (flow rate), in liters per minute (LPM or L/M). Home oxygen can be given through:  A mask.  A nasal cannula. This is a device or tube that goes in the nostrils.  A transtracheal catheter. This is a small, flexible tube placed in the trachea.  A tracheostomy. This is a surgically made opening in the trachea. These devices are connected with tubing to an oxygen source, such as:  A tank. Tanks hold oxygen in gas form. They must be replaced when the oxygen is used up.  A liquid oxygen device. This holds oxygen in liquid form. It must be replaced when the oxygen is used up.  An oxygen concentrator machine. This filters oxygen in the room. It uses electricity, so you must have a backup cylinder of oxygen in case the power goes out. Supplies needed: To use oxygen, you will need:  A mask, nasal cannula, transtracheal catheter, or tracheostomy.  An oxygen tank, a liquid oxygen device, or an oxygen concentrator.  The tape that your health care provider recommends (optional). If you use a transtracheal catheter and your prescribed flow rate is 1 LPM or greater, you will also need a humidifier. Risks and complications  Fire. This can happen if the oxygen is exposed to a heat source, flame, or spark.  Injury to skin. This can happen if liquid oxygen touches your skin.  Organ damage. This can happen if you get too little oxygen. How to use oxygen Your health care provider or a representative from your medical device company will show you how to use your oxygen device. Follow her or his instructions. The instructions may look something like this: 1. Wash your hands. 2. If you use an oxygen  concentrator, make sure it is plugged in. 3. Place one end of the tube into the port on the tank, device, or machine. 4. Place the mask over your nose and mouth. Or, place the nasal cannula and secure it with tape if instructed. If you use a tracheostomy or transtracheal catheter, connect it to the oxygen source as directed. 5. Make sure the liter-flow setting on the machine is at the level prescribed by your health care provider. 6. Turn on the machine or adjust the knob on the tank or device to the correct liter-flow setting. 7. When you are done, turn off and unplug the machine, or turn the knob to OFF. How to clean and care for the oxygen supplies Nasal cannula  Clean it with a warm, wet cloth daily or as needed.  Wash it with a liquid soap once a week.  Rinse it thoroughly once or twice a week.  Replace it every 2-4 weeks.  If you have an infection, such as a cold or pneumonia, change the cannula when you get better. Mask  Replace it every 2-4 weeks.  If you have an infection, such as a cold or pneumonia, change the mask when you get better. Humidifier bottle  Wash the bottle between each refill: ? Wash it with soap and warm water. ? Rinse it thoroughly. ? Disinfect it and its top. ? Air-dry it.  Make sure it is dry before you refill it. Oxygen concentrator  Clean   the air filter at least twice a week according to directions from your home medical equipment and service company.  Wipe down the cabinet every day. To do this: ? Unplug the unit. ? Wipe down the cabinet with a damp cloth. ? Dry the cabinet. Other equipment  Change any extra tubing every 1-3 months.  Follow instructions from your health care provider about taking care of any other equipment. Safety tips Fire safety tips   Keep your oxygen and oxygen supplies at least 5 ft away from sources of heat, flames, and sparks at all times.  Do not allow smoking near your oxygen. Put up "no smoking" signs in  your home. Avoid smoking areas when in public.  Do not use materials that can burn (are flammable) while you use oxygen.  When you go to a restaurant with portable oxygen, ask to be seated in the nonsmoking section.  Keep a fire extinguisher close by. Let your fire department know that you have oxygen in your home.  Test your home smoke detectors regularly. Traveling  Secure your oxygen tank in the vehicle so that it does not move around. Follow instructions from your medical device company about how to safely secure your tank.  Make sure you have enough oxygen for the amount of time you will be away from home.  If you are planning air travel, contact the airline to find out if they allow the use of an approved portable oxygen concentrator. You may also need documents from your health care provider and medical device company before you travel. General safety tips  If you use an oxygen cylinder, make sure it is in a stand or secured to an object that will not move (fixed object).  If you use liquid oxygen, make sure its container is kept upright.  If you use an oxygen concentrator: ? Tell your electric company. Make sure you are given priority service in the event that your power goes out. ? Avoid using extension cords, if possible. Follow these instructions at home:  Use oxygen only as told by your health care provider.  Do not use alcohol or other drugs that make you relax (sedating drugs) unless instructed. They can slow down your breathing rate and make it hard to get in enough oxygen.  Know how and when to order a refill of oxygen.  Always keep a spare tank of oxygen. Plan ahead for holidays when you may not be able to get a prescription filled.  Use water-based lubricants on your lips or nostrils. Do not use oil-based products like petroleum jelly.  To prevent skin irritation on your cheeks or behind your ears, tuck some gauze under the tubing. Contact a health care  provider if:  You get headaches often.  You have shortness of breath.  You have a lasting cough.  You have anxiety.  You are sleepy all the time.  You develop an illness that affects your breathing.  You cannot exercise at your regular level.  You are restless.  You have difficult or irregular breathing, and it is getting worse.  You have a fever.  You have persistent redness under your nose. Get help right away if:  You are confused.  You have blue lips or fingernails.  You are struggling to breathe. Summary  Your health care provider or a representative from your medical device company will show you how to use your oxygen device. Follow her or his instructions.  If you use an oxygen concentrator, make   sure it is plugged in.  Make sure the liter-flow setting on the machine is at the level prescribed by your health care provider.  Keep your oxygen and oxygen supplies at least 5 ft away from sources of heat, flames, and sparks at all times. This information is not intended to replace advice given to you by your health care provider. Make sure you discuss any questions you have with your health care provider. Document Revised: 09/11/2017 Document Reviewed: 10/17/2015 Elsevier Patient Education  2020 Elsevier Inc.  

## 2019-06-05 LAB — COMPREHENSIVE METABOLIC PANEL
ALT: 18 IU/L (ref 0–32)
AST: 22 IU/L (ref 0–40)
Albumin/Globulin Ratio: 2.2 (ref 1.2–2.2)
Albumin: 3.9 g/dL (ref 3.8–4.8)
Alkaline Phosphatase: 86 IU/L (ref 39–117)
BUN/Creatinine Ratio: 13 (ref 12–28)
BUN: 14 mg/dL (ref 8–27)
Bilirubin Total: 0.4 mg/dL (ref 0.0–1.2)
CO2: 25 mmol/L (ref 20–29)
Calcium: 9.6 mg/dL (ref 8.7–10.3)
Chloride: 105 mmol/L (ref 96–106)
Creatinine, Ser: 1.05 mg/dL — ABNORMAL HIGH (ref 0.57–1.00)
GFR calc Af Amer: 62 mL/min/{1.73_m2} (ref >59)
GFR calc non Af Amer: 54 mL/min/{1.73_m2} — ABNORMAL LOW (ref >59)
Globulin, Total: 1.8 g/dL (ref 1.5–4.5)
Glucose: 93 mg/dL (ref 65–99)
Potassium: 4.1 mmol/L (ref 3.5–5.2)
Sodium: 144 mmol/L (ref 134–144)
Total Protein: 5.7 g/dL — ABNORMAL LOW (ref 6.0–8.5)

## 2019-06-05 LAB — FERRITIN: Ferritin: 180 ng/mL — ABNORMAL HIGH (ref 15–150)

## 2019-06-07 ENCOUNTER — Telehealth: Payer: Self-pay | Admitting: Family Medicine

## 2019-06-07 NOTE — Telephone Encounter (Signed)
Selena Hunt - patient's daughter is requesting to talk to [facility].  States [place] [facility] states the Oxygen test was not done right.  Please call back.

## 2019-06-08 NOTE — Telephone Encounter (Signed)
Note addend and re-faxed

## 2019-06-14 ENCOUNTER — Telehealth: Payer: Self-pay | Admitting: Family Medicine

## 2019-06-14 ENCOUNTER — Other Ambulatory Visit: Payer: Self-pay | Admitting: Family Medicine

## 2019-06-14 DIAGNOSIS — J9601 Acute respiratory failure with hypoxia: Secondary | ICD-10-CM

## 2019-06-14 DIAGNOSIS — R0609 Other forms of dyspnea: Secondary | ICD-10-CM

## 2019-06-14 DIAGNOSIS — U071 COVID-19: Secondary | ICD-10-CM

## 2019-06-14 NOTE — Telephone Encounter (Signed)
Patient aware and states she just wants to see someone in the [facility]. Aware referral will be placed.

## 2019-06-14 NOTE — Telephone Encounter (Signed)
Pt called stating that even though her insurance doesn't require a referral for her to see a Pulomologist, she was told by the Cardiologist that we need to call and schedule her an appt with the Pulmonologist so pt can be seen sooner since it is COVID related.

## 2019-06-14 NOTE — Telephone Encounter (Signed)
Thanks, I will put the referral in.

## 2019-06-14 NOTE — Telephone Encounter (Signed)
Will place referral. Does she have a preference as to who she wants to see?

## 2019-06-14 NOTE — Telephone Encounter (Signed)
aware

## 2019-06-15 ENCOUNTER — Telehealth: Payer: Self-pay | Admitting: Family Medicine

## 2019-06-17 ENCOUNTER — Telehealth: Payer: Self-pay | Admitting: Family Medicine

## 2019-06-17 NOTE — Telephone Encounter (Signed)
F/u with rakes made

## 2019-06-23 ENCOUNTER — Encounter: Payer: Self-pay | Admitting: *Deleted

## 2019-06-23 ENCOUNTER — Telehealth: Payer: Self-pay | Admitting: Cardiology

## 2019-06-23 ENCOUNTER — Other Ambulatory Visit: Payer: Self-pay

## 2019-06-23 ENCOUNTER — Encounter: Payer: Self-pay | Admitting: Cardiology

## 2019-06-23 ENCOUNTER — Ambulatory Visit (INDEPENDENT_AMBULATORY_CARE_PROVIDER_SITE_OTHER): Payer: Medicare Other | Admitting: Cardiology

## 2019-06-23 VITALS — BP 114/72 | HR 63 | Ht 61.0 in | Wt 197.4 lb

## 2019-06-23 DIAGNOSIS — R0602 Shortness of breath: Secondary | ICD-10-CM

## 2019-06-23 DIAGNOSIS — R000 Tachycardia, unspecified: Secondary | ICD-10-CM | POA: Diagnosis not present

## 2019-06-23 NOTE — Patient Instructions (Addendum)
Your physician recommends that you schedule a follow-up appointment in: 2 [place]  Your physician recommends that you continue on your current medications as directed. Please refer to the Current Medication list given to you today.  Your physician has requested that you have an echocardiogram. Echocardiography is a painless test that uses sound waves to create images of your heart. It provides your doctor with information about the size and shape of your heart and how well your heart's chambers and valves are working. This procedure takes approximately one hour. There are no restrictions for this procedure.  Your physician has recommended that you wear an event monitor FOR 7 DAYS. Event monitors are medical devices that record the heart's electrical activity. Doctors most often [place] these monitors to diagnose arrhythmias. Arrhythmias are problems with the speed or rhythm of the heartbeat. The monitor is a small, portable device. You can wear one while you do your normal daily activities. This is usually used to diagnose what is causing palpitations/syncope (passing out).  Thank you for choosing [facility]!!

## 2019-06-23 NOTE — Telephone Encounter (Signed)
  Precert needed for:   ECHO   Location: [facility]    Date: June 29, 2019

## 2019-06-23 NOTE — Addendum Note (Signed)
Addended by: Julian Hy T on: 06/23/2019 11:13 AM   Modules accepted: Orders

## 2019-06-23 NOTE — Progress Notes (Signed)
Clinical Summary Ms. Smaii is a 69 y.o.female  last seen by Dr Percival Spanish over 3 years ago, seen by our office today as a new patient.  1. History of SOB/Arm pain - was evaluated by Dr Percival Spanish in 2017 - 09/2015 nuclear stress no ischemia     2. COVID 19 infection in Dec 2020/SOB - has been on home O2  06/04/19 CXR stable intersitial densities, most conistent with scarring - remains on 2L [place] - DOE just walking in from parking lot - has appt with pulmonary - she reports chronic left sided swelling. Some increase in bilateral swelling - some AKI, diuretic was lowered. Takes lasix 20mg  qod. She is off maxzide.  12/2018 weight 194 lbs precovid, today she is 197 lbs - prior to covid worked out at [employer] without any symptoms, since ongoing DOE remains on 2L [place]  3.Labile heart rates - she reports up and down heart rates. Can be as low 35, as high as 140s. One time up to 190s. She has been monitoring with her pulse oximeter - isoalted episode of palpitations.    Past Medical History:  Diagnosis Date  . Benign neoplasm of colon   . Blood transfusion without reported diagnosis    x 34 to date  . Chronic kidney disease   . Colon polyps   . Diverticulitis of colon (without mention of hemorrhage)(562.11)   . Diverticulosis of colon (without mention of hemorrhage)   . Essential hypertension, benign   . Female stress incontinence   . GERD (gastroesophageal reflux disease)   . Hematuria   . Hernia of unspecified site of abdominal cavity without mention of obstruction or gangrene   . History of closed head injury 04/21/2018  . Kidney stones    has had one   . Lumbosacral spondylosis without myelopathy   . Meniere disease    Drs are uncertain of this DX   . Migraine headache   . Neuromuscular disorder ([place])   . Obesity   . Other and unspecified hyperlipidemia   . Pneumonia   . Pulmonary embolism ([place])   . Seizures ([place])    HX of --- from medications,2003  . Stricture  and stenosis of esophagus   . Thyroid disease   . Ventral hernia      Allergies  Allergen Reactions  . Ambien [Zolpidem Tartrate] Anxiety  . Diovan [Valsartan] Other (See Comments)    Seizures.  . Wellbutrin [Bupropion] Other (See Comments)    Seizures.  . Codeine     Feel crazy and dizzy   . Feldene [Piroxicam]     Swelling   . Guaifenesin Er     Raise Bp / HR   . Latex     Swelling   . Mobic [Meloxicam] Other (See Comments)    Stomach cramps  . Neurontin [Gabapentin]     "loopy"  . Crestor [Rosuvastatin Calcium] Other (See Comments)    Myalgias   . Lipitor [Atorvastatin Calcium] Other (See Comments)    myalgias  . Livalo [Pitavastatin] Swelling and Other (See Comments)    stomach cramping   . Simcor [Niacin-Simvastatin Er] Other (See Comments)    Stomach cramps  . Zocor [Simvastatin] Other (See Comments)    Stomach cramps     Current Outpatient Medications  Medication Sig Dispense Refill  . albuterol (PROVENTIL) (2.5 MG/3ML) 0.083% nebulizer solution Take 3 mLs (2.5 mg total) by nebulization every 6 (six) hours as needed for wheezing or shortness of breath.  150 mL 1  . albuterol (VENTOLIN HFA) 108 (90 Base) MCG/ACT inhaler Inhale 2 puffs into the lungs every 6 (six) hours as needed for wheezing or shortness of breath. 8 g 0  . aspirin EC 81 MG tablet Take 81 mg by mouth daily.    . calcium-vitamin D (OSCAL WITH D) 500-200 MG-UNIT tablet Take 1 tablet by mouth daily with breakfast. 90 tablet 1  . esomeprazole (NEXIUM) 40 MG capsule Take 1 capsule (40 mg total) by mouth 2 (two) times daily before a meal. 60 capsule 3  . fluticasone (FLONASE) 50 MCG/ACT nasal spray Place 2 sprays into both nostrils daily. 16 g 6  . furosemide (LASIX) 20 MG tablet Take 1 tablet (20 mg total) by mouth daily. 30 tablet 3  . HYDROcodone-acetaminophen (NORCO/VICODIN) 5-325 MG tablet Take 1 tablet by mouth daily as needed for moderate pain or severe pain. 30 tablet 0  . levothyroxine  (SYNTHROID) 100 MCG tablet Take 100 mcg by mouth daily before breakfast. One whole on MON and FRI and 1/2 tab all other days    . loratadine (CLARITIN) 10 MG tablet Take 1 tablet (10 mg total) by mouth daily. 30 tablet 11  . meclizine (ANTIVERT) 25 MG tablet Take 1 tablet (25 mg total) by mouth every 8 (eight) hours as needed for dizziness. 30 tablet 2  . metoprolol tartrate (LOPRESSOR) 50 MG tablet Take 0.5 tablets (25 mg total) by mouth daily. 45 tablet 1  . Multiple Vitamins-Minerals (WOMENS 50+ ADVANCED PO) Take by mouth.    . oxybutynin (DITROPAN-XL) 10 MG 24 hr tablet Take 10 mg by mouth at bedtime.    . pravastatin (PRAVACHOL) 20 MG tablet TAKE 1 TABLET BY MOUTH EVERY DAY 90 tablet 3  . triamterene-hydrochlorothiazide (DYAZIDE) 37.5-25 MG capsule Take 1 each (1 capsule total) by mouth daily. (Patient not taking: Reported on 06/04/2019)    . Vitamin D, Ergocalciferol, (DRISDOL) 1.25 MG (50000 UT) CAPS capsule TAKE 1 CAPSULE BY MOUTH EVERY 7 DAYS 12 capsule 3   No current facility-administered medications for this visit.     Past Surgical History:  Procedure Laterality Date  . ABDOMINAL HYSTERECTOMY  1990  . APPENDECTOMY    . [place]   Removed  . FEMORAL VARUS OSTEOTOMY W/ ADDUCTOR RELEASE AND ILIAC CREST BONE GRAFT, PELVIC OSTEOTOMY    . FEMUR FRACTURE SURGERY Left 2003  . HERNIA REPAIR     with mesh, abdominal  . HIP FRACTURE SURGERY Left   . HUMERUS FRACTURE SURGERY Left   . LUMBAR FUSION  1995   Fusion L4 - L5  . PARTIAL COLECTOMY     Secondary to diverticulitis  . TENDON TRANSFER    . TUBAL LIGATION  1972  . ULNAR NERVE REPAIR Left      Allergies  Allergen Reactions  . Ambien [Zolpidem Tartrate] Anxiety  . Diovan [Valsartan] Other (See Comments)    Seizures.  . Wellbutrin [Bupropion] Other (See Comments)    Seizures.  . Codeine     Feel crazy and dizzy   . Feldene [Piroxicam]     Swelling   . Guaifenesin Er     Raise Bp / HR   . Latex      Swelling   . Mobic [Meloxicam] Other (See Comments)    Stomach cramps  . Neurontin [Gabapentin]     "loopy"  . Crestor [Rosuvastatin Calcium] Other (See Comments)    Myalgias   . Lipitor [Atorvastatin Calcium] Other (See Comments)  myalgias  . Livalo [Pitavastatin] Swelling and Other (See Comments)    stomach cramping   . Simcor [Niacin-Simvastatin Er] Other (See Comments)    Stomach cramps  . Zocor [Simvastatin] Other (See Comments)    Stomach cramps      Family History  Problem Relation Age of Onset  . Alzheimer's disease Mother   . Hypertension Mother   . Stroke Mother   . Alzheimer's disease Father   . Hypertension Father   . Gout Brother   . Fibromyalgia Daughter   . Hypertension Son   . Depression Son   . Spondylitis Son        spondylosis  . GER disease Son   . Heart disease Maternal Grandmother   . Heart disease Maternal Grandfather   . Congestive Heart Failure Maternal Grandfather   . Stroke Paternal Grandmother   . Hypertension Paternal Grandmother   . Depression Paternal Grandfather   . Suicidality Paternal Grandfather   . Colon cancer Neg Hx   . Esophageal cancer Neg Hx   . Pancreatic cancer Neg Hx   . Stomach cancer Neg Hx   . Liver disease Neg Hx      Social History Ms. Lickliter reports that she has never smoked. She has never used smokeless tobacco. Ms. Haun reports previous alcohol use.   Review of Systems CONSTITUTIONAL: No weight loss, fever, chills, weakness or fatigue.  HEENT: Eyes: No visual loss, blurred vision, double vision or yellow sclerae.No hearing loss, sneezing, congestion, runny nose or sore throat.  SKIN: No rash or itching.  CARDIOVASCULAR: per hpi RESPIRATORY: per hpi GASTROINTESTINAL: No anorexia, nausea, vomiting or diarrhea. No abdominal pain or blood.  GENITOURINARY: No burning on urination, no polyuria NEUROLOGICAL: No headache, dizziness, syncope, paralysis, ataxia, numbness or tingling in the extremities. No  change in bowel or bladder control.  MUSCULOSKELETAL: No muscle, back pain, joint pain or stiffness.  LYMPHATICS: No enlarged nodes. No history of splenectomy.  PSYCHIATRIC: No history of depression or anxiety.  ENDOCRINOLOGIC: No reports of sweating, cold or heat intolerance. No polyuria or polydipsia.  Marland Kitchen   Physical Examination Today's Vitals   06/23/19 1034  BP: 114/72  Pulse: 63  SpO2: 97%  Weight: 197 lb 6.4 oz (89.5 kg)  Height: 5\' 1"  (1.549 m)   Body mass index is 37.3 kg/m.  Gen: resting comfortably, no acute distress HEENT: no scleral icterus, pupils equal round and reactive, no palptable cervical adenopathy,  CV: RRR, no m/r/g, no jvd Resp: Clear to auscultation bilaterally GI: abdomen is soft, non-tender, non-distended, normal bowel sounds, no hepatosplenomegaly MSK: extremities are warm, no edema.  Skin: warm, no rash Neuro:  no focal deficits Psych: appropriate affect     Assessment and Plan  1. SOB/DOE - ongoing symptoms since COVID infection in Dec, prior to that was working out at [employer] without any issues. Remains on 2L [place], ongoing issues with hypoxia - does not appear volume overloaded by exam. We will obtain an echo due to her ongoing symptoms of SOB/DOE, continue lasix 20mg  qod. Recent CXR with old scarring, may add bnp on to labs pending workup  2. Labile heart rates - noted by her home oximeter, lows in 30s highs to 140s - EKG from pcp shows NSR - we will obtain a 7 day event monitor    F/u 2 months IC or vv   Arnoldo Lenis, M.D.

## 2019-06-23 NOTE — Telephone Encounter (Signed)
Precert for 7 DAY EVENT MONITOR

## 2019-06-28 ENCOUNTER — Other Ambulatory Visit: Payer: Self-pay

## 2019-06-28 ENCOUNTER — Encounter: Payer: Self-pay | Admitting: Pulmonary Disease

## 2019-06-28 ENCOUNTER — Ambulatory Visit (INDEPENDENT_AMBULATORY_CARE_PROVIDER_SITE_OTHER): Payer: Medicare Other | Admitting: Pulmonary Disease

## 2019-06-28 VITALS — BP 118/70 | HR 73 | Temp 98.2°F | Ht 61.0 in | Wt 203.8 lb

## 2019-06-28 DIAGNOSIS — U071 COVID-19: Secondary | ICD-10-CM | POA: Diagnosis not present

## 2019-06-28 DIAGNOSIS — J1282 Pneumonia due to coronavirus disease 2019: Secondary | ICD-10-CM | POA: Diagnosis not present

## 2019-06-28 DIAGNOSIS — R0602 Shortness of breath: Secondary | ICD-10-CM | POA: Diagnosis not present

## 2019-06-28 NOTE — Progress Notes (Signed)
Selena Hunt    [id number]    15-Mar-1951  Primary Care Physician:Rakes, Connye Burkitt, FNP  Referring Physician: Baruch Gouty, [place],  [place] [phone]  Chief complaint:   Occasional desaturations Tachyarrhythmia Recent Covid infection  HPI:  Patient was hospitalized for Covid just prior to Christmas, 5-day hospitalization Discharged on home oxygen  Past history of lung contusion bilateral lung collapse in 2003 for which she was on ECMO for about 27 days she stated Require a lot of rehab but recovered fully was not on oxygen Was not limited prior to recent Covid diagnosis  She experiences desaturations at rest Some shortness of breath with activity She measures her oxygen on a regular basis She may be sitting down not doing anything, all of a sudden notices that her oxygen levels are low, associated with this is tachycardia/ bradycardia -heart rate will go into the 170s and at least on occasions noted to be in the 30s in a few hours -She is following up with cardiology She has not noticed any progressive symptoms She has an occasional cough She was on DVT prophylaxis and aspirin currently    Outpatient Encounter Medications as of 06/28/2019  Medication Sig  . albuterol (PROVENTIL) (2.5 MG/3ML) 0.083% nebulizer solution Take 3 mLs (2.5 mg total) by nebulization every 6 (six) hours as needed for wheezing or shortness of breath.  Marland Kitchen albuterol (VENTOLIN HFA) 108 (90 Base) MCG/ACT inhaler Inhale 2 puffs into the lungs every 6 (six) hours as needed for wheezing or shortness of breath.  Marland Kitchen aspirin EC 81 MG tablet Take 81 mg by mouth daily.  . calcium-vitamin D (OSCAL WITH D) 500-200 MG-UNIT tablet Take 1 tablet by mouth daily with breakfast.  . esomeprazole (NEXIUM) 40 MG capsule Take 1 capsule (40 mg total) by mouth 2 (two) times daily before a meal.  . fluticasone (FLONASE) 50 MCG/ACT nasal spray Place 2 sprays into both nostrils daily.  . furosemide  (LASIX) 20 MG tablet Take 1 tablet (20 mg total) by mouth daily.  Marland Kitchen HYDROcodone-acetaminophen (NORCO/VICODIN) 5-325 MG tablet Take 1 tablet by mouth daily as needed for moderate pain or severe pain.  Marland Kitchen levothyroxine (SYNTHROID) 100 MCG tablet Take 100 mcg by mouth daily before breakfast. One whole on MON and FRI and 1/2 tab all other days  . loratadine (CLARITIN) 10 MG tablet Take 1 tablet (10 mg total) by mouth daily.  . meclizine (ANTIVERT) 25 MG tablet Take 1 tablet (25 mg total) by mouth every 8 (eight) hours as needed for dizziness.  . metoprolol tartrate (LOPRESSOR) 50 MG tablet Take 0.5 tablets (25 mg total) by mouth daily.  . Multiple Vitamins-Minerals (WOMENS 50+ ADVANCED PO) Take by mouth.  . oxybutynin (DITROPAN-XL) 10 MG 24 hr tablet Take 10 mg by mouth at bedtime.  . pravastatin (PRAVACHOL) 20 MG tablet TAKE 1 TABLET BY MOUTH EVERY DAY  . Vitamin D, Ergocalciferol, (DRISDOL) 1.25 MG (50000 UT) CAPS capsule TAKE 1 CAPSULE BY MOUTH EVERY 7 DAYS   No facility-administered encounter medications on file as of 06/28/2019.    Allergies as of 06/28/2019 - Review Complete 06/28/2019  Allergen Reaction Noted  . Ambien [zolpidem tartrate] Anxiety 08/27/2012  . Diovan [valsartan] Other (See Comments) 08/27/2012  . Wellbutrin [bupropion] Other (See Comments) 08/27/2012  . Codeine  06/27/2010  . Feldene [piroxicam]  06/27/2010  . Guaifenesin er  06/27/2010  . Latex  06/27/2010  . Mobic [meloxicam] Other (  See Comments) 09/13/2013  . Neurontin [gabapentin]  06/27/2010  . Crestor [rosuvastatin calcium] Other (See Comments) 08/07/2015  . Lipitor [atorvastatin calcium] Other (See Comments) 06/27/2010  . Livalo [pitavastatin] Swelling and Other (See Comments) 09/17/2012  . Simcor [niacin-simvastatin er] Other (See Comments) 08/07/2015  . Zocor [simvastatin] Other (See Comments) 06/27/2010    Past Medical History:  Diagnosis Date  . Benign neoplasm of colon   . Blood transfusion without  reported diagnosis    x 34 to date  . Chronic kidney disease   . Colon polyps   . Diverticulitis of colon (without mention of hemorrhage)(562.11)   . Diverticulosis of colon (without mention of hemorrhage)   . Essential hypertension, benign   . Female stress incontinence   . GERD (gastroesophageal reflux disease)   . Hematuria   . Hernia of unspecified site of abdominal cavity without mention of obstruction or gangrene   . History of closed head injury 04/21/2018  . Kidney stones    has had one   . Lumbosacral spondylosis without myelopathy   . Meniere disease    Drs are uncertain of this DX   . Migraine headache   . Neuromuscular disorder ([place])   . Obesity   . Other and unspecified hyperlipidemia   . Pneumonia   . Pulmonary embolism ([place] [address])   . Seizures ([place])    HX of --- from medications,2003  . Stricture and stenosis of esophagus   . Thyroid disease   . Ventral hernia     Past Surgical History:  Procedure Laterality Date  . ABDOMINAL HYSTERECTOMY  1990  . APPENDECTOMY    . [place]   Removed  . FEMORAL VARUS OSTEOTOMY W/ ADDUCTOR RELEASE AND ILIAC CREST BONE GRAFT, PELVIC OSTEOTOMY    . FEMUR FRACTURE SURGERY Left 2003  . HERNIA REPAIR     with mesh, abdominal  . HIP FRACTURE SURGERY Left   . HUMERUS FRACTURE SURGERY Left   . LUMBAR FUSION  1995   Fusion L4 - L5  . PARTIAL COLECTOMY     Secondary to diverticulitis  . TENDON TRANSFER    . TUBAL LIGATION  1972  . ULNAR NERVE REPAIR Left     Family History  Problem Relation Age of Onset  . Alzheimer's disease Mother   . Hypertension Mother   . Stroke Mother   . Alzheimer's disease Father   . Hypertension Father   . Gout Brother   . Fibromyalgia Daughter   . Hypertension Son   . Depression Son   . Spondylitis Son        spondylosis  . GER disease Son   . Heart disease Maternal Grandmother   . Heart disease Maternal Grandfather   . Congestive Heart Failure Maternal Grandfather   . Stroke  Paternal Grandmother   . Hypertension Paternal Grandmother   . Depression Paternal Grandfather   . Suicidality Paternal Grandfather   . Colon cancer Neg Hx   . Esophageal cancer Neg Hx   . Pancreatic cancer Neg Hx   . Stomach cancer Neg Hx   . Liver disease Neg Hx     Social History   Socioeconomic History  . Marital status: Widowed    Spouse name: Kamile Ciccarello  . Number of children: 2  . Years of education: Not on file  . Highest education level: 12th grade  Occupational History  . 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 Smoking status: Never Smoker  . Smokeless tobacco: Never Used  Substance and Sexual Activity  . Alcohol use: Not Currently    Alcohol/week: 0.0 standard drinks    Comment: wine once or twice a year  . Drug use: No  . Sexual activity: Never    Birth control/protection: Post-menopausal  Other Topics Concern  . Not on file  Social History Narrative   Lives alone.    Caffeine 2 cups daily    Right handed   Social Determinants of Health   Financial Resource Strain:   . Difficulty of Paying Living Expenses:   Food Insecurity:   . Worried About Charity fundraiser in the Last Year:   . Arboriculturist in the Last Year:   Transportation Needs:   . Film/video editor (Medical):   Marland Kitchen Lack of Transportation (Non-Medical):   Physical Activity:   . Days of Exercise per Week:   . Minutes of Exercise per Session:   Stress:   . Feeling of Stress :   Social Connections:   . Frequency of Communication with Friends and Family:   . Frequency of Social Gatherings with Friends and Family:   . Attends Religious Services:   . Active Member of Clubs or Organizations:   . Attends Archivist Meetings:   Marland Kitchen Marital Status:   Intimate Partner Violence:   . Fear of Current or Ex-Partner:   . Emotionally Abused:   Marland Kitchen Physically Abused:   . Sexually Abused:     Review of Systems  Respiratory: Positive for  cough and shortness of breath.     Vitals:   06/28/19 1341  BP: 118/70  Pulse: 73  Temp: 98.2 F (36.8 C)  SpO2: 97%     Physical Exam  Constitutional: She appears well-developed and well-nourished.  HENT:  Head: Normocephalic and atraumatic.  Eyes: Pupils are equal, round, and reactive to light.  Neck: No tracheal deviation present. No thyromegaly present.  Cardiovascular: Normal rate and regular rhythm.  Pulmonary/Chest: Effort normal and breath sounds normal. No respiratory distress. She has no wheezes. She has no rales. She exhibits no tenderness.  Musculoskeletal:        General: Normal range of motion.     Cervical back: Normal range of motion and neck supple.  Neurological: She is alert.  Skin: Skin is warm and dry.  Psychiatric: She has a normal mood and affect.   Data Reviewed: Chest x-ray reviewed and compared with previous  Assessment:  Post Covid infection Chronic respiratory failure-on oxygen supplementation Hypoxemia Abnormal chest x-ray  Plan/Recommendations:  Patient's chest x-ray does not show significant groundglass changes, shows increased markings  Shortness of breath is associated with tachycardia/bradycardia episodes-more likely that she has cardiac rhythm problems relating to her recent Covid infection.  Pulmonary fibrosis, thrombo-embolic disease is unlikely to present this way  We did discuss a CT, pulmonary function test as further work-up if needed  Cardiac work-up is more relevant to her symptoms at present  I will see her back in about 6 weeks  I did let her know and her daughter who was present to call to let me know if anything changes  I think a CT may show more prominent interstitial markings and PFT may show mild restrictive disease-obtaining this at present does not necessarily provide [place] with any information that helps [place] manage current symptoms   Sherrilyn Rist MD [facility] 06/28/2019, 2:13  PM  CC: Rakes, Connye Burkitt, FNP

## 2019-06-28 NOTE — Patient Instructions (Addendum)
Covid related respiratory failure  Continue oxygen supplementation Continue to monitor your oxygen on a regular basis  Follow-up with cardiology regarding possible cardiac rhythm problems related to Covid  We may obtain a CT scan of the chest Breathing study Following cardiology evaluation  At the present time I am not sure that findings on the CT or breathing study will help move [place] forward  Call with any significant concerns  Continue oxygen use at night  I will see you in about 6 to 8 weeks

## 2019-06-29 ENCOUNTER — Telehealth: Payer: Self-pay | Admitting: Cardiology

## 2019-06-29 ENCOUNTER — Encounter (INDEPENDENT_AMBULATORY_CARE_PROVIDER_SITE_OTHER): Payer: Medicare Other

## 2019-06-29 ENCOUNTER — Ambulatory Visit (INDEPENDENT_AMBULATORY_CARE_PROVIDER_SITE_OTHER): Payer: Medicare Other

## 2019-06-29 DIAGNOSIS — R000 Tachycardia, unspecified: Secondary | ICD-10-CM | POA: Diagnosis not present

## 2019-06-29 DIAGNOSIS — R0602 Shortness of breath: Secondary | ICD-10-CM

## 2019-06-29 NOTE — Telephone Encounter (Signed)
Has not received a call from monitor people about her getting set up

## 2019-06-29 NOTE — Telephone Encounter (Signed)
Pt aware that per preventice site, monitor was delivered today at 328pm - daughter will check when she gets home

## 2019-07-01 ENCOUNTER — Telehealth: Payer: Self-pay | Admitting: Family Medicine

## 2019-07-01 NOTE — Chronic Care Management (AMB) (Signed)
  Chronic Care Management   Outreach Note  07/01/2019 Name: Selena Hunt MRN: [id number] DOB: 1951/03/07  Selena Hunt is a 69 y.o. year old female who is a primary care patient of Rakes, Connye Burkitt, FNP. I reached out to [name] by phone today in response to a referral sent by Ms. Zollie Pee Mcginniss's health plan.     An unsuccessful telephone outreach was attempted today. The patient was referred to the case management team for assistance with care management and care coordination.   Follow Up Plan: A HIPPA compliant phone message was left for the patient providing contact information and requesting a return call. The care management team will reach out to the patient again over the next 7 days.If patient returns call to provider office, please advise to call [facility] at [phone].  [place], [place] [address] Direct Dial: [phone] [email or web address]: [email or web address]

## 2019-07-02 NOTE — Chronic Care Management (AMB) (Signed)
  Chronic Care Management   Note  07/02/2019 Name: Selena Hunt MRN: [id number] DOB: 1950/05/03  [name] is a 68 y.o. year old female who is a primary care patient of Rakes, Connye Burkitt, FNP. I reached out to [name] by phone today in response to a referral sent by [name]'s health plan.     [name] was given information about Chronic Care Management services today including:  1. CCM service includes personalized support from designated clinical staff supervised by her physician, including individualized plan of care and coordination with other care providers 2. 24/7 contact phone numbers for assistance for urgent and routine care needs. 3. Service will only be billed when office clinical staff spend 20 minutes or more in a month to coordinate care. 4. Only one practitioner may furnish and bill the service in a calendar month. 5. The patient may stop CCM services at any time (effective at the end of the month) by phone call to the office staff. 6. The patient will be responsible for cost sharing (co-pay) of up to 20% of the service fee (after annual deductible is met).  Patient agreed to services and verbal consent obtained.   Follow up plan: Telephone appointment with care management team member scheduled for:01/21/2020.  [place], [place] [address] Direct Dial: [phone] [email or web address]: [email or web address]

## 2019-07-04 ENCOUNTER — Other Ambulatory Visit: Payer: Self-pay | Admitting: Family Medicine

## 2019-07-04 DIAGNOSIS — I10 Essential (primary) hypertension: Secondary | ICD-10-CM

## 2019-07-09 ENCOUNTER — Ambulatory Visit: Payer: PRIVATE HEALTH INSURANCE | Attending: Internal Medicine

## 2019-07-09 DIAGNOSIS — Z23 Encounter for immunization: Secondary | ICD-10-CM

## 2019-07-09 NOTE — Progress Notes (Signed)
   Covid-19 Vaccination Clinic  Name:  Selena Hunt    MRN: [id number] DOB: 09-22-1950  07/09/2019  [name] was observed post Covid-19 immunization for 15 minutes without incident. She was provided with Vaccine Information Sheet and instruction to access the V-Safe system.   [name] was instructed to call 911 with any severe reactions post vaccine: Marland Kitchen Difficulty breathing  . Swelling of face and throat  . A fast heartbeat  . A bad rash all over body  . Dizziness and weakness   Immunizations Administered    Name Date Dose VIS Date Route   Moderna COVID-19 Vaccine 07/09/2019  8:10 AM 0.5 mL 03/09/2019 Intramuscular   Manufacturer: Moderna   Lot: [id number]   [id number]

## 2019-07-14 ENCOUNTER — Telehealth: Payer: Self-pay | Admitting: *Deleted

## 2019-07-14 ENCOUNTER — Encounter: Payer: Self-pay | Admitting: Family Medicine

## 2019-07-14 ENCOUNTER — Other Ambulatory Visit: Payer: Self-pay

## 2019-07-14 ENCOUNTER — Ambulatory Visit (INDEPENDENT_AMBULATORY_CARE_PROVIDER_SITE_OTHER): Payer: Medicare Other | Admitting: Family Medicine

## 2019-07-14 VITALS — BP 137/80 | HR 66 | Temp 98.7°F | Ht 61.0 in | Wt 199.0 lb

## 2019-07-14 DIAGNOSIS — R0609 Other forms of dyspnea: Secondary | ICD-10-CM | POA: Insufficient documentation

## 2019-07-14 DIAGNOSIS — N179 Acute kidney failure, unspecified: Secondary | ICD-10-CM | POA: Diagnosis not present

## 2019-07-14 DIAGNOSIS — J9601 Acute respiratory failure with hypoxia: Secondary | ICD-10-CM | POA: Diagnosis not present

## 2019-07-14 DIAGNOSIS — Z8616 Personal history of COVID-19: Secondary | ICD-10-CM | POA: Diagnosis not present

## 2019-07-14 DIAGNOSIS — R0600 Dyspnea, unspecified: Secondary | ICD-10-CM | POA: Diagnosis not present

## 2019-07-14 LAB — BMP8+EGFR
BUN/Creatinine Ratio: 24 (ref 12–28)
BUN: 21 mg/dL (ref 8–27)
CO2: 26 mmol/L (ref 20–29)
Calcium: 9.7 mg/dL (ref 8.7–10.3)
Chloride: 103 mmol/L (ref 96–106)
Creatinine, Ser: 0.89 mg/dL (ref 0.57–1.00)
GFR calc Af Amer: 76 mL/min/{1.73_m2} (ref >59)
GFR calc non Af Amer: 66 mL/min/{1.73_m2} (ref >59)
Glucose: 99 mg/dL (ref 65–99)
Potassium: 4 mmol/L (ref 3.5–5.2)
Sodium: 144 mmol/L (ref 134–144)

## 2019-07-14 MED ORDER — AZITHROMYCIN 250 MG PO TABS: ORAL_TABLET | ORAL | 0 refills | Status: DC
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
## 2019-07-14 NOTE — Telephone Encounter (Signed)
-----   Message from Arnoldo Lenis, MD sent at 07/08/2019  9:39 AM EDT ----- Echo looks good, some mild age related stiffness of the heart muscle that is common but overall normal heart function   Zandra Abts MD

## 2019-07-14 NOTE — Telephone Encounter (Signed)
LM to return call.

## 2019-07-16 NOTE — Telephone Encounter (Signed)
Pt voiced understanding - routed to pcp  

## 2019-07-16 NOTE — Telephone Encounter (Signed)
-----   Message from Arnoldo Lenis, MD sent at 07/08/2019  9:39 AM EDT ----- Echo looks good, some mild age related stiffness of the heart muscle that is common but overall normal heart function   Zandra Abts MD

## 2019-07-21 ENCOUNTER — Telehealth: Payer: Self-pay | Admitting: Family Medicine

## 2019-07-21 MED ORDER — LEVOTHYROXINE SODIUM 100 MCG PO TABS: 100.0000 ug | ORAL_TABLET | Freq: Every day | ORAL | 1 refills | Status: DC

## 2019-07-21 NOTE — Telephone Encounter (Signed)
Can send as reported.

## 2019-07-21 NOTE — Telephone Encounter (Signed)
Pharmacy wants [place] to send knew RX to them on synthroidal patient states she takes 1/2 tablet everyday besides Mondays and Fridays takes a whole one. Please advise

## 2019-07-27 ENCOUNTER — Telehealth: Payer: Self-pay | Admitting: *Deleted

## 2019-07-27 NOTE — Telephone Encounter (Signed)
Pt aware - routed to pcp  

## 2019-07-27 NOTE — Telephone Encounter (Signed)
-----   Message from Arnoldo Lenis, MD sent at 07/26/2019  2:14 PM EDT ----- Normal heart monitor  Zandra Abts MD

## 2019-08-10 ENCOUNTER — Ambulatory Visit: Payer: Medicare Other | Attending: Internal Medicine

## 2019-08-10 DIAGNOSIS — Z23 Encounter for immunization: Secondary | ICD-10-CM

## 2019-08-10 NOTE — Progress Notes (Signed)
   Covid-19 Vaccination Clinic  Name:  Selena Hunt    MRN: [id number] DOB: [birth year]  08/10/2019  [name] was observed post Covid-19 immunization for 15 minutes without incident. She was provided with Vaccine Information Sheet and instruction to access the V-Safe system.   [name] was instructed to call 911 with any severe reactions post vaccine: Marland Kitchen Difficulty breathing  . Swelling of face and throat  . A fast heartbeat  . A bad rash all over body  . Dizziness and weakness   Immunizations Administered    Name Date Dose VIS Date Route   Moderna COVID-19 Vaccine 08/10/2019  8:13 AM 0.5 mL 03/2019 Intramuscular   Manufacturer: Moderna   Lot: [id number]   [id number]

## 2019-08-12 ENCOUNTER — Other Ambulatory Visit: Payer: Self-pay | Admitting: *Deleted

## 2019-08-13 ENCOUNTER — Ambulatory Visit (INDEPENDENT_AMBULATORY_CARE_PROVIDER_SITE_OTHER): Payer: Medicare Other | Admitting: Pulmonary Disease

## 2019-08-13 ENCOUNTER — Other Ambulatory Visit: Payer: Self-pay

## 2019-08-13 ENCOUNTER — Other Ambulatory Visit: Payer: Self-pay | Admitting: Family Medicine

## 2019-08-13 ENCOUNTER — Encounter: Payer: Self-pay | Admitting: Pulmonary Disease

## 2019-08-13 VITALS — BP 138/70 | HR 66 | Temp 97.4°F | Ht 61.0 in | Wt 200.4 lb

## 2019-08-13 DIAGNOSIS — R0602 Shortness of breath: Secondary | ICD-10-CM | POA: Diagnosis not present

## 2019-08-13 MED ORDER — BREO ELLIPTA 200-25 MCG/INH IN AEPB: 1.0000 | INHALATION_SPRAY | Freq: Every day | RESPIRATORY_TRACT | 0 refills | Status: DC

## 2019-08-13 MED ORDER — BREO ELLIPTA 100-25 MCG/INH IN AEPB: 1.0000 | INHALATION_SPRAY | Freq: Every day | RESPIRATORY_TRACT | 5 refills | Status: DC

## 2019-08-13 NOTE — Patient Instructions (Signed)
We will start you on Breo inhaler to be used once a day  Use your nebulizer as needed  Graded exercises as tolerated  Follow-up in 6 weeks  Call with any significant concerns

## 2019-08-13 NOTE — Progress Notes (Signed)
Selena Hunt    [id number]    1950/10/10  Primary Care Physician:Gottschalk, Koleen Distance, DO  Referring Physician: Baruch Gouty, FNP No address on file  Chief complaint:   Occasional desaturations Tachyarrhythmia Recent Covid infection  HPI: She is feeling generally better Still has episodes of desaturations Continues to use oxygen on a regular basis Tries to take breaks of oxygen but with any activity she does desaturate significantly  She has noticed some wheezing on occasions  Past history of lung contusion bilateral lung collapse in 2003 for which she was on ECMO for about 27 days she stated Require a lot of rehab but recovered fully was not on oxygen Was not limited prior to recent Covid diagnosis  She experiences desaturations at rest Some shortness of breath with activity She measures her oxygen on a regular basis She may be sitting down not doing anything, all of a sudden notices that her oxygen levels are low, associated with this is tachycardia/ bradycardia -heart rate will go into the 170s and at least on occasions noted to be in the 30s in a few hours -She is following up with cardiology She has not noticed any progressive symptoms She has an occasional cough She was on DVT prophylaxis and aspirin currently    Outpatient Encounter Medications as of 08/13/2019  Medication Sig  . albuterol (PROVENTIL) (2.5 MG/3ML) 0.083% nebulizer solution Take 3 mLs (2.5 mg total) by nebulization every 6 (six) hours as needed for wheezing or shortness of breath.  Marland Kitchen albuterol (VENTOLIN HFA) 108 (90 Base) MCG/ACT inhaler Inhale 2 puffs into the lungs every 6 (six) hours as needed for wheezing or shortness of breath.  Marland Kitchen aspirin EC 81 MG tablet Take 81 mg by mouth daily.  Marland Kitchen azithromycin (ZITHROMAX Z-PAK) 250 MG tablet As directed  . calcium-vitamin D (OSCAL WITH D) 500-200 MG-UNIT tablet Take 1 tablet by mouth daily with breakfast.  . esomeprazole (NEXIUM) 40 MG capsule  Take 1 capsule (40 mg total) by mouth 2 (two) times daily before a meal.  . fluticasone (FLONASE) 50 MCG/ACT nasal spray Place 2 sprays into both nostrils daily.  . furosemide (LASIX) 20 MG tablet Take 1 tablet (20 mg total) by mouth daily.  Marland Kitchen HYDROcodone-acetaminophen (NORCO/VICODIN) 5-325 MG tablet Take 1 tablet by mouth daily as needed for moderate pain or severe pain.  Marland Kitchen levothyroxine (SYNTHROID) 100 MCG tablet Take 1 tablet (100 mcg total) by mouth daily before breakfast. One whole on MON and FRI and 1/2 tab all other days  . loratadine (CLARITIN) 10 MG tablet Take 1 tablet (10 mg total) by mouth daily.  . meclizine (ANTIVERT) 25 MG tablet Take 1 tablet (25 mg total) by mouth every 8 (eight) hours as needed for dizziness.  . metoprolol tartrate (LOPRESSOR) 50 MG tablet Take 0.5 tablets (25 mg total) by mouth daily.  . Multiple Vitamins-Minerals (WOMENS 50+ ADVANCED PO) Take by mouth.  . oxybutynin (DITROPAN-XL) 10 MG 24 hr tablet TAKE 1 TABLET BY MOUTH AT BEDTIME  . pravastatin (PRAVACHOL) 20 MG tablet TAKE 1 TABLET BY MOUTH EVERY DAY  . Vitamin D, Ergocalciferol, (DRISDOL) 1.25 MG (50000 UT) CAPS capsule TAKE 1 CAPSULE BY MOUTH EVERY 7 DAYS  . [DISCONTINUED] oxybutynin (DITROPAN-XL) 10 MG 24 hr tablet Take 10 mg by mouth at bedtime.   No facility-administered encounter medications on file as of 08/13/2019.    Allergies as of 08/13/2019 - Review Complete 08/13/2019  Allergen Reaction Noted  .  Ambien [zolpidem tartrate] Anxiety 08/27/2012  . Diovan [valsartan] Other (See Comments) 08/27/2012  . Wellbutrin [bupropion] Other (See Comments) 08/27/2012  . Codeine  06/27/2010  . Feldene [piroxicam]  06/27/2010  . Guaifenesin er  06/27/2010  . Latex  06/27/2010  . Mobic [meloxicam] Other (See Comments) 09/13/2013  . Neurontin [gabapentin]  06/27/2010  . Crestor [rosuvastatin calcium] Other (See Comments) 08/07/2015  . Lipitor [atorvastatin calcium] Other (See Comments) 06/27/2010  . Livalo  [pitavastatin] Swelling and Other (See Comments) 09/17/2012  . Simcor [niacin-simvastatin er] Other (See Comments) 08/07/2015  . Zocor [simvastatin] Other (See Comments) 06/27/2010    Past Medical History:  Diagnosis Date  . Benign neoplasm of colon   . Blood transfusion without reported diagnosis    x 34 to date  . Chronic kidney disease   . Colon polyps   . Diverticulitis of colon (without mention of hemorrhage)(562.11)   . Diverticulosis of colon (without mention of hemorrhage)   . Essential hypertension, benign   . Female stress incontinence   . GERD (gastroesophageal reflux disease)   . Hematuria   . Hernia of unspecified site of abdominal cavity without mention of obstruction or gangrene   . History of closed head injury 04/21/2018  . Kidney stones    has had one   . Lumbosacral spondylosis without myelopathy   . Meniere disease    Drs are uncertain of this DX   . Migraine headache   . Neuromuscular disorder ([place])   . Obesity   . Other and unspecified hyperlipidemia   . Pneumonia   . Pulmonary embolism ([place])   . Seizures ([place])    HX of --- from medications,2003  . Stricture and stenosis of esophagus   . Thyroid disease   . Ventral hernia     Past Surgical History:  Procedure Laterality Date  . ABDOMINAL HYSTERECTOMY  1990  . APPENDECTOMY    . [place]   Removed  . FEMORAL VARUS OSTEOTOMY W/ ADDUCTOR RELEASE AND ILIAC CREST BONE GRAFT, PELVIC OSTEOTOMY    . FEMUR FRACTURE SURGERY Left 2003  . HERNIA REPAIR     with mesh, abdominal  . HIP FRACTURE SURGERY Left   . HUMERUS FRACTURE SURGERY Left   . LUMBAR FUSION  1995   Fusion L4 - L5  . PARTIAL COLECTOMY     Secondary to diverticulitis  . TENDON TRANSFER    . TUBAL LIGATION  1972  . ULNAR NERVE REPAIR Left     Family History  Problem Relation Age of Onset  . Alzheimer's disease Mother   . Hypertension Mother   . Stroke Mother   . Alzheimer's disease Father   . Hypertension Father   .  Gout Brother   . Fibromyalgia Daughter   . Hypertension Son   . Depression Son   . Spondylitis Son        spondylosis  . GER disease Son   . Heart disease Maternal Grandmother   . Heart disease Maternal Grandfather   . Congestive Heart Failure Maternal Grandfather   . Stroke Paternal Grandmother   . Hypertension Paternal Grandmother   . Depression Paternal Grandfather   . Suicidality Paternal Grandfather   . Colon cancer Neg Hx   . Esophageal cancer Neg Hx   . Pancreatic cancer Neg Hx   . Stomach cancer Neg Hx   . Liver disease Neg Hx     Social History   Socioeconomic History  . Marital status: Widowed  Spouse name: Vianna Louden  . Number of children: 2  . Years of education: Not on file  . Highest education level: 12th grade  Occupational History  . 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 Smoking status: Never Smoker  . Smokeless tobacco: Never Used  Substance and Sexual Activity  . Alcohol use: Not Currently    Alcohol/week: 0.0 standard drinks    Comment: wine once or twice a year  . Drug use: No  . Sexual activity: Never    Birth control/protection: Post-menopausal  Other Topics Concern  . Not on file  Social History Narrative   Lives alone.    Caffeine 2 cups daily    Right handed   Social Determinants of Health   Financial Resource Strain:   . Difficulty of Paying Living Expenses:   Food Insecurity:   . Worried About Charity fundraiser in the Last Year:   . Arboriculturist in the Last Year:   Transportation Needs:   . Film/video editor (Medical):   Marland Kitchen Lack of Transportation (Non-Medical):   Physical Activity:   . Days of Exercise per Week:   . Minutes of Exercise per Session:   Stress:   . Feeling of Stress :   Social Connections:   . Frequency of Communication with Friends and Family:   . Frequency of Social Gatherings with Friends and Family:   . Attends Religious Services:   . Active  Member of Clubs or Organizations:   . Attends Archivist Meetings:   Marland Kitchen Marital Status:   Intimate Partner Violence:   . Fear of Current or Ex-Partner:   . Emotionally Abused:   Marland Kitchen Physically Abused:   . Sexually Abused:     Review of Systems  Respiratory: Positive for cough, shortness of breath and wheezing.     Vitals:   08/13/19 1117  BP: 138/70  Pulse: 66  Temp: (!) 97.4 F (36.3 C)  SpO2: 95%     Physical Exam  Constitutional: She appears well-developed and well-nourished.  HENT:  Head: Normocephalic and atraumatic.  Eyes: Pupils are equal, round, and reactive to light.  Neck: No tracheal deviation present. No thyromegaly present.  Cardiovascular: Normal rate and regular rhythm.  Pulmonary/Chest: Effort normal. No respiratory distress. She has wheezes. She has no rales. She exhibits no tenderness.  Musculoskeletal:        General: Normal range of motion.     Cervical back: Normal range of motion and neck supple.  Neurological: She is alert.  Skin: Skin is warm and dry.  Psychiatric: She has a normal mood and affect.   Data Reviewed: Chest x-ray reviewed and compared with previous  Recent echocardiogram within normal limits-reviewed-reviewed with the patient  Assessment:  Post Covid infection Chronic respiratory failure-on oxygen supplementation Hypoxemia Abnormal chest x-ray Wheezing, bronchospasms  Plan/Recommendations:  Add Breo 100  Encouraged to use nebulizer regularly as needed.  Importance of graded exercises discussed  Patient's chest x-ray does not show significant groundglass changes, shows increased markings  Pulmonary fibrosis, thrombo-embolic disease is unlikely to present this way  I will see her back in about 2 months  I think a CT may show more prominent interstitial markings and PFT may show mild restrictive disease-obtaining this at present does not necessarily provide [place] with any information that helps [place] manage current  symptoms   Sherrilyn Rist MD [facility] 08/13/2019, 11:33 AM  CC: Baruch Gouty, FNP

## 2019-08-27 ENCOUNTER — Ambulatory Visit: Payer: PRIVATE HEALTH INSURANCE | Admitting: Pulmonary Disease

## 2019-08-27 ENCOUNTER — Other Ambulatory Visit: Payer: Self-pay | Admitting: Family Medicine

## 2019-08-30 ENCOUNTER — Ambulatory Visit: Payer: PRIVATE HEALTH INSURANCE | Admitting: Cardiology

## 2019-09-08 ENCOUNTER — Ambulatory Visit (INDEPENDENT_AMBULATORY_CARE_PROVIDER_SITE_OTHER): Payer: Medicare Other | Admitting: Family Medicine

## 2019-09-08 ENCOUNTER — Telehealth: Payer: Self-pay | Admitting: Pharmacist

## 2019-09-08 ENCOUNTER — Encounter: Payer: Self-pay | Admitting: Family Medicine

## 2019-09-08 ENCOUNTER — Other Ambulatory Visit: Payer: Self-pay

## 2019-09-08 VITALS — BP 135/78 | HR 73 | Temp 97.0°F | Wt 200.0 lb

## 2019-09-08 DIAGNOSIS — R600 Localized edema: Secondary | ICD-10-CM | POA: Diagnosis not present

## 2019-09-08 DIAGNOSIS — J9691 Respiratory failure, unspecified with hypoxia: Secondary | ICD-10-CM

## 2019-09-08 DIAGNOSIS — G8921 Chronic pain due to trauma: Secondary | ICD-10-CM | POA: Diagnosis not present

## 2019-09-08 DIAGNOSIS — J9801 Acute bronchospasm: Secondary | ICD-10-CM

## 2019-09-08 DIAGNOSIS — E034 Atrophy of thyroid (acquired): Secondary | ICD-10-CM

## 2019-09-08 DIAGNOSIS — N393 Stress incontinence (female) (male): Secondary | ICD-10-CM

## 2019-09-08 DIAGNOSIS — Z79899 Other long term (current) drug therapy: Secondary | ICD-10-CM

## 2019-09-08 DIAGNOSIS — M6289 Other specified disorders of muscle: Secondary | ICD-10-CM | POA: Insufficient documentation

## 2019-09-08 MED ORDER — DULERA 100-5 MCG/ACT IN AERO: 2.0000 | INHALATION_SPRAY | Freq: Two times a day (BID) | RESPIRATORY_TRACT | 1 refills | Status: DC

## 2019-09-08 MED ORDER — SYNTHROID 100 MCG PO TABS: ORAL_TABLET | ORAL | 1 refills | Status: DC

## 2019-09-08 MED ORDER — TRIAMTERENE-HCTZ 37.5-25 MG PO TABS: 1.0000 | ORAL_TABLET | Freq: Every day | ORAL | 0 refills | Status: DC

## 2019-09-08 MED ORDER — HYDROCODONE-ACETAMINOPHEN 5-325 MG PO TABS: 1.0000 | ORAL_TABLET | Freq: Every day | ORAL | 0 refills | Status: DC | PRN

## 2019-09-08 MED ORDER — LEVOTHYROXINE SODIUM 100 MCG PO TABS: ORAL_TABLET | ORAL | 1 refills | Status: DC

## 2019-09-08 NOTE — Telephone Encounter (Signed)
Dulera 162mcg/5 inhaler sample given to patient [id number] EXP 08/28/20
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
## 2019-09-08 NOTE — Patient Instructions (Signed)
STOP breo.  Trial of Dulera.  Norco sent to pharmacy  Triamterene/ HCTZ sent.  Come back in 10 days for recheck blood.  No appt needed for that.

## 2019-09-09 ENCOUNTER — Encounter: Payer: Self-pay | Admitting: Cardiology

## 2019-09-09 ENCOUNTER — Ambulatory Visit (INDEPENDENT_AMBULATORY_CARE_PROVIDER_SITE_OTHER): Payer: Medicare Other | Admitting: Cardiology

## 2019-09-09 VITALS — BP 124/82 | HR 59 | Ht 61.0 in | Wt 201.8 lb

## 2019-09-09 DIAGNOSIS — I890 Lymphedema, not elsewhere classified: Secondary | ICD-10-CM | POA: Diagnosis not present

## 2019-09-09 DIAGNOSIS — R0602 Shortness of breath: Secondary | ICD-10-CM

## 2019-09-09 DIAGNOSIS — J9610 Chronic respiratory failure, unspecified whether with hypoxia or hypercapnia: Secondary | ICD-10-CM

## 2019-09-09 DIAGNOSIS — R600 Localized edema: Secondary | ICD-10-CM | POA: Diagnosis not present

## 2019-09-09 NOTE — Progress Notes (Signed)
Clinical Summary Selena Hunt is a 69 y.o.female  1. History of SOB/Arm pain - was evaluated by Dr Percival Spanish in 2017 - 09/2015 nuclear stress no ischemia     2. COVID 19 infection in Dec 2020/SOB - has been on home O2  06/04/19 CXR stable intersitial densities, most conistent with scarring - remains on 2L [place] - DOE just walking in from parking lot - has appt with pulmonary - she reports chronic left sided swelling. Some increase in bilateral swelling - some AKI, diuretic was lowered. Takes lasix 20mg  qod. She is off maxzide.  12/2018 weight 194 lbs precovid, today she is 197 lbs - prior to covid worked out at [employer] without any symptoms, since ongoing DOE remains on 2L [place]   06/2019 echo LVEF [id number], grade I diastolic dysfunction, nomral RV, mild MR, - ongoing SOB, O2 requirement.    3.Labile heart rates - she reports up and down heart rates. Can be as low 35, as high as 140s. One time up to 190s. She has been monitoring with her pulse oximeter - isoalted episode of palpitations.   06/2019 heart monitor no arrhythmias - no recent palpitations.   4. LE edema - patient reported less response to lasix 20mg  vs HCTZ, pcp changed her to maxide just recently.  - lasix caused dramatic diuretic response. Does better with HCTZ.  - prior left leg injury   Past Medical History:  Diagnosis Date  . Benign neoplasm of colon   . Blood transfusion without reported diagnosis    x 34 to date  . Chronic kidney disease   . Colon polyps   . Diverticulitis of colon (without mention of hemorrhage)(562.11)   . Diverticulosis of colon (without mention of hemorrhage)   . Essential hypertension, benign   . Female stress incontinence   . GERD (gastroesophageal reflux disease)   . Hematuria   . Hernia of unspecified site of abdominal cavity without mention of obstruction or gangrene   . History of closed head injury 04/21/2018  . Kidney stones    has had one   . Lumbosacral  spondylosis without myelopathy   . Meniere disease    Drs are uncertain of this DX   . Migraine headache   . Neuromuscular disorder ([place])   . Obesity   . Other and unspecified hyperlipidemia   . Pneumonia   . Pulmonary embolism ([place])   . Seizures ([place])    HX of --- from medications,2003  . Stricture and stenosis of esophagus   . Thyroid disease   . Ventral hernia      Allergies  Allergen Reactions  . Ambien [Zolpidem Tartrate] Anxiety  . Diovan [Valsartan] Other (See Comments)    Seizures.  . Wellbutrin [Bupropion] Other (See Comments)    Seizures.  . Codeine     Feel crazy and dizzy   . Feldene [Piroxicam]     Swelling   . Guaifenesin Er     Raise Bp / HR   . Latex     Swelling   . Mobic [Meloxicam] Other (See Comments)    Stomach cramps  . Neurontin [Gabapentin]     "loopy"  . Crestor [Rosuvastatin Calcium] Other (See Comments)    Myalgias   . Lipitor [Atorvastatin Calcium] Other (See Comments)    myalgias  . Livalo [Pitavastatin] Swelling and Other (See Comments)    stomach cramping   . Simcor [Niacin-Simvastatin Er] Other (See Comments)    Stomach cramps  .  Zocor [Simvastatin] Other (See Comments)    Stomach cramps     Current Outpatient Medications  Medication Sig Dispense Refill  . albuterol (PROVENTIL) (2.5 MG/3ML) 0.083% nebulizer solution Take 3 mLs (2.5 mg total) by nebulization every 6 (six) hours as needed for wheezing or shortness of breath. 150 mL 1  . albuterol (VENTOLIN HFA) 108 (90 Base) MCG/ACT inhaler Inhale 2 puffs into the lungs every 6 (six) hours as needed for wheezing or shortness of breath. 8 g 0  . aspirin EC 81 MG tablet Take 81 mg by mouth daily.    . calcium-vitamin D (OSCAL WITH D) 500-200 MG-UNIT tablet Take 1 tablet by mouth daily with breakfast. 90 tablet 1  . esomeprazole (NEXIUM) 40 MG capsule Take 1 capsule (40 mg total) by mouth 2 (two) times daily before a meal. 60 capsule 3  . fluticasone (FLONASE) 50 MCG/ACT nasal spray  Place 2 sprays into both nostrils daily. 16 g 6  . HYDROcodone-acetaminophen (NORCO/VICODIN) 5-325 MG tablet Take 1 tablet by mouth daily as needed for moderate pain or severe pain. 30 tablet 0  . loratadine (CLARITIN) 10 MG tablet Take 1 tablet (10 mg total) by mouth daily. 30 tablet 11  . meclizine (ANTIVERT) 25 MG tablet Take 1 tablet (25 mg total) by mouth every 8 (eight) hours as needed for dizziness. 30 tablet 2  . metoprolol tartrate (LOPRESSOR) 50 MG tablet Take 0.5 tablets (25 mg total) by mouth daily. 45 tablet 1  . mometasone-formoterol (DULERA) 100-5 MCG/ACT AERO Inhale 2 puffs into the lungs in the morning and at bedtime. (Patient taking differently: Inhale 2 puffs into the lungs in the morning and at bedtime. [id number] EXP2/23/22) 13 g 1  . Multiple Vitamins-Minerals (WOMENS 50+ ADVANCED PO) Take by mouth.    . oxybutynin (DITROPAN-XL) 10 MG 24 hr tablet TAKE 1 TABLET BY MOUTH AT BEDTIME 90 tablet 0  . pravastatin (PRAVACHOL) 20 MG tablet TAKE 1 TABLET BY MOUTH EVERY DAY 90 tablet 0  . SYNTHROID 100 MCG tablet One whole tablet by mouth on MON and FRI and 1/2 tab all other days 60 tablet 1  . triamterene-hydrochlorothiazide (MAXZIDE-25) 37.5-25 MG tablet Take 1 tablet by mouth daily. STOP lasix 90 tablet 0  . Vitamin D, Ergocalciferol, (DRISDOL) 1.25 MG (50000 UT) CAPS capsule TAKE 1 CAPSULE BY MOUTH EVERY 7 DAYS 12 capsule 3   No current facility-administered medications for this visit.     Past Surgical History:  Procedure Laterality Date  . ABDOMINAL HYSTERECTOMY  1990  . APPENDECTOMY    . [place]   Removed  . FEMORAL VARUS OSTEOTOMY W/ ADDUCTOR RELEASE AND ILIAC CREST BONE GRAFT, PELVIC OSTEOTOMY    . FEMUR FRACTURE SURGERY Left 2003  . HERNIA REPAIR     with mesh, abdominal  . HIP FRACTURE SURGERY Left   . HUMERUS FRACTURE SURGERY Left   . LUMBAR FUSION  1995   Fusion L4 - L5  . PARTIAL COLECTOMY     Secondary to diverticulitis  . TENDON TRANSFER    .  TUBAL LIGATION  1972  . ULNAR NERVE REPAIR Left      Allergies  Allergen Reactions  . Ambien [Zolpidem Tartrate] Anxiety  . Diovan [Valsartan] Other (See Comments)    Seizures.  . Wellbutrin [Bupropion] Other (See Comments)    Seizures.  . Codeine     Feel crazy and dizzy   . Feldene [Piroxicam]     Swelling   . Guaifenesin  Er     Raise Bp / HR   . Latex     Swelling   . Mobic [Meloxicam] Other (See Comments)    Stomach cramps  . Neurontin [Gabapentin]     "loopy"  . Crestor [Rosuvastatin Calcium] Other (See Comments)    Myalgias   . Lipitor [Atorvastatin Calcium] Other (See Comments)    myalgias  . Livalo [Pitavastatin] Swelling and Other (See Comments)    stomach cramping   . Simcor [Niacin-Simvastatin Er] Other (See Comments)    Stomach cramps  . Zocor [Simvastatin] Other (See Comments)    Stomach cramps      Family History  Problem Relation Age of Onset  . Alzheimer's disease Mother   . Hypertension Mother   . Stroke Mother   . Alzheimer's disease Father   . Hypertension Father   . Gout Brother   . Fibromyalgia Daughter   . Hypertension Son   . Depression Son   . Spondylitis Son        spondylosis  . GER disease Son   . Heart disease Maternal Grandmother   . Heart disease Maternal Grandfather   . Congestive Heart Failure Maternal Grandfather   . Stroke Paternal Grandmother   . Hypertension Paternal Grandmother   . Depression Paternal Grandfather   . Suicidality Paternal Grandfather   . Colon cancer Neg Hx   . Esophageal cancer Neg Hx   . Pancreatic cancer Neg Hx   . Stomach cancer Neg Hx   . Liver disease Neg Hx      Social History Selena Hunt reports that she has never smoked. She has never used smokeless tobacco. Selena Hunt reports previous alcohol use.   Review of Systems CONSTITUTIONAL: No weight loss, fever, chills, weakness or fatigue.  HEENT: Eyes: No visual loss, blurred vision, double vision or yellow sclerae.No hearing loss,  sneezing, congestion, runny nose or sore throat.  SKIN: No rash or itching.  CARDIOVASCULAR: per hpi RESPIRATORY: per hpi GASTROINTESTINAL: No anorexia, nausea, vomiting or diarrhea. No abdominal pain or blood.  GENITOURINARY: No burning on urination, no polyuria NEUROLOGICAL: No headache, dizziness, syncope, paralysis, ataxia, numbness or tingling in the extremities. No change in bowel or bladder control.  MUSCULOSKELETAL: No muscle, back pain, joint pain or stiffness.  LYMPHATICS: No enlarged nodes. No history of splenectomy.  PSYCHIATRIC: No history of depression or anxiety.  ENDOCRINOLOGIC: No reports of sweating, cold or heat intolerance. No polyuria or polydipsia.  Marland Kitchen   Physical Examination Today's Vitals   09/09/19 1417  BP: 124/82  Pulse: (!) 59  SpO2: 95%  Weight: 201 lb 12.8 oz (91.5 kg)  Height: 5\' 1"  (1.549 m)   Body mass index is 38.13 kg/m.  Gen: resting comfortably, no acute distress HEENT: no scleral icterus, pupils equal round and reactive, no palptable cervical adenopathy,  CV: RRR,no m/r/g, no jvd Resp: Clear to auscultation bilaterally GI: abdomen is soft, non-tender, non-distended, normal bowel sounds, no hepatosplenomegaly MSK: extremities are warm, 2+ bilateral LE edema Skin: warm, no rash Neuro:  no focal deficits Psych: appropriate affect   Diagnostic Studies  06/2019 echo IMPRESSIONS    1. Left ventricular ejection fraction, by estimation, is 60 to 65%. The  left ventricle has normal function. The left ventricle has no regional  wall motion abnormalities. There is mild concentric left ventricular  hypertrophy. Left ventricular diastolic  parameters are consistent with Grade I diastolic dysfunction (impaired  relaxation).  2. Right ventricular systolic function is normal. The right ventricular  size is normal. There is mildly elevated pulmonary artery systolic  pressure.  3. The mitral valve is grossly normal. Mild mitral valve  regurgitation.  4. The aortic valve is tricuspid. Aortic valve regurgitation is not  visualized. Mild aortic valve sclerosis is present, with no evidence of  aortic valve stenosis.  5. The inferior vena cava is normal in size with greater than 50%  respiratory variability, suggesting right atrial pressure of 3 mmHg.    06/2019 heart monitor  7 day event monitor  MiN HR 52, Max HR 127, Avg HR 70  Reported symptoms correlate with normal sinus rhythm  No significant arrhythmias   Assessment and Plan  1. SOB/DOE - ongoing symptoms since COVID infection in Dec, prior to that was working out at [employer] without any issues. Remains on 2L , ongoing issues with hypoxia - benign echo other than mild diastolic dysfunction - did not tolerate lasix, continue maxide.  - refer to pulmonary rehab for chronic respiratory failure.   2. Labile heart rates - she had noted previously by her home monitor - by event monitor normal HRs, no arrhythmias - no further workup    3. Lower extremity edema - combined etiology likely from diastolic dysfunction and suspect lymphedema based on appearance - continue maxide, refer to lymphedema clinic       Arnoldo Lenis, M.D.

## 2019-09-09 NOTE — Patient Instructions (Addendum)
Medication Instructions:  Continue all current medications.  Labwork: none  Testing/Procedures: none  Follow-Up: 6 months   Any Other Special Instructions Will Be Listed Below (If Applicable).  You have been referred to:  Pulmonary Rehab at [facility] have been referred to:  Lymphedema clinic - [place]   If you need a refill on your cardiac medications before your next appointment, please call your pharmacy.

## 2019-09-10 ENCOUNTER — Telehealth: Payer: Self-pay

## 2019-09-10 ENCOUNTER — Other Ambulatory Visit: Payer: Self-pay | Admitting: *Deleted

## 2019-09-10 DIAGNOSIS — I890 Lymphedema, not elsewhere classified: Secondary | ICD-10-CM

## 2019-09-10 NOTE — Telephone Encounter (Signed)
I changed the order earlier today.

## 2019-09-11 LAB — TOXASSURE SELECT 13 (MW), URINE

## 2019-09-18 DIAGNOSIS — U071 COVID-19: Secondary | ICD-10-CM | POA: Diagnosis not present

## 2019-09-18 DIAGNOSIS — J1282 Pneumonia due to coronavirus disease 2019: Secondary | ICD-10-CM | POA: Diagnosis not present

## 2019-09-20 ENCOUNTER — Ambulatory Visit (INDEPENDENT_AMBULATORY_CARE_PROVIDER_SITE_OTHER): Payer: Medicare Other | Admitting: Pulmonary Disease

## 2019-09-20 ENCOUNTER — Encounter: Payer: Self-pay | Admitting: Pulmonary Disease

## 2019-09-20 ENCOUNTER — Telehealth: Payer: Self-pay | Admitting: Family Medicine

## 2019-09-20 ENCOUNTER — Other Ambulatory Visit: Payer: Self-pay

## 2019-09-20 VITALS — BP 124/76 | HR 68 | Temp 98.3°F | Ht 61.0 in | Wt 195.6 lb

## 2019-09-20 DIAGNOSIS — R0609 Other forms of dyspnea: Secondary | ICD-10-CM

## 2019-09-20 DIAGNOSIS — R0600 Dyspnea, unspecified: Secondary | ICD-10-CM | POA: Diagnosis not present

## 2019-09-20 DIAGNOSIS — R0602 Shortness of breath: Secondary | ICD-10-CM

## 2019-09-20 NOTE — Progress Notes (Signed)
Selena Hunt    [id number]    10-01-1950  Primary Care Physician:Gottschalk, Koleen Distance, DO  Referring Physician: Janora Norlander, DO [address] [place],  [place] [address]  Chief complaint:   Gradually feeling better Recent Covid infection  HPI: She is feeling generally better Still has episodes of desaturations Continues to use oxygen on a regular basis Tries to take breaks of oxygen but with any activity she does desaturate significantly  Occasional chest discomfort with deep breaths Continue to encourage graded exercises  She has noticed some wheezing on occasions  Past history of lung contusion bilateral lung collapse in 2003 for which she was on ECMO for about 27 days she stated Require a lot of rehab but recovered fully was not on oxygen Was not limited prior to recent Covid diagnosis  She measures her oxygen on a regular basis She may be sitting down not doing anything, all of a sudden notices that her oxygen levels are low, associated with this is tachycardia/ bradycardia -heart rate will go into the 170s and at least on occasions noted to be in the 30s in a few hours -She is following up with cardiology She has not noticed any progressive symptoms She has an occasional cough She was on DVT prophylaxis and aspirin currently  Concern about lymphedema  Outpatient Encounter Medications as of 09/20/2019  Medication Sig   albuterol (PROVENTIL) (2.5 MG/3ML) 0.083% nebulizer solution Take 3 mLs (2.5 mg total) by nebulization every 6 (six) hours as needed for wheezing or shortness of breath.   albuterol (VENTOLIN HFA) 108 (90 Base) MCG/ACT inhaler Inhale 2 puffs into the lungs every 6 (six) hours as needed for wheezing or shortness of breath.   aspirin EC 81 MG tablet Take 81 mg by mouth daily.   calcium-vitamin D (OSCAL WITH D) 500-200 MG-UNIT tablet Take 1 tablet by mouth daily with breakfast.   esomeprazole (NEXIUM) 40 MG capsule Take 1 capsule (40 mg  total) by mouth 2 (two) times daily before a meal.   fluticasone (FLONASE) 50 MCG/ACT nasal spray Place 2 sprays into both nostrils daily.   HYDROcodone-acetaminophen (NORCO/VICODIN) 5-325 MG tablet Take 1 tablet by mouth daily as needed for moderate pain or severe pain.   loratadine (CLARITIN) 10 MG tablet Take 1 tablet (10 mg total) by mouth daily.   meclizine (ANTIVERT) 25 MG tablet Take 1 tablet (25 mg total) by mouth every 8 (eight) hours as needed for dizziness.   metoprolol tartrate (LOPRESSOR) 50 MG tablet Take 0.5 tablets (25 mg total) by mouth daily.   Multiple Vitamins-Minerals (WOMENS 50+ ADVANCED PO) Take by mouth.   oxybutynin (DITROPAN-XL) 10 MG 24 hr tablet TAKE 1 TABLET BY MOUTH AT BEDTIME   pravastatin (PRAVACHOL) 20 MG tablet TAKE 1 TABLET BY MOUTH EVERY DAY   SYNTHROID 100 MCG tablet One whole tablet by mouth on MON and FRI and 1/2 tab all other days   triamterene-hydrochlorothiazide (MAXZIDE-25) 37.5-25 MG tablet Take 1 tablet by mouth daily. STOP lasix   Vitamin D, Ergocalciferol, (DRISDOL) 1.25 MG (50000 UT) CAPS capsule TAKE 1 CAPSULE BY MOUTH EVERY 7 DAYS   [DISCONTINUED] mometasone-formoterol (DULERA) 100-5 MCG/ACT AERO Inhale 2 puffs into the lungs in the morning and at bedtime. (Patient taking differently: Inhale 2 puffs into the lungs in the morning and at bedtime. LOT#[id number] EXP2/23/22)   No facility-administered encounter medications on file as of 09/20/2019.    Allergies as of 09/20/2019 -  Review Complete 09/20/2019  Allergen Reaction Noted   Ambien [zolpidem tartrate] Anxiety 08/27/2012   Diovan [valsartan] Other (See Comments) 08/27/2012   Wellbutrin [bupropion] Other (See Comments) 08/27/2012   Codeine  06/27/2010   Feldene [piroxicam]  06/27/2010   Guaifenesin er  06/27/2010   Latex  06/27/2010   Mobic [meloxicam] Other (See Comments) 09/13/2013   Neurontin [gabapentin]  06/27/2010   Crestor [rosuvastatin calcium] Other (See  Comments) 08/07/2015   Lipitor [atorvastatin calcium] Other (See Comments) 06/27/2010   Livalo [pitavastatin] Swelling and Other (See Comments) 09/17/2012   Simcor [niacin-simvastatin er] Other (See Comments) 08/07/2015   Zocor [simvastatin] Other (See Comments) 06/27/2010    Past Medical History:  Diagnosis Date   Benign neoplasm of colon    Blood transfusion without reported diagnosis    x 34 to date   Chronic kidney disease    Colon polyps    Diverticulitis of colon (without mention of hemorrhage)(562.11)    Diverticulosis of colon (without mention of hemorrhage)    Essential hypertension, benign    Female stress incontinence    GERD (gastroesophageal reflux disease)    Hematuria    Hernia of unspecified site of abdominal cavity without mention of obstruction or gangrene    History of closed head injury 04/21/2018   Kidney stones    has had one    Lumbosacral spondylosis without myelopathy    Meniere disease    Drs are uncertain of this DX    Migraine headache    Neuromuscular disorder ([place])    Obesity    Other and unspecified hyperlipidemia    Pneumonia    Pulmonary embolism ([facility])    Seizures ([place])    HX of --- from medications,2003   Stricture and stenosis of esophagus    Thyroid disease    Ventral hernia     Past Surgical History:  Procedure Laterality Date   ABDOMINAL HYSTERECTOMY  1990   APPENDECTOMY     COLON SURGERY  1997   Removed   FEMORAL VARUS OSTEOTOMY W/ ADDUCTOR RELEASE AND ILIAC CREST BONE GRAFT, PELVIC OSTEOTOMY     FEMUR FRACTURE SURGERY Left 2003   HERNIA REPAIR     with mesh, abdominal   HIP FRACTURE SURGERY Left    HUMERUS FRACTURE SURGERY Left    LUMBAR FUSION  1995   Fusion L4 - L5   PARTIAL COLECTOMY     Secondary to diverticulitis   TENDON TRANSFER     TUBAL LIGATION  1972   ULNAR NERVE REPAIR Left     Family History  Problem Relation Age of Onset   Alzheimer's disease Mother     Hypertension Mother    Stroke Mother    Alzheimer's disease Father    Hypertension Father    Gout Brother    Fibromyalgia Daughter    Hypertension Son    Depression Son    Spondylitis Son        spondylosis   GER disease Son    Heart disease Maternal Grandmother    Heart disease Maternal Grandfather    Congestive Heart Failure Maternal Grandfather    Stroke Paternal Grandmother    Hypertension Paternal Grandmother    Depression Paternal Grandfather    Suicidality Paternal Grandfather    Colon cancer Neg Hx    Esophageal cancer Neg Hx    Pancreatic cancer Neg Hx    Stomach cancer Neg Hx    Liver disease Neg Hx     Social History  Socioeconomic History   Marital status: Widowed    Spouse name: Anwyn Kriegel   Number of children: 2   Years of education: Not on file   Highest education level: 12th grade  Occupational History   Occupation: Retired    Fish farm manager: Engineer, [employer]    Comment: 2005   Tobacco Use   Smoking status: Never Smoker   Smokeless tobacco: Never Used  Scientific laboratory technician Use: Never used  Substance and Sexual Activity   Alcohol use: Not Currently    Alcohol/week: 0.0 standard drinks    Comment: wine once or twice a year   Drug use: No   Sexual activity: Never    Birth control/protection: Post-menopausal  Other Topics Concern   Not on file  Social History Narrative   Lives alone.    Caffeine 2 cups daily    Right handed   Social Determinants of Health   Financial Resource Strain:    Difficulty of Paying Living Expenses:   Food Insecurity:    Worried About Charity fundraiser in the Last Year:    Arboriculturist in the Last Year:   Transportation Needs:    Film/video editor (Medical):    Lack of Transportation (Non-Medical):   Physical Activity:    Days of Exercise per Week:    Minutes of Exercise per Session:   Stress:    Feeling of Stress :   Social Connections:    Frequency of  Communication with Friends and Family:    Frequency of Social Gatherings with Friends and Family:    Attends Religious Services:    Active Member of Clubs or Organizations:    Attends Music therapist:    Marital Status:   Intimate Partner Violence:    Fear of Current or Ex-Partner:    Emotionally Abused:    Physically Abused:    Sexually Abused:     Review of Systems  HENT:       Some sinus fullness  Respiratory: Positive for cough, shortness of breath and wheezing.     Vitals:   09/20/19 0947  BP: 124/76  Pulse: 68  Temp: 98.3 F (36.8 C)  SpO2: 97%     Physical Exam Constitutional:      Appearance: She is well-developed.  HENT:     Head: Normocephalic and atraumatic.  Eyes:     Pupils: Pupils are equal, round, and reactive to light.  Neck:     Thyroid: No thyromegaly.     Trachea: No tracheal deviation.  Cardiovascular:     Rate and Rhythm: Normal rate and regular rhythm.  Pulmonary:     Effort: Pulmonary effort is normal. No respiratory distress.     Breath sounds: No stridor. No wheezing, rhonchi or rales.  Chest:     Chest wall: No tenderness.  Musculoskeletal:     Cervical back: No rigidity or tenderness.  Neurological:     Mental Status: She is alert.    Data Reviewed: Chest x-ray reviewed and compared with previous  Recent echocardiogram within normal limits-reviewed-reviewed with the patient  Assessment:  Post Covid infection Chronic respiratory failure-on oxygen supplementation Hypoxemia Abnormal chest x-ray  Not using nebulizations as frequently Continues on Breo  Plan/Recommendations:  Add Breo 100  Nebulizations as needed Importance of graded exercises discussed  We will follow-up in about 3 months  Pulmonary rehab may be beneficial-has a referral for same  No other testing at present Continue monitoring oxygen  on a regular basis  Sherrilyn Rist MD [facility] 09/20/2019,  10:06 AM  CC: Janora Norlander, DO

## 2019-09-20 NOTE — Patient Instructions (Signed)
I will see you back in 3 months  Continue oxygen supplementation Graded exercises as you can tolerate  Pulmonary rehab will be beneficial  Call with significant concerns

## 2019-09-22 ENCOUNTER — Telehealth: Payer: Self-pay | Admitting: Family Medicine

## 2019-09-22 NOTE — Telephone Encounter (Signed)
Spoke to patient - I believe that it was a reminder that she is due in near future for mammogram (screening) and pervious order was signed for her additional imaging last year by Dr. Warrick Parisian as her PCP was probably out of the office at the time.

## 2019-09-22 NOTE — Telephone Encounter (Signed)
Called and spoke to patient - see previous note in chart

## 2019-09-27 ENCOUNTER — Other Ambulatory Visit: Payer: Self-pay

## 2019-09-27 ENCOUNTER — Other Ambulatory Visit: Payer: Medicare Other

## 2019-09-27 DIAGNOSIS — E034 Atrophy of thyroid (acquired): Secondary | ICD-10-CM

## 2019-09-27 DIAGNOSIS — R600 Localized edema: Secondary | ICD-10-CM | POA: Diagnosis not present

## 2019-09-28 LAB — BASIC METABOLIC PANEL
BUN/Creatinine Ratio: 24 (ref 12–28)
BUN: 27 mg/dL (ref 8–27)
CO2: 26 mmol/L (ref 20–29)
Calcium: 10.4 mg/dL — ABNORMAL HIGH (ref 8.7–10.3)
Chloride: 103 mmol/L (ref 96–106)
Creatinine, Ser: 1.14 mg/dL — ABNORMAL HIGH (ref 0.57–1.00)
GFR calc Af Amer: 56 mL/min/{1.73_m2} — ABNORMAL LOW (ref >59)
GFR calc non Af Amer: 49 mL/min/{1.73_m2} — ABNORMAL LOW (ref >59)
Glucose: 106 mg/dL — ABNORMAL HIGH (ref 65–99)
Potassium: 4 mmol/L (ref 3.5–5.2)
Sodium: 142 mmol/L (ref 134–144)

## 2019-09-28 LAB — THYROID PANEL WITH TSH
Free Thyroxine Index: 1.8 (ref 1.2–4.9)
T3 Uptake Ratio: 26 % (ref 24–39)
T4, Total: 7 ug/dL (ref 4.5–12.0)
TSH: 2.45 u[IU]/mL (ref 0.450–4.500)

## 2019-10-05 ENCOUNTER — Encounter (HOSPITAL_COMMUNITY): Payer: Self-pay | Admitting: Physical Therapy

## 2019-10-05 ENCOUNTER — Other Ambulatory Visit: Payer: Self-pay

## 2019-10-05 ENCOUNTER — Ambulatory Visit (HOSPITAL_COMMUNITY): Payer: Medicare Other | Attending: Cardiology | Admitting: Physical Therapy

## 2019-10-05 DIAGNOSIS — I890 Lymphedema, not elsewhere classified: Secondary | ICD-10-CM | POA: Diagnosis not present
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
## 2019-10-08 ENCOUNTER — Ambulatory Visit (HOSPITAL_COMMUNITY): Payer: Medicare Other | Attending: Cardiology

## 2019-10-08 ENCOUNTER — Encounter (HOSPITAL_COMMUNITY): Payer: Self-pay

## 2019-10-08 ENCOUNTER — Other Ambulatory Visit: Payer: Self-pay

## 2019-10-08 DIAGNOSIS — I890 Lymphedema, not elsewhere classified: Secondary | ICD-10-CM | POA: Diagnosis not present
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
## 2019-10-12 ENCOUNTER — Ambulatory Visit (HOSPITAL_COMMUNITY): Payer: Medicare Other

## 2019-10-12 ENCOUNTER — Other Ambulatory Visit: Payer: Self-pay

## 2019-10-12 ENCOUNTER — Encounter (HOSPITAL_COMMUNITY): Payer: Self-pay

## 2019-10-12 DIAGNOSIS — I890 Lymphedema, not elsewhere classified: Secondary | ICD-10-CM
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
## 2019-10-13 ENCOUNTER — Ambulatory Visit (HOSPITAL_COMMUNITY): Payer: Medicare Other | Admitting: Physical Therapy

## 2019-10-13 ENCOUNTER — Encounter (HOSPITAL_COMMUNITY): Payer: Self-pay | Admitting: Physical Therapy

## 2019-10-13 DIAGNOSIS — I890 Lymphedema, not elsewhere classified: Secondary | ICD-10-CM | POA: Diagnosis not present
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
## 2019-10-15 ENCOUNTER — Encounter (HOSPITAL_COMMUNITY): Payer: Self-pay

## 2019-10-15 ENCOUNTER — Other Ambulatory Visit: Payer: Self-pay

## 2019-10-15 ENCOUNTER — Ambulatory Visit (HOSPITAL_COMMUNITY): Payer: Medicare Other

## 2019-10-15 DIAGNOSIS — I890 Lymphedema, not elsewhere classified: Secondary | ICD-10-CM
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
## 2019-10-18 ENCOUNTER — Other Ambulatory Visit: Payer: Self-pay

## 2019-10-18 ENCOUNTER — Ambulatory Visit (HOSPITAL_COMMUNITY): Payer: Medicare Other | Admitting: Physical Therapy

## 2019-10-18 DIAGNOSIS — I890 Lymphedema, not elsewhere classified: Secondary | ICD-10-CM

## 2019-10-18 DIAGNOSIS — J1282 Pneumonia due to coronavirus disease 2019: Secondary | ICD-10-CM | POA: Diagnosis not present

## 2019-10-18 DIAGNOSIS — U071 COVID-19: Secondary | ICD-10-CM | POA: Diagnosis not present
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
## 2019-10-20 ENCOUNTER — Ambulatory Visit (HOSPITAL_COMMUNITY): Payer: Medicare Other

## 2019-10-20 ENCOUNTER — Encounter (HOSPITAL_COMMUNITY): Payer: Self-pay

## 2019-10-20 ENCOUNTER — Other Ambulatory Visit: Payer: Self-pay

## 2019-10-20 DIAGNOSIS — I890 Lymphedema, not elsewhere classified: Secondary | ICD-10-CM | POA: Diagnosis not present
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
## 2019-10-21 ENCOUNTER — Encounter (HOSPITAL_COMMUNITY): Payer: Medicare Other

## 2019-10-22 ENCOUNTER — Encounter (HOSPITAL_COMMUNITY): Payer: Medicare Other | Admitting: Physical Therapy

## 2019-10-22 ENCOUNTER — Telehealth (HOSPITAL_COMMUNITY): Payer: Self-pay | Admitting: Physical Therapy

## 2019-10-22 NOTE — Telephone Encounter (Signed)
pt cancelled appt for today because she is not feeling well 

## 2019-10-25 ENCOUNTER — Ambulatory Visit (HOSPITAL_COMMUNITY): Payer: Medicare Other | Admitting: Physical Therapy

## 2019-10-25 ENCOUNTER — Other Ambulatory Visit: Payer: Self-pay

## 2019-10-25 DIAGNOSIS — I890 Lymphedema, not elsewhere classified: Secondary | ICD-10-CM
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
## 2019-10-27 ENCOUNTER — Other Ambulatory Visit: Payer: Self-pay

## 2019-10-27 ENCOUNTER — Ambulatory Visit (HOSPITAL_COMMUNITY): Payer: Medicare Other | Admitting: Physical Therapy

## 2019-10-27 DIAGNOSIS — I890 Lymphedema, not elsewhere classified: Secondary | ICD-10-CM | POA: Diagnosis not present
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
## 2019-10-28 ENCOUNTER — Encounter (HOSPITAL_COMMUNITY): Payer: Medicare Other | Admitting: Physical Therapy

## 2019-10-29 ENCOUNTER — Other Ambulatory Visit: Payer: Self-pay

## 2019-10-29 ENCOUNTER — Ambulatory Visit (HOSPITAL_COMMUNITY): Payer: Medicare Other | Admitting: Physical Therapy

## 2019-10-29 DIAGNOSIS — Z1231 Encounter for screening mammogram for malignant neoplasm of breast: Secondary | ICD-10-CM | POA: Diagnosis not present

## 2019-10-29 DIAGNOSIS — I890 Lymphedema, not elsewhere classified: Secondary | ICD-10-CM | POA: Diagnosis not present
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
## 2019-11-01 ENCOUNTER — Other Ambulatory Visit: Payer: Self-pay

## 2019-11-01 ENCOUNTER — Telehealth (HOSPITAL_COMMUNITY): Payer: Self-pay | Admitting: Physical Therapy

## 2019-11-01 ENCOUNTER — Ambulatory Visit (HOSPITAL_COMMUNITY): Payer: Medicare Other | Admitting: Physical Therapy

## 2019-11-01 DIAGNOSIS — I890 Lymphedema, not elsewhere classified: Secondary | ICD-10-CM | POA: Diagnosis not present
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
## 2019-11-01 NOTE — Telephone Encounter (Signed)
She has another MD apptment and will not be here

## 2019-11-03 ENCOUNTER — Encounter (HOSPITAL_COMMUNITY): Payer: Medicare Other

## 2019-11-05 ENCOUNTER — Other Ambulatory Visit: Payer: Self-pay

## 2019-11-05 ENCOUNTER — Ambulatory Visit (HOSPITAL_COMMUNITY): Payer: Medicare Other | Admitting: Physical Therapy

## 2019-11-05 DIAGNOSIS — I890 Lymphedema, not elsewhere classified: Secondary | ICD-10-CM | POA: Diagnosis not present

## 2019-11-05 NOTE — Therapy (Signed)
[facility] [address] [place], [place], [address] Phone: [phone]   Fax:  [phone]  Physical Therapy Treatment  Patient Details  Name: Selena Hunt MRN: [id number] Date of Birth: 04-05-1951 Referring Provider (PT): Carlyle Dolly   Encounter Date: 11/05/2019   PT End of Session - 11/05/19 1220    Visit Number 12    Number of Visits 18    Date for PT Re-Evaluation 11/23/19    Authorization Type UHC Medicare    Authorization - Visit Number 12    Authorization - Number of Visits 30    Progress Note Due on Visit 18    PT Start Time 1000    PT Stop Time 1125    PT Time Calculation (min) 85 min    Activity Tolerance Patient tolerated treatment well    Behavior During Therapy [facility] for tasks assessed/performed           Past Medical History:  Diagnosis Date   Benign neoplasm of colon    Blood transfusion without reported diagnosis    x 34 to date   Chronic kidney disease    Colon polyps    Diverticulitis of colon (without mention of hemorrhage)(562.11)    Diverticulosis of colon (without mention of hemorrhage)    Essential hypertension, benign    Female stress incontinence    GERD (gastroesophageal reflux disease)    Hematuria    Hernia of unspecified site of abdominal cavity without mention of obstruction or gangrene    History of closed head injury 04/21/2018   Kidney stones    has had one    Lumbosacral spondylosis without myelopathy    Meniere disease    Drs are uncertain of this DX    Migraine headache    Neuromuscular disorder ([place])    Obesity    Other and unspecified hyperlipidemia    Pneumonia    Pulmonary embolism ([facility])    Seizures ([place])    HX of --- from medications,2003   Stricture and stenosis of esophagus    Thyroid disease    Ventral hernia     Past Surgical History:  Procedure Laterality Date   ABDOMINAL HYSTERECTOMY  1990   APPENDECTOMY     COLON SURGERY  1997     Removed   FEMORAL VARUS OSTEOTOMY W/ ADDUCTOR RELEASE AND ILIAC CREST BONE GRAFT, PELVIC OSTEOTOMY     FEMUR FRACTURE SURGERY Left 2003   HERNIA REPAIR     with mesh, abdominal   HIP FRACTURE SURGERY Left    HUMERUS FRACTURE SURGERY Left    LUMBAR FUSION  1995   Fusion L4 - L5   PARTIAL COLECTOMY     Secondary to diverticulitis   TENDON TRANSFER     TUBAL LIGATION  1972   ULNAR NERVE REPAIR Left     There were no vitals filed for this visit.   Subjective Assessment - 11/05/19 1219    Subjective Pt recieved and has used her pump.  She has no questions.    Pertinent History LT femoral fx, chronic respiratory failure secondary to COVID,                 LYMPHEDEMA/ONCOLOGY QUESTIONNAIRE - 11/05/19 0001      Right Lower Extremity Lymphedema   20 cm Proximal to Suprapatella 57 cm    10 cm Proximal to Suprapatella 48 cm    At Midpatella/Popliteal Crease 44 cm    30 cm Proximal to Floor at  Lateral Plantar Foot 42.4 cm    20 cm Proximal to Floor at Lateral Plantar Foot 36 1    10 cm Proximal to Floor at Lateral Malleoli 28 cm    Circumference of ankle/heel 31.8 cm.    5 cm Proximal to 1st MTP Joint 23.2 cm    Across MTP Joint 21.5 cm      Left Lower Extremity Lymphedema   20 cm Proximal to Suprapatella 60.8 cm    10 cm Proximal to Suprapatella 55.2 cm    At Midpatella/Popliteal Crease 49 cm    30 cm Proximal to Floor at Lateral Plantar Foot 44.9 cm    20 cm Proximal to Floor at Lateral Plantar Foot 42 cm    10 cm Proximal to Floor at Lateral Malleoli 29 cm    Circumference of ankle/heel 31.4 cm.    5 cm Proximal to 1st MTP Joint 24 cm    Across MTP Joint 23.5 cm    Around Proximal Great Toe 8.3 cm                      [facility] Adult PT Treatment/Exercise - 11/05/19 0001      Manual Therapy   Manual Therapy Manual Lymphatic Drainage (MLD);Compression Bandaging;Other (comment)    Manual therapy comments Manual complete separate than rest of tx     Manual Lymphatic Drainage (MLD) Manual lymphatic decognitive complete wiht short neck, superifical and deep abdominal,inguinal/axillary anastomosis and LE  anterior and posteriorly     Compression Bandaging BLE multilayer short stretch bandages with 1/2 foam     Other Manual Therapy measurerment                    PT Short Term Goals - 10/29/19 1659      PT SHORT TERM GOAL #1   Title PT to be I in self manual techniqes to increase lymph circulatin    Time 3    Period Weeks    Status Achieved    Target Date 11/10/19   pt will not begin until mid July     PT [place] #2   Title PT to have lost 3 cm of volume from Lt LE,  2 from RT    Time 3    Period Weeks    Status Partially Met             PT Long Term Goals - 10/29/19 1700      PT LONG TERM GOAL #1   Title PT to have lost 4-5 cm of volume from LT LE< 2-3 from RT LE to allow pt to be able to don her shoes and socks.    Time 6    Period Weeks    Status On-going      PT LONG TERM GOAL #2   Title PT to have obtained and be using her compression pump to assist in maintaining lower volumes of fluid in her LE    Time 6    Period Weeks    Status On-going      PT LONG TERM GOAL #3   Title PT to have and be able to don and doff compressoin garment whether that be juxtafit or hosiery to maintain volume    Time 6    Period Weeks    Status On-going                 Plan - 11/05/19 1220    Clinical  Impression Statement Pt remeasured with minimal change.  Pt has appointment to be fitted for juxtafit on Tuesday.  We will continue one more week to see if volumes can reduce any further, if they do we will continue, if not we will discharge.    Personal Factors and Comorbidities Past/Current Experience    Examination-Activity Limitations Dressing;Locomotion Level;Stand    Examination-Participation Restrictions Driving;Yard Work    Stability/Clinical Decision Making Evolving/Moderate complexity    Clinical  Decision Making Moderate    Rehab Potential Good    PT Frequency 3x / week    PT Duration 6 weeks    PT Treatment/Interventions Manual lymph drainage;Compression bandaging;Patient/family education    PT Next Visit Plan continue with Total decongestive techniques until volumes stabilize.           Patient will benefit from skilled therapeutic intervention in order to improve the following deficits and impairments:  Decreased activity tolerance, Pain, Increased edema  Visit Diagnosis: Lymphedema, not elsewhere classified     Problem List Patient Active Problem List   Diagnosis Date Noted   Pelvic floor dysfunction 09/08/2019   DOE (dyspnea on exertion) 07/14/2019   History of 2019 novel coronavirus disease (COVID-19) 07/14/2019   Elevated ferritin 05/03/2019   Pneumonia due to COVID-19 virus 03/31/2019   Acute respiratory failure with hypoxia ([place]) 03/31/2019   Hypokalemia 03/31/2019   AKI (acute kidney injury) ([place]) 03/31/2019   Vertigo 12/12/2018   Hypothyroidism due to acquired atrophy of thyroid 12/11/2018   Chronic pain due to trauma 12/11/2018   Controlled substance agreement signed 12/11/2018   History of closed head injury 04/21/2018   Nausea without vomiting 01/21/2017   Pain management 01/21/2017   Abdominal pain, epigastric 01/21/2017   Chronic GERD 01/21/2017   AP (abdominal pain) 03/22/2015   Ventral hernia without obstruction or gangrene 03/22/2015   Vitamin D deficiency 09/13/2013   Chest pain 14/01/3012   Metabolic syndrome 14/38/8875   Migraine headache, history of    Lumbosacral spondylosis without myelopathy    Diverticulitis of colon (without mention of hemorrhage)(562.11)    Morbid obesity (H[facility])    Fatigue, chronic    Female stress incontinence    Hypertension    Hematuria    Hyperlipidemia    Benign neoplasm of colon    Stricture and stenosis of esophagus    Rayetta Humphrey, PT CLT [phone] 11/05/2019,  12:23 PM  [facility] [place], [place], [address] Phone: [phone]   Fax:  [phone]  Name: Selena Hunt MRN: [id number] Date of Birth: 11-Aug-1950

## 2019-11-08 ENCOUNTER — Other Ambulatory Visit: Payer: Self-pay

## 2019-11-08 ENCOUNTER — Ambulatory Visit (HOSPITAL_COMMUNITY): Payer: Medicare Other | Attending: Cardiology | Admitting: Physical Therapy

## 2019-11-08 DIAGNOSIS — I890 Lymphedema, not elsewhere classified: Secondary | ICD-10-CM | POA: Diagnosis not present

## 2019-11-08 NOTE — Therapy (Signed)
[place], [place], [address] Phone: [phone]   Fax:  [phone]  Physical Therapy Treatment  Patient Details  Name: Selena Hunt MRN: [id number] Date of Birth: Apr 21, 1950 Referring Provider (PT): Carlyle Dolly   Encounter Date: 11/08/2019   PT End of Session - 11/08/19 1632    Visit Number 13    Number of Visits 18    Date for PT Re-Evaluation 11/23/19    Authorization Type UHC Medicare    Authorization - Visit Number 13    Authorization - Number of Visits 30    Progress Note Due on Visit 18    PT Start Time 1452    PT Stop Time 1604    PT Time Calculation (min) 72 min    Activity Tolerance Patient tolerated treatment well    Behavior During Therapy [facility] for tasks assessed/performed           Past Medical History:  Diagnosis Date  . Benign neoplasm of colon   . Blood transfusion without reported diagnosis    x 34 to date  . Chronic kidney disease   . Colon polyps   . Diverticulitis of colon (without mention of hemorrhage)(562.11)   . Diverticulosis of colon (without mention of hemorrhage)   . Essential hypertension, benign   . Female stress incontinence   . GERD (gastroesophageal reflux disease)   . Hematuria   . Hernia of unspecified site of abdominal cavity without mention of obstruction or gangrene   . History of closed head injury 04/21/2018  . Kidney stones    has had one   . Lumbosacral spondylosis without myelopathy   . Meniere disease    Drs are uncertain of this DX   . Migraine headache   . Neuromuscular disorder ([place])   . Obesity   . Other and unspecified hyperlipidemia   . Pneumonia   . Pulmonary embolism ([place])   . Seizures ([place])    HX of --- from medications,2003  . Stricture and stenosis of esophagus   . Thyroid disease   . Ventral hernia     Past Surgical History:  Procedure Laterality Date  . ABDOMINAL HYSTERECTOMY  1990  . APPENDECTOMY    . [place]    Removed  . FEMORAL VARUS OSTEOTOMY W/ ADDUCTOR RELEASE AND ILIAC CREST BONE GRAFT, PELVIC OSTEOTOMY    . FEMUR FRACTURE SURGERY Left 2003  . HERNIA REPAIR     with mesh, abdominal  . HIP FRACTURE SURGERY Left   . HUMERUS FRACTURE SURGERY Left   . LUMBAR FUSION  1995   Fusion L4 - L5  . PARTIAL COLECTOMY     Secondary to diverticulitis  . TENDON TRANSFER    . TUBAL LIGATION  1972  . ULNAR NERVE REPAIR Left     There were no vitals filed for this visit.   Subjective Assessment - 11/08/19 1631    Subjective pt states she goes to [facility] tomorrow to get her juxtas.  States she continues to use her pump.    Currently in Pain? No/denies                             [facility] Adult PT Treatment/Exercise - 11/08/19 0001      Manual Therapy   Manual Therapy Manual Lymphatic Drainage (MLD);Compression Bandaging;Other (comment)    Manual therapy comments Manual complete separate than rest of tx  Manual Lymphatic Drainage (MLD) Manual lymphatic decognitive complete wiht short neck, superifical and deep abdominal,inguinal/axillary anastomosis and LE  anterior and posteriorly     Compression Bandaging BLE multilayer short stretch bandages with 1/2 foam  full LE                    PT Short Term Goals - 10/29/19 1659      PT SHORT TERM GOAL #1   Title PT to be I in self manual techniqes to increase lymph circulatin    Time 3    Period Weeks    Status Achieved    Target Date 11/10/19   pt will not begin until mid July     PT [place] #2   Title PT to have lost 3 cm of volume from Lt LE,  2 from RT    Time 3    Period Weeks    Status Partially Met             PT Long Term Goals - 10/29/19 1700      PT LONG TERM GOAL #1   Title PT to have lost 4-5 cm of volume from LT LE< 2-3 from RT LE to allow pt to be able to don her shoes and socks.    Time 6    Period Weeks    Status On-going      PT LONG TERM GOAL #2   Title PT to have obtained and be  using her compression pump to assist in maintaining lower volumes of fluid in her LE    Time 6    Period Weeks    Status On-going      PT LONG TERM GOAL #3   Title PT to have and be able to don and doff compressoin garment whether that be juxtafit or hosiery to maintain volume    Time 6    Period Weeks    Status On-going                 Plan - 11/08/19 1633    Clinical Impression Statement continued with complete decongestive techniques for bilateral LE's.  NOted reduction in induration this session, especially in distal LE.  Instructed to keep bandaging on until an hour before her fitting appt tomorrow.  Pt verbalized understanding.    Personal Factors and Comorbidities Past/Current Experience    Examination-Activity Limitations Dressing;Locomotion Level;Stand    Examination-Participation Restrictions Driving;Yard Work    Stability/Clinical Decision Making Evolving/Moderate complexity    Rehab Potential Good    PT Frequency 3x / week    PT Duration 6 weeks    PT Treatment/Interventions Manual lymph drainage;Compression bandaging;Patient/family education    PT Next Visit Plan continue with Total decongestive techniques until volumes stabilize.           Patient will benefit from skilled therapeutic intervention in order to improve the following deficits and impairments:  Decreased activity tolerance, Pain, Increased edema  Visit Diagnosis: Lymphedema, not elsewhere classified     Problem List Patient Active Problem List   Diagnosis Date Noted  . Pelvic floor dysfunction 09/08/2019  . DOE (dyspnea on exertion) 07/14/2019  . History of 2019 novel coronavirus disease (COVID-19) 07/14/2019  . Elevated ferritin 05/03/2019  . Pneumonia due to COVID-19 virus 03/31/2019  . Acute respiratory failure with hypoxia ([place]) 03/31/2019  . Hypokalemia 03/31/2019  . AKI (acute kidney injury) ([place]) 03/31/2019  . Vertigo 12/12/2018  . Hypothyroidism due to acquired atrophy of  thyroid 12/11/2018  . Chronic pain due to trauma 12/11/2018  . Controlled substance agreement signed 12/11/2018  . History of closed head injury 04/21/2018  . Nausea without vomiting 01/21/2017  . Pain management 01/21/2017  . Abdominal pain, epigastric 01/21/2017  . Chronic GERD 01/21/2017  . AP (abdominal pain) 03/22/2015  . Ventral hernia without obstruction or gangrene 03/22/2015  . Vitamin D deficiency 09/13/2013  . Chest pain 09/04/2013  . Metabolic syndrome 40/76/8088  . Migraine headache, history of   . Lumbosacral spondylosis without myelopathy   . Diverticulitis of colon (without mention of hemorrhage)(562.11)   . Morbid obesity ([place])   . Fatigue, chronic   . Female stress incontinence   . Hypertension   . Hematuria   . Hyperlipidemia   . Benign neoplasm of colon   . Stricture and stenosis of esophagus    Teena Irani, PTA/CLT [phone]  Teena Irani 11/08/2019, 4:35 PM  [facility] [address] [place], [place], [address] Phone: [phone]   Fax:  [phone]  Name: Selena Hunt MRN: [id number] Date of Birth: [birth year]

## 2019-11-10 ENCOUNTER — Encounter (HOSPITAL_COMMUNITY): Payer: Self-pay

## 2019-11-10 ENCOUNTER — Other Ambulatory Visit: Payer: Self-pay

## 2019-11-10 ENCOUNTER — Other Ambulatory Visit: Payer: Self-pay | Admitting: Family Medicine

## 2019-11-10 ENCOUNTER — Ambulatory Visit (HOSPITAL_COMMUNITY): Payer: Medicare Other

## 2019-11-10 DIAGNOSIS — I890 Lymphedema, not elsewhere classified: Secondary | ICD-10-CM

## 2019-11-10 NOTE — Therapy (Signed)
[place], [place], [address] Phone: [phone]   Fax:  [phone]  Physical Therapy Treatment  Patient Details  Name: Selena Hunt MRN: [id number] Date of Birth: [birth year] Referring Provider (PT): Carlyle Dolly   Encounter Date: 11/10/2019   PT End of Session - 11/10/19 1908    Visit Number 14    Number of Visits 18    Date for PT Re-Evaluation 11/23/19    Authorization Type UHC Medicare    Authorization - Visit Number 14    Authorization - Number of Visits 30    Progress Note Due on Visit 18    PT Start Time 1708    PT Stop Time 1827    PT Time Calculation (min) 79 min    Activity Tolerance Patient tolerated treatment well    Behavior During Therapy [facility] for tasks assessed/performed           Past Medical History:  Diagnosis Date  . Benign neoplasm of colon   . Blood transfusion without reported diagnosis    x 34 to date  . Chronic kidney disease   . Colon polyps   . Diverticulitis of colon (without mention of hemorrhage)(562.11)   . Diverticulosis of colon (without mention of hemorrhage)   . Essential hypertension, benign   . Female stress incontinence   . GERD (gastroesophageal reflux disease)   . Hematuria   . Hernia of unspecified site of abdominal cavity without mention of obstruction or gangrene   . History of closed head injury 04/21/2018  . Kidney stones    has had one   . Lumbosacral spondylosis without myelopathy   . Meniere disease    Drs are uncertain of this DX   . Migraine headache   . Neuromuscular disorder ([place])   . Obesity   . Other and unspecified hyperlipidemia   . Pneumonia   . Pulmonary embolism ([place])   . Seizures ([place])    HX of --- from medications,2003  . Stricture and stenosis of esophagus   . Thyroid disease   . Ventral hernia     Past Surgical History:  Procedure Laterality Date  . ABDOMINAL HYSTERECTOMY  1990  . APPENDECTOMY    . [place]    Removed  . FEMORAL VARUS OSTEOTOMY W/ ADDUCTOR RELEASE AND ILIAC CREST BONE GRAFT, PELVIC OSTEOTOMY    . FEMUR FRACTURE SURGERY Left 2003  . HERNIA REPAIR     with mesh, abdominal  . HIP FRACTURE SURGERY Left   . HUMERUS FRACTURE SURGERY Left   . LUMBAR FUSION  1995   Fusion L4 - L5  . PARTIAL COLECTOMY     Secondary to diverticulitis  . TENDON TRANSFER    . TUBAL LIGATION  1972  . ULNAR NERVE REPAIR Left     There were no vitals filed for this visit.   Subjective Assessment - 11/10/19 1907    Subjective Pt stated she had to get specially measured as no juxtas in stock, waiting on call from [facility].  Stated she is so happy to see her ankles.    Pertinent History LT femoral fx, chronic respiratory failure secondary to COVID,    Currently in Pain? No/denies                             [facility] Adult PT Treatment/Exercise - 11/10/19 0001      Manual Therapy  Manual Therapy Manual Lymphatic Drainage (MLD);Compression Bandaging;Other (comment)    Manual therapy comments Manual complete separate than rest of tx    Manual Lymphatic Drainage (MLD) Manual lymphatic decognitive complete wiht short neck, superifical and deep abdominal,inguinal/axillary anastomosis and LE  anterior and posteriorly     Compression Bandaging BLE multilayer short stretch bandages with 1/2 foam  full LE                    PT Short Term Goals - 10/29/19 1659      PT SHORT TERM GOAL #1   Title PT to be I in self manual techniqes to increase lymph circulatin    Time 3    Period Weeks    Status Achieved    Target Date 11/10/19   pt will not begin until mid July     PT [place] #2   Title PT to have lost 3 cm of volume from Lt LE,  2 from RT    Time 3    Period Weeks    Status Partially Met             PT Long Term Goals - 10/29/19 1700      PT LONG TERM GOAL #1   Title PT to have lost 4-5 cm of volume from LT LE< 2-3 from RT LE to allow pt to be able to don her  shoes and socks.    Time 6    Period Weeks    Status On-going      PT LONG TERM GOAL #2   Title PT to have obtained and be using her compression pump to assist in maintaining lower volumes of fluid in her LE    Time 6    Period Weeks    Status On-going      PT LONG TERM GOAL #3   Title PT to have and be able to don and doff compressoin garment whether that be juxtafit or hosiery to maintain volume    Time 6    Period Weeks    Status On-going                 Plan - 11/10/19 1909    Clinical Impression Statement Session included manual decongestive techniuqes for Bil LE's followed by multilayer short stretch bandages included wiht foam.  Reduction in induration especially distal LE.    Personal Factors and Comorbidities Past/Current Experience    Examination-Activity Limitations Dressing;Locomotion Level;Stand    Examination-Participation Restrictions Driving;Yard Work    Stability/Clinical Decision Making Evolving/Moderate complexity    Clinical Decision Making Moderate    Rehab Potential Good    PT Frequency 3x / week    PT Duration 6 weeks    PT Treatment/Interventions Manual lymph drainage;Compression bandaging;Patient/family education    PT Next Visit Plan continue with Total decongestive techniques until volumes stabilize.  Measure next session.    PT Home Exercise Plan LE exerciseses           Patient will benefit from skilled therapeutic intervention in order to improve the following deficits and impairments:  Decreased activity tolerance, Pain, Increased edema  Visit Diagnosis: Lymphedema, not elsewhere classified     Problem List Patient Active Problem List   Diagnosis Date Noted  . Pelvic floor dysfunction 09/08/2019  . DOE (dyspnea on exertion) 07/14/2019  . History of 2019 novel coronavirus disease (COVID-19) 07/14/2019  . Elevated ferritin 05/03/2019  . Pneumonia due to COVID-19 virus 03/31/2019  .  Acute respiratory failure with hypoxia ([place])  03/31/2019  . Hypokalemia 03/31/2019  . AKI (acute kidney injury) ([place]) 03/31/2019  . Vertigo 12/12/2018  . Hypothyroidism due to acquired atrophy of thyroid 12/11/2018  . Chronic pain due to trauma 12/11/2018  . Controlled substance agreement signed 12/11/2018  . History of closed head injury 04/21/2018  . Nausea without vomiting 01/21/2017  . Pain management 01/21/2017  . Abdominal pain, epigastric 01/21/2017  . Chronic GERD 01/21/2017  . AP (abdominal pain) 03/22/2015  . Ventral hernia without obstruction or gangrene 03/22/2015  . Vitamin D deficiency 09/13/2013  . Chest pain 09/04/2013  . Metabolic syndrome 38/46/6599  . Migraine headache, history of   . Lumbosacral spondylosis without myelopathy   . Diverticulitis of colon (without mention of hemorrhage)(562.11)   . Morbid obesity ([place])   . Fatigue, chronic   . Female stress incontinence   . Hypertension   . Hematuria   . Hyperlipidemia   . Benign neoplasm of colon   . Stricture and stenosis of esophagus    Ihor Austin, LPTA/CLT; CBIS [phone]  Aldona Lento 11/10/2019, 7:14 PM  [facility] [address] [place], [place], [address] Phone: [phone]   Fax:  [phone]  Name: TABBATHA BORDELON MRN: [id number] Date of Birth: Jul 22, 1950

## 2019-11-12 ENCOUNTER — Ambulatory Visit (HOSPITAL_COMMUNITY): Payer: Medicare Other

## 2019-11-12 ENCOUNTER — Encounter (HOSPITAL_COMMUNITY): Payer: Self-pay

## 2019-11-12 ENCOUNTER — Other Ambulatory Visit: Payer: Self-pay

## 2019-11-12 DIAGNOSIS — I890 Lymphedema, not elsewhere classified: Secondary | ICD-10-CM

## 2019-11-12 NOTE — Therapy (Signed)
[place], [place], [address] Phone: [phone]   Fax:  [phone]  Physical Therapy Treatment  Patient Details  Name: Selena Hunt MRN: [id number] Date of Birth: April 14, 1950 Referring Provider (PT): Carlyle Dolly   Encounter Date: 11/12/2019   PT End of Session - 11/12/19 0950    Visit Number 15    Number of Visits 18    Date for PT Re-Evaluation 11/23/19    Authorization Type UHC Medicare    Authorization - Visit Number 15    Authorization - Number of Visits 30    Progress Note Due on Visit 18    PT Start Time 0827    PT Stop Time 0949    PT Time Calculation (min) 82 min    Activity Tolerance Patient tolerated treatment well    Behavior During Therapy [facility] for tasks assessed/performed           Past Medical History:  Diagnosis Date  . Benign neoplasm of colon   . Blood transfusion without reported diagnosis    x 34 to date  . Chronic kidney disease   . Colon polyps   . Diverticulitis of colon (without mention of hemorrhage)(562.11)   . Diverticulosis of colon (without mention of hemorrhage)   . Essential hypertension, benign   . Female stress incontinence   . GERD (gastroesophageal reflux disease)   . Hematuria   . Hernia of unspecified site of abdominal cavity without mention of obstruction or gangrene   . History of closed head injury 04/21/2018  . Kidney stones    has had one   . Lumbosacral spondylosis without myelopathy   . Meniere disease    Drs are uncertain of this DX   . Migraine headache   . Neuromuscular disorder ([place])   . Obesity   . Other and unspecified hyperlipidemia   . Pneumonia   . Pulmonary embolism ([place])   . Seizures ([place])    HX of --- from medications,2003  . Stricture and stenosis of esophagus   . Thyroid disease   . Ventral hernia     Past Surgical History:  Procedure Laterality Date  . ABDOMINAL HYSTERECTOMY  1990  . APPENDECTOMY    . [place]    Removed  . FEMORAL VARUS OSTEOTOMY W/ ADDUCTOR RELEASE AND ILIAC CREST BONE GRAFT, PELVIC OSTEOTOMY    . FEMUR FRACTURE SURGERY Left 2003  . HERNIA REPAIR     with mesh, abdominal  . HIP FRACTURE SURGERY Left   . HUMERUS FRACTURE SURGERY Left   . LUMBAR FUSION  1995   Fusion L4 - L5  . PARTIAL COLECTOMY     Secondary to diverticulitis  . TENDON TRANSFER    . TUBAL LIGATION  1972  . ULNAR NERVE REPAIR Left     There were no vitals filed for this visit.   Subjective Assessment - 11/12/19 0949    Subjective Pt stated she can tell her legs are getting smaller.    Pertinent History LT femoral fx, chronic respiratory failure secondary to COVID,    Currently in Pain? No/denies                 LYMPHEDEMA/ONCOLOGY QUESTIONNAIRE - 11/12/19 0001      Right Lower Extremity Lymphedema   20 cm Proximal to Suprapatella 57.5 cm   was    10 cm Proximal to Suprapatella 48.5 cm   was 49.8   At Midpatella/Popliteal Crease 44  cm   was 45.3   30 cm Proximal to Floor at Lateral Plantar Foot 39.6 cm   was 41.7   20 cm Proximal to Floor at Lateral Plantar Foot 28 1   was 36.8   10 cm Proximal to Floor at Lateral Malleoli 31 cm   was 27.8   Circumference of ankle/heel 24 cm.   was 31.2   5 cm Proximal to 1st MTP Joint 24 cm   was 23.3   Across MTP Joint 21 cm   was 22.2   Around Proximal Great Toe 8 cm      Left Lower Extremity Lymphedema   20 cm Proximal to Suprapatella 58.8 cm   was 63.9   10 cm Proximal to Suprapatella 53.4 cm   was 5.2   At Midpatella/Popliteal Crease 49 cm   was 51.3   30 cm Proximal to Floor at Lateral Plantar Foot 44 cm   was 46   20 cm Proximal to Floor at Lateral Plantar Foot 39.6 cm   was 41.2   10 cm Proximal to Floor at Lateral Malleoli 30 cm   was 28.7   Circumference of ankle/heel 30.5 cm.   was 28.7   5 cm Proximal to 1st MTP Joint 24 cm   was 32.7   Across MTP Joint 21.5 cm   was 23.6   Around Proximal Great Toe 7.8 cm                                 PT Short Term Goals - 10/29/19 1659      PT SHORT TERM GOAL #1   Title PT to be I in self manual techniqes to increase lymph circulatin    Time 3    Period Weeks    Status Achieved    Target Date 11/10/19   pt will not begin until mid July     PT SHORT TERM GOAL #2   Title PT to have lost 3 cm of volume from Lt LE,  2 from RT    Time 3    Period Weeks    Status Partially Met             PT Long Term Goals - 10/29/19 1700      PT LONG TERM GOAL #1   Title PT to have lost 4-5 cm of volume from LT LE< 2-3 from RT LE to allow pt to be able to don her shoes and socks.    Time 6    Period Weeks    Status On-going      PT LONG TERM GOAL #2   Title PT to have obtained and be using her compression pump to assist in maintaining lower volumes of fluid in her LE    Time 6    Period Weeks    Status On-going      PT LONG TERM GOAL #3   Title PT to have and be able to don and doff compressoin garment whether that be juxtafit or hosiery to maintain volume    Time 6    Period Weeks    Status On-going                 Plan - 11/12/19 1304    Clinical Impression Statement Measurements taken with reduction in overall Bil LE following by multilayer short stretch bandage included with foam.  Induration reduced especially distal LE.  Personal Factors and Comorbidities Past/Current Experience    Examination-Activity Limitations Dressing;Locomotion Level;Stand    Examination-Participation Restrictions Driving;Yard Work    Stability/Clinical Decision Making Evolving/Moderate complexity    Clinical Decision Making Moderate    Rehab Potential Good    PT Frequency 3x / week    PT Duration 6 weeks    PT Treatment/Interventions Manual lymph drainage;Compression bandaging;Patient/family education    PT Next Visit Plan continue with Total decongestive techniques until volumes stabilize.  Measure on Friday.    PT Home Exercise Plan LE  exerciseses           Patient will benefit from skilled therapeutic intervention in order to improve the following deficits and impairments:  Decreased activity tolerance, Pain, Increased edema  Visit Diagnosis: Lymphedema, not elsewhere classified     Problem List Patient Active Problem List   Diagnosis Date Noted  . Pelvic floor dysfunction 09/08/2019  . DOE (dyspnea on exertion) 07/14/2019  . History of 2019 novel coronavirus disease (COVID-19) 07/14/2019  . Elevated ferritin 05/03/2019  . Pneumonia due to COVID-19 virus 03/31/2019  . Acute respiratory failure with hypoxia ([place]) 03/31/2019  . Hypokalemia 03/31/2019  . AKI (acute kidney injury) ([place]) 03/31/2019  . Vertigo 12/12/2018  . Hypothyroidism due to acquired atrophy of thyroid 12/11/2018  . Chronic pain due to trauma 12/11/2018  . Controlled substance agreement signed 12/11/2018  . History of closed head injury 04/21/2018  . Nausea without vomiting 01/21/2017  . Pain management 01/21/2017  . Abdominal pain, epigastric 01/21/2017  . Chronic GERD 01/21/2017  . AP (abdominal pain) 03/22/2015  . Ventral hernia without obstruction or gangrene 03/22/2015  . Vitamin D deficiency 09/13/2013  . Chest pain 09/04/2013  . Metabolic syndrome 38/25/0539  . Migraine headache, history of   . Lumbosacral spondylosis without myelopathy   . Diverticulitis of colon (without mention of hemorrhage)(562.11)   . Morbid obesity ([place])   . Fatigue, chronic   . Female stress incontinence   . Hypertension   . Hematuria   . Hyperlipidemia   . Benign neoplasm of colon   . Stricture and stenosis of esophagus    Ihor Austin, LPTA/CLT; CBIS [phone]  Aldona Lento 11/12/2019, 7:02 PM  [facility] [address] [place], [place], [address] Phone: [phone]   Fax:  [phone]  Name: Selena Hunt MRN: [id number] Date of Birth: 01/06/1951

## 2019-11-15 ENCOUNTER — Ambulatory Visit (HOSPITAL_COMMUNITY): Payer: Medicare Other | Admitting: Physical Therapy

## 2019-11-17 ENCOUNTER — Ambulatory Visit (HOSPITAL_COMMUNITY): Payer: Medicare Other

## 2019-11-17 ENCOUNTER — Encounter (HOSPITAL_COMMUNITY): Payer: Self-pay

## 2019-11-17 ENCOUNTER — Other Ambulatory Visit: Payer: Self-pay

## 2019-11-17 DIAGNOSIS — I890 Lymphedema, not elsewhere classified: Secondary | ICD-10-CM

## 2019-11-17 NOTE — Therapy (Signed)
[place], [place], [address] Phone: [phone]   Fax:  [phone]  Physical Therapy Treatment  Patient Details  Name: Selena Hunt MRN: [id number] Date of Birth: Dec 30, 1950 Referring Provider (PT): Carlyle Dolly   Encounter Date: 11/17/2019   PT End of Session - 11/17/19 1836    Visit Number 16    Number of Visits 18    Date for PT Re-Evaluation 11/23/19    Authorization Type [employer] Medicare    Authorization - Visit Number 16    Authorization - Number of Visits 30    Progress Note Due on Visit 18    PT Start Time 1093    PT Stop Time 1832    PT Time Calculation (min) 87 min    Activity Tolerance Patient tolerated treatment well    Behavior During Therapy [facility] for tasks assessed/performed           Past Medical History:  Diagnosis Date  . Benign neoplasm of colon   . Blood transfusion without reported diagnosis    x 34 to date  . Chronic kidney disease   . Colon polyps   . Diverticulitis of colon (without mention of hemorrhage)(562.11)   . Diverticulosis of colon (without mention of hemorrhage)   . Essential hypertension, benign   . Female stress incontinence   . GERD (gastroesophageal reflux disease)   . Hematuria   . Hernia of unspecified site of abdominal cavity without mention of obstruction or gangrene   . History of closed head injury 04/21/2018  . Kidney stones    has had one   . Lumbosacral spondylosis without myelopathy   . Meniere disease    Drs are uncertain of this DX   . Migraine headache   . Neuromuscular disorder ([place])   . Obesity   . Other and unspecified hyperlipidemia   . Pneumonia   . Pulmonary embolism ([place])   . Seizures ([place])    HX of --- from medications,2003  . Stricture and stenosis of esophagus   . Thyroid disease   . Ventral hernia     Past Surgical History:  Procedure Laterality Date  . ABDOMINAL HYSTERECTOMY  1990  . APPENDECTOMY    . [place]     Removed  . FEMORAL VARUS OSTEOTOMY W/ ADDUCTOR RELEASE AND ILIAC CREST BONE GRAFT, PELVIC OSTEOTOMY    . FEMUR FRACTURE SURGERY Left 2003  . HERNIA REPAIR     with mesh, abdominal  . HIP FRACTURE SURGERY Left   . HUMERUS FRACTURE SURGERY Left   . LUMBAR FUSION  1995   Fusion L4 - L5  . PARTIAL COLECTOMY     Secondary to diverticulitis  . TENDON TRANSFER    . TUBAL LIGATION  1972  . ULNAR NERVE REPAIR Left     There were no vitals filed for this visit.   Subjective Assessment - 11/17/19 1834    Subjective Plans to call [facility] tomorrow to see is compression garment is ready.  Stated she has lost 10lbs in a month with change in diet and exercise.  Continues to use pump regularly and self massage.    Pertinent History LT femoral fx, chronic respiratory failure secondary to COVID,    Currently in Pain? No/denies                             [facility] Adult PT Treatment/Exercise - 11/17/19 0001  Manual Therapy   Manual Therapy Manual Lymphatic Drainage (MLD);Compression Bandaging;Other (comment)    Manual therapy comments Manual complete separate than rest of tx    Manual Lymphatic Drainage (MLD) Manual lymphatic decognitive complete wiht short neck, superifical and deep abdominal,inguinal/axillary anastomosis and LE  anterior and posteriorly     Compression Bandaging BLE multilayer short stretch bandages with 1/2 foam  full LE                    PT Short Term Goals - 10/29/19 1659      PT SHORT TERM GOAL #1   Title PT to be I in self manual techniqes to increase lymph circulatin    Time 3    Period Weeks    Status Achieved    Target Date 11/10/19   pt will not begin until mid July     PT [place] #2   Title PT to have lost 3 cm of volume from Lt LE,  2 from RT    Time 3    Period Weeks    Status Partially Met             PT Long Term Goals - 10/29/19 1700      PT LONG TERM GOAL #1   Title PT to have lost 4-5 cm of volume from  LT LE< 2-3 from RT LE to allow pt to be able to don her shoes and socks.    Time 6    Period Weeks    Status On-going      PT LONG TERM GOAL #2   Title PT to have obtained and be using her compression pump to assist in maintaining lower volumes of fluid in her LE    Time 6    Period Weeks    Status On-going      PT LONG TERM GOAL #3   Title PT to have and be able to don and doff compressoin garment whether that be juxtafit or hosiery to maintain volume    Time 6    Period Weeks    Status On-going                 Plan - 11/17/19 1837    Clinical Impression Statement Session focus iwht manual decongestive techniques and multilayer short stretch bandages including 1/2in foam.  Minimal induration present Bil LE.  Pt will be ready for DC upon receiving compression garments, plans to call tomorrow.    Personal Factors and Comorbidities Past/Current Experience    Examination-Activity Limitations Dressing;Locomotion Level;Stand    Examination-Participation Restrictions Driving;Yard Work    Stability/Clinical Decision Making Evolving/Moderate complexity    Clinical Decision Making Moderate    Rehab Potential Good    PT Frequency 3x / week    PT Duration 6 weeks    PT Treatment/Interventions Manual lymph drainage;Compression bandaging;Patient/family education    PT Next Visit Plan continue with Total decongestive techniques until volumes stabilize.  Measure on Friday.    PT Home Exercise Plan LE exerciseses           Patient will benefit from skilled therapeutic intervention in order to improve the following deficits and impairments:  Decreased activity tolerance, Pain, Increased edema  Visit Diagnosis: Lymphedema, not elsewhere classified     Problem List Patient Active Problem List   Diagnosis Date Noted  . Pelvic floor dysfunction 09/08/2019  . DOE (dyspnea on exertion) 07/14/2019  . History of 2019 novel coronavirus disease (COVID-19) 07/14/2019  .  Elevated  ferritin 05/03/2019  . Pneumonia due to COVID-19 virus 03/31/2019  . Acute respiratory failure with hypoxia ([place]) 03/31/2019  . Hypokalemia 03/31/2019  . AKI (acute kidney injury) ([place]) 03/31/2019  . Vertigo 12/12/2018  . Hypothyroidism due to acquired atrophy of thyroid 12/11/2018  . Chronic pain due to trauma 12/11/2018  . Controlled substance agreement signed 12/11/2018  . History of closed head injury 04/21/2018  . Nausea without vomiting 01/21/2017  . Pain management 01/21/2017  . Abdominal pain, epigastric 01/21/2017  . Chronic GERD 01/21/2017  . AP (abdominal pain) 03/22/2015  . Ventral hernia without obstruction or gangrene 03/22/2015  . Vitamin D deficiency 09/13/2013  . Chest pain 09/04/2013  . Metabolic syndrome 54/30/1484  . Migraine headache, history of   . Lumbosacral spondylosis without myelopathy   . Diverticulitis of colon (without mention of hemorrhage)(562.11)   . Morbid obesity ([place])   . Fatigue, chronic   . Female stress incontinence   . Hypertension   . Hematuria   . Hyperlipidemia   . Benign neoplasm of colon   . Stricture and stenosis of esophagus    Ihor Austin, LPTA/CLT; CBIS [phone]  Aldona Lento 11/17/2019, 6:40 PM  [facility] [address] [place], [place], [address] Phone: [phone]   Fax:  [phone]  Name: Selena Hunt MRN: [id number] Date of Birth: Aug 30, 1950

## 2019-11-18 DIAGNOSIS — U071 COVID-19: Secondary | ICD-10-CM | POA: Diagnosis not present

## 2019-11-18 DIAGNOSIS — J1282 Pneumonia due to coronavirus disease 2019: Secondary | ICD-10-CM | POA: Diagnosis not present

## 2019-11-19 ENCOUNTER — Ambulatory Visit (HOSPITAL_COMMUNITY): Payer: Medicare Other | Admitting: Physical Therapy

## 2019-11-19 ENCOUNTER — Other Ambulatory Visit: Payer: Self-pay

## 2019-11-19 DIAGNOSIS — I890 Lymphedema, not elsewhere classified: Secondary | ICD-10-CM

## 2019-11-19 NOTE — Therapy (Signed)
[facility] [address] [place], [place], [address] Phone: [phone]   Fax:  [phone]  Physical Therapy Treatment  Patient Details  Name: Selena Hunt MRN: [id number] Date of Birth: [birth year] Referring Provider (PT): Carlyle Dolly  PHYSICAL THERAPY DISCHARGE SUMMARY  Visits from Start of Care: 17  Current functional level related to goals / functional outcomes: See below   Remaining deficits: Some edema remains present but volumes have been stable    Education / Equipment: Self care  Plan: Patient agrees to discharge.  Patient goals were partially met. Patient is being discharged due to being pleased with the current functional level.  ?????     Encounter Date: 11/19/2019   PT End of Session - 11/19/19 0856    Visit Number 17    Number of Visits 17    Authorization Type [employer] Medicare    Authorization - Visit Number 17    Authorization - Number of Visits 30    PT Start Time 0830    PT Stop Time 0845    PT Time Calculation (min) 15 min    Behavior During Therapy [facility] for tasks assessed/performed           Past Medical History:  Diagnosis Date  . Benign neoplasm of colon   . Blood transfusion without reported diagnosis    x 34 to date  . Chronic kidney disease   . Colon polyps   . Diverticulitis of colon (without mention of hemorrhage)(562.11)   . Diverticulosis of colon (without mention of hemorrhage)   . Essential hypertension, benign   . Female stress incontinence   . GERD (gastroesophageal reflux disease)   . Hematuria   . Hernia of unspecified site of abdominal cavity without mention of obstruction or gangrene   . History of closed head injury 04/21/2018  . Kidney stones    has had one   . Lumbosacral spondylosis without myelopathy   . Meniere disease    Drs are uncertain of this DX   . Migraine headache   . Neuromuscular disorder ([place])   . Obesity   . Other and unspecified hyperlipidemia   .  Pneumonia   . Pulmonary embolism ([place])   . Seizures ([place])    HX of --- from medications,2003  . Stricture and stenosis of esophagus   . Thyroid disease   . Ventral hernia     Past Surgical History:  Procedure Laterality Date  . ABDOMINAL HYSTERECTOMY  1990  . APPENDECTOMY    . [place]   Removed  . FEMORAL VARUS OSTEOTOMY W/ ADDUCTOR RELEASE AND ILIAC CREST BONE GRAFT, PELVIC OSTEOTOMY    . FEMUR FRACTURE SURGERY Left 2003  . HERNIA REPAIR     with mesh, abdominal  . HIP FRACTURE SURGERY Left   . HUMERUS FRACTURE SURGERY Left   . LUMBAR FUSION  1995   Fusion L4 - L5  . PARTIAL COLECTOMY     Secondary to diverticulitis  . TENDON TRANSFER    . TUBAL LIGATION  1972  . ULNAR NERVE REPAIR Left     There were no vitals filed for this visit.   Subjective Assessment - 11/19/19 0832    Subjective PT states that her Juxtafit have came in.   She is using her pump twice a day.  She has no questions on the exercises.  She is working on self manual but it is difficult.    Currently in Pain? No/denies  LYMPHEDEMA/ONCOLOGY QUESTIONNAIRE - 11/19/19 0001      Right Lower Extremity Lymphedema   20 cm Proximal to Suprapatella 57.5 cm   was 59.5   10 cm Proximal to Suprapatella 48.5 cm   was 49.8   At Midpatella/Popliteal Crease 44 cm   was 45.3   30 cm Proximal to Floor at Lateral Plantar Foot 39.6 cm   was 41.7   20 cm Proximal to Floor at Lateral Plantar Foot 28 1   was 36.8   10 cm Proximal to Floor at Lateral Malleoli 31 cm   was 27.8   Circumference of ankle/heel 24 cm.   was 31.2   5 cm Proximal to 1st MTP Joint 24 cm   was 23.3   Across MTP Joint 21 cm   was 22.2   Around Proximal Great Toe 8 cm      Left Lower Extremity Lymphedema   20 cm Proximal to Suprapatella 58.8 cm   was 63.9   10 cm Proximal to Suprapatella 53.4 cm   was 5.2   At Midpatella/Popliteal Crease 49 cm   was 51.3   30 cm Proximal to Floor at Lateral Plantar Foot 44 cm    was 46   20 cm Proximal to Floor at Lateral Plantar Foot 39.6 cm   was 41.2   10 cm Proximal to Floor at Lateral Malleoli 30 cm   was 28.7   Circumference of ankle/heel 30.5 cm.   was 28.7   5 cm Proximal to 1st MTP Joint 24 cm   was 32.7   Across MTP Joint 21.5 cm   was 23.6   Around Proximal Great Toe 7.8 cm                              PT Education - 11/19/19 0841    Education Details Reviewed all aspects of self care;  signs of cellulitis.  Explained to return if edma. is getting out of control            PT Short Term Goals - 11/19/19 0845      PT SHORT TERM GOAL #1   Title PT to be I in self manual techniqes to increase lymph circulatin    Time 3    Period Weeks    Status Achieved    Target Date 11/10/19      PT SHORT TERM GOAL #2   Title PT to have lost 3 cm of volume from Lt LE,  2 from RT    Time 3    Period Weeks    Status Partially Met             PT Long Term Goals - 11/19/19 0845      PT LONG TERM GOAL #1   Title PT to have lost 4-5 cm of volume from LT LE< 2-3 from RT LE to allow pt to be able to don her shoes and socks.    Time 6    Period Weeks    Status Not Met      PT LONG TERM GOAL #2   Title PT to have obtained and be using her compression pump to assist in maintaining lower volumes of fluid in her LE    Time 6    Period Weeks    Status Achieved      PT LONG TERM GOAL #3   Title PT to have and be able to  don and doff compressoin garment whether that be juxtafit or hosiery to maintain volume    Time 6    Period Weeks    Status Achieved                 Plan - 11/19/19 0858    Clinical Impression Statement PT has been educated and has no questions on all aspects of lymphedema.  She is I in self care and is going to pick her juxtafit up today.  Pt is ready for discharge.    Personal Factors and Comorbidities Past/Current Experience    Examination-Activity Limitations Dressing;Locomotion Level;Stand     Examination-Participation Restrictions Driving;Yard Work    Stability/Clinical Decision Making Evolving/Moderate complexity    Rehab Potential Good    PT Frequency 3x / week    PT Duration 6 weeks    PT Treatment/Interventions Manual lymph drainage;Compression bandaging;Patient/family education    PT Next Visit Plan Discharge.    PT Home Exercise Plan LE exerciseses           Patient will benefit from skilled therapeutic intervention in order to improve the following deficits and impairments:  Decreased activity tolerance, Pain, Increased edema  Visit Diagnosis: Lymphedema, not elsewhere classified     Problem List Patient Active Problem List   Diagnosis Date Noted  . Pelvic floor dysfunction 09/08/2019  . DOE (dyspnea on exertion) 07/14/2019  . History of 2019 novel coronavirus disease (COVID-19) 07/14/2019  . Elevated ferritin 05/03/2019  . Pneumonia due to COVID-19 virus 03/31/2019  . Acute respiratory failure with hypoxia ([place]) 03/31/2019  . Hypokalemia 03/31/2019  . AKI (acute kidney injury) ([place]) 03/31/2019  . Vertigo 12/12/2018  . Hypothyroidism due to acquired atrophy of thyroid 12/11/2018  . Chronic pain due to trauma 12/11/2018  . Controlled substance agreement signed 12/11/2018  . History of closed head injury 04/21/2018  . Nausea without vomiting 01/21/2017  . Pain management 01/21/2017  . Abdominal pain, epigastric 01/21/2017  . Chronic GERD 01/21/2017  . AP (abdominal pain) 03/22/2015  . Ventral hernia without obstruction or gangrene 03/22/2015  . Vitamin D deficiency 09/13/2013  . Chest pain 09/04/2013  . Metabolic syndrome 84/13/2440  . Migraine headache, history of   . Lumbosacral spondylosis without myelopathy   . Diverticulitis of colon (without mention of hemorrhage)(562.11)   . Morbid obesity ([place])   . Fatigue, chronic   . Female stress incontinence   . Hypertension   . Hematuria   . Hyperlipidemia   . Benign neoplasm of colon   . Stricture  and stenosis of esophagus     Rayetta Humphrey, PT CLT [phone] 11/19/2019, 8:59 AM  [place], [place], [address] Phone: [phone]   Fax:  [phone]  Name: Selena Hunt MRN: [id number] Date of Birth: 04-06-1951

## 2019-11-22 DIAGNOSIS — I890 Lymphedema, not elsewhere classified: Secondary | ICD-10-CM | POA: Diagnosis not present

## 2019-11-30 ENCOUNTER — Encounter: Payer: Self-pay | Admitting: *Deleted

## 2019-11-30 ENCOUNTER — Other Ambulatory Visit: Payer: Self-pay | Admitting: Family Medicine

## 2019-11-30 DIAGNOSIS — I890 Lymphedema, not elsewhere classified: Secondary | ICD-10-CM | POA: Diagnosis not present

## 2019-12-01 ENCOUNTER — Other Ambulatory Visit: Payer: Self-pay | Admitting: *Deleted

## 2019-12-01 DIAGNOSIS — I10 Essential (primary) hypertension: Secondary | ICD-10-CM

## 2019-12-01 MED ORDER — METOPROLOL TARTRATE 50 MG PO TABS: 25.0000 mg | ORAL_TABLET | Freq: Every day | ORAL | 0 refills | Status: DC

## 2019-12-09 ENCOUNTER — Other Ambulatory Visit: Payer: Self-pay

## 2019-12-09 ENCOUNTER — Ambulatory Visit (INDEPENDENT_AMBULATORY_CARE_PROVIDER_SITE_OTHER): Payer: Medicare Other | Admitting: Family Medicine

## 2019-12-09 ENCOUNTER — Encounter: Payer: Self-pay | Admitting: Family Medicine

## 2019-12-09 VITALS — BP 121/62 | HR 99 | Temp 97.6°F | Ht 61.0 in | Wt 195.8 lb

## 2019-12-09 DIAGNOSIS — G8921 Chronic pain due to trauma: Secondary | ICD-10-CM

## 2019-12-09 DIAGNOSIS — N760 Acute vaginitis: Secondary | ICD-10-CM | POA: Diagnosis not present

## 2019-12-09 DIAGNOSIS — J9691 Respiratory failure, unspecified with hypoxia: Secondary | ICD-10-CM

## 2019-12-09 DIAGNOSIS — R7989 Other specified abnormal findings of blood chemistry: Secondary | ICD-10-CM | POA: Diagnosis not present

## 2019-12-09 DIAGNOSIS — B9689 Other specified bacterial agents as the cause of diseases classified elsewhere: Secondary | ICD-10-CM

## 2019-12-09 DIAGNOSIS — R600 Localized edema: Secondary | ICD-10-CM | POA: Diagnosis not present

## 2019-12-09 MED ORDER — HYDROCODONE-ACETAMINOPHEN 5-325 MG PO TABS: 1.0000 | ORAL_TABLET | Freq: Every day | ORAL | 0 refills | Status: DC | PRN

## 2019-12-09 MED ORDER — METRONIDAZOLE 0.75 % VA GEL: 1.0000 | Freq: Every day | VAGINAL | 0 refills | Status: AC

## 2019-12-09 NOTE — Progress Notes (Signed)
Subjective: CC: respiratory failure secondary to Covid infection, lower extremity edema  PCP: Janora Norlander, DO [facility]:TDSK E Mcglocklin is a 69 y.o. female presenting to clinic today for:  1.  Respiratory failure on supplemental O2 Patient was hospitalized for Covid infection and had subsequently developed hypoxia.  She has been on supplemental O2 since that time.    Patient resumed use of Breo because the [facility] was causing yet increased thrush in the mouth.  She still has some thrush but not to the extent that the switch was causing.  She reports compliance with this.  She is self weaned oxygen down to 1 L as needed but does continue to use it quite frequently during the daytime.  She has not yet let go of the 1/2 L she uses at bedtime.  She admits that anxiety may be playing a part but she is had hypoxia in the morning to the 70s and also tachycardia associated with this.  She is never had a sleep study nor has her pulmonologist mentioned 1.  She denies any snoring or frequent awakenings.  2.  Lower extremity edema Improving with lymphedema therapy.  She has pumps at home and this is relieving the excessive fluid she had on her legs.  She continues to have quite a bit of swelling but it is much better than previous.  Skin is no longer "cracking".  She also admits that she was using her hydrocodone more frequently due to excessive pain during lymphedema treatment.  She is no longer using it regularly but only has a few tablets left and would like to get a renewal on this.  3.  Vaginal odor Patient reports a vaginal odor that has been present ever since she started using depends pads.  She has frequent urinary incontinence.  Does not report any significant discharge.  She has had issues with intermittent UTIs but does not have the symptoms presently.  She thinks that her vaginal pH is off is wanting recommendations for this.  ROS: Per HPI  Allergies  Allergen Reactions  . Ambien [Zolpidem  Tartrate] Anxiety  . Diovan [Valsartan] Other (See Comments)    Seizures.  . Wellbutrin [Bupropion] Other (See Comments)    Seizures.  . Codeine     Feel crazy and dizzy   . Feldene [Piroxicam]     Swelling   . Guaifenesin Er     Raise Bp / HR   . Latex     Swelling   . Mobic [Meloxicam] Other (See Comments)    Stomach cramps  . Neurontin [Gabapentin]     "loopy"  . Crestor [Rosuvastatin Calcium] Other (See Comments)    Myalgias   . Lipitor [Atorvastatin Calcium] Other (See Comments)    myalgias  . Livalo [Pitavastatin] Swelling and Other (See Comments)    stomach cramping   . Simcor [Niacin-Simvastatin Er] Other (See Comments)    Stomach cramps  . Zocor [Simvastatin] Other (See Comments)    Stomach cramps   Past Medical History:  Diagnosis Date  . Benign neoplasm of colon   . Blood transfusion without reported diagnosis    x 34 to date  . Chronic kidney disease   . Colon polyps   . Diverticulitis of colon (without mention of hemorrhage)(562.11)   . Diverticulosis of colon (without mention of hemorrhage)   . Essential hypertension, benign   . Female stress incontinence   . GERD (gastroesophageal reflux disease)   . Hematuria   . Hernia of  unspecified site of abdominal cavity without mention of obstruction or gangrene   . History of closed head injury 04/21/2018  . Kidney stones    has had one   . Lumbosacral spondylosis without myelopathy   . Meniere disease    Drs are uncertain of this DX   . Migraine headache   . Neuromuscular disorder ([place])   . Obesity   . Other and unspecified hyperlipidemia   . Pneumonia   . Pulmonary embolism ([place])   . Seizures ([place])    HX of --- from medications,2003  . Stricture and stenosis of esophagus   . Thyroid disease   . Ventral hernia     Current Outpatient Medications:  .  albuterol (PROVENTIL) (2.5 MG/3ML) 0.083% nebulizer solution, Take 3 mLs (2.5 mg total) by nebulization every 6 (six) hours as needed for wheezing or  shortness of breath., Disp: 150 mL, Rfl: 1 .  albuterol (VENTOLIN HFA) 108 (90 Base) MCG/ACT inhaler, Inhale 2 puffs into the lungs every 6 (six) hours as needed for wheezing or shortness of breath., Disp: 8 g, Rfl: 0 .  aspirin EC 81 MG tablet, Take 81 mg by mouth daily., Disp: , Rfl:  .  calcium-vitamin D (OSCAL WITH D) 500-200 MG-UNIT tablet, Take 1 tablet by mouth daily with breakfast., Disp: 90 tablet, Rfl: 1 .  esomeprazole (NEXIUM) 40 MG capsule, Take 1 capsule (40 mg total) by mouth 2 (two) times daily before a meal., Disp: 60 capsule, Rfl: 3 .  fluticasone (FLONASE) 50 MCG/ACT nasal spray, Place 2 sprays into both nostrils daily., Disp: 16 g, Rfl: 6 .  HYDROcodone-acetaminophen (NORCO/VICODIN) 5-325 MG tablet, Take 1 tablet by mouth daily as needed for moderate pain or severe pain., Disp: 30 tablet, Rfl: 0 .  loratadine (CLARITIN) 10 MG tablet, Take 1 tablet (10 mg total) by mouth daily., Disp: 30 tablet, Rfl: 11 .  meclizine (ANTIVERT) 25 MG tablet, Take 1 tablet (25 mg total) by mouth every 8 (eight) hours as needed for dizziness., Disp: 30 tablet, Rfl: 2 .  metoprolol tartrate (LOPRESSOR) 50 MG tablet, Take 0.5 tablets (25 mg total) by mouth daily., Disp: 45 tablet, Rfl: 0 .  Multiple Vitamins-Minerals (WOMENS 50+ ADVANCED PO), Take by mouth., Disp: , Rfl:  .  oxybutynin (DITROPAN-XL) 10 MG 24 hr tablet, TAKE 1 TABLET BY MOUTH AT BEDTIME, Disp: 90 tablet, Rfl: 0 .  pravastatin (PRAVACHOL) 20 MG tablet, TAKE 1 TABLET BY MOUTH EVERY DAY, Disp: 90 tablet, Rfl: 0 .  SYNTHROID 100 MCG tablet, One whole tablet by mouth on MON and FRI and 1/2 tab all other days, Disp: 60 tablet, Rfl: 1 .  triamterene-hydrochlorothiazide (MAXZIDE-25) 37.5-25 MG tablet, Take 1 tablet by mouth daily. STOP lasix, Disp: 90 tablet, Rfl: 0 .  Vitamin D, Ergocalciferol, (DRISDOL) 1.25 MG (50000 UT) CAPS capsule, TAKE 1 CAPSULE BY MOUTH EVERY 7 DAYS, Disp: 12 capsule, Rfl: 3 Social History   Socioeconomic History  .  Marital status: Widowed    Spouse name: Krystyn Picking  . Number of children: 2  . Years of education: Not on file  . Highest education level: 12th grade  Occupational History  . 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 Smoking status: Never Smoker  . Smokeless tobacco: Never Used  Vaping Use  . Vaping Use: Never used  Substance and Sexual Activity  . Alcohol use: Not Currently    Alcohol/week: 0.0 standard  drinks    Comment: wine once or twice a year  . Drug use: No  . Sexual activity: Never    Birth control/protection: Post-menopausal  Other Topics Concern  . Not on file  Social History Narrative   Lives alone.    Caffeine 2 cups daily    Right handed   Social Determinants of Health   Financial Resource Strain:   . Difficulty of Paying Living Expenses: Not on file  Food Insecurity:   . Worried About Charity fundraiser in the Last Year: Not on file  . Ran Out of Food in the Last Year: Not on file  Transportation Needs:   . Lack of Transportation (Medical): Not on file  . Lack of Transportation (Non-Medical): Not on file  Physical Activity:   . Days of Exercise per Week: Not on file  . Minutes of Exercise per Session: Not on file  Stress:   . Feeling of Stress : Not on file  Social Connections:   . Frequency of Communication with Friends and Family: Not on file  . Frequency of Social Gatherings with Friends and Family: Not on file  . Attends Religious Services: Not on file  . Active Member of Clubs or Organizations: Not on file  . Attends Archivist Meetings: Not on file  . Marital Status: Not on file  Intimate Partner Violence:   . Fear of Current or Ex-Partner: Not on file  . Emotionally Abused: Not on file  . Physically Abused: Not on file  . Sexually Abused: Not on file   Family History  Problem Relation Age of Onset  . Alzheimer's disease Mother   . Hypertension Mother   . Stroke Mother   .  Alzheimer's disease Father   . Hypertension Father   . Gout Brother   . Fibromyalgia Daughter   . Hypertension Son   . Depression Son   . Spondylitis Son        spondylosis  . GER disease Son   . Heart disease Maternal Grandmother   . Heart disease Maternal Grandfather   . Congestive Heart Failure Maternal Grandfather   . Stroke Paternal Grandmother   . Hypertension Paternal Grandmother   . Depression Paternal Grandfather   . Suicidality Paternal Grandfather   . Colon cancer Neg Hx   . Esophageal cancer Neg Hx   . Pancreatic cancer Neg Hx   . Stomach cancer Neg Hx   . Liver disease Neg Hx     Objective: Office vital signs reviewed. BP 121/62   Pulse 99   Temp 97.6 F (36.4 C)   Ht 5\' 1"  (1.549 m)   Wt 195 lb 12.8 oz (88.8 kg)   SpO2 95% Comment: 1L of o2  BMI 37.00 kg/m   Physical Examination:  General: Awake, alert, No acute distress HEENT: Normal; no goiter.  No exophthalmos Cardio: regular rate and rhythm, S1S2 heard, no murmurs appreciated Pulm: clear to auscultation bilaterally, no wheezes, rhonchi or rales; normal work of breathing on 2 L of oxygen Extremities: warm, well perfused, nonpitting edema noted to mid shin, no cyanosis or clubbing; +2 pulses bilaterally; she has contracture of the left hand  MSK: Antalgic gait and station  Assessment/ Plan: 69 y.o. female   1. Respiratory failure with hypoxia, unspecified chronicity (HCC) Stable on 1 L of O2.  Agree with gradual weaning from this.  Agree with pulmonary therapy  2. Lower extremity edema Improving.  Continue lymphedema treatment. - Renal Function Panel  3.  Bacterial vaginosis Suspect bacterial vaginosis.  Empiric treatment with topical metronidazole.  Encouraged vaginal health probiotic.  We discussed if this continues to recur we should consider suppressive therapy.  She will contact me if this is needed - metroNIDAZOLE (METROGEL VAGINAL) 0.75 % vaginal gel; Place 1 Applicatorful vaginally at  bedtime for 7 days.  Dispense: 70 g; Refill: 0  5. Chronic pain due to trauma Using Norco for above discomfort.  She normally does not refill this frequently and therefore I have gone ahead and sent in a new renewal.  She will follow-up with me in 4 to 6 months, sooner if needed.  The national narcotic database was reviewed and there were no red flags. - HYDROcodone-acetaminophen (NORCO/VICODIN) 5-325 MG tablet; Take 1 tablet by mouth daily as needed for moderate pain or severe pain.  Dispense: 30 tablet; Refill: 0  6. Elevated serum creatinine Check renal function panel - Renal Function Panel   No orders of the defined types were placed in this encounter.  No orders of the defined types were placed in this encounter.    Janora Norlander, DO [place] [phone]

## 2019-12-09 NOTE — Patient Instructions (Signed)
Bacterial Vaginosis  Bacterial vaginosis is a vaginal infection that occurs when the normal balance of bacteria in the vagina is disrupted. It results from an overgrowth of certain bacteria. This is the most common vaginal infection among women ages 15-44. Because bacterial vaginosis increases your risk for STIs (sexually transmitted infections), getting treated can help reduce your risk for chlamydia, gonorrhea, herpes, and HIV (human immunodeficiency virus). Treatment is also important for preventing complications in pregnant women, because this condition can cause an early (premature) delivery. What are the causes? This condition is caused by an increase in harmful bacteria that are normally present in small amounts in the vagina. However, the reason that the condition develops is not fully understood. What increases the risk? The following factors may make you more likely to develop this condition:  Having a new sexual partner or multiple sexual partners.  Having unprotected sex.  Douching.  Having an intrauterine device (IUD).  Smoking.  Drug and alcohol abuse.  Taking certain antibiotic medicines.  Being pregnant. You cannot get bacterial vaginosis from toilet seats, bedding, swimming pools, or contact with objects around you. What are the signs or symptoms? Symptoms of this condition include:  [place] or white vaginal discharge. The discharge can also be watery or foamy.  A fish-like odor with discharge, especially after sexual intercourse or during menstruation.  Itching in and around the vagina.  Burning or pain with urination. Some women with bacterial vaginosis have no signs or symptoms. How is this diagnosed? This condition is diagnosed based on:  Your medical history.  A physical exam of the vagina.  Testing a sample of vaginal fluid under a microscope to look for a large amount of bad bacteria or abnormal cells. Your health care provider may use a cotton swab or  a small wooden spatula to collect the sample. How is this treated? This condition is treated with antibiotics. These may be given as a pill, a vaginal cream, or a medicine that is put into the vagina (suppository). If the condition comes back after treatment, a second round of antibiotics may be needed. Follow these instructions at home: Medicines  Take over-the-counter and prescription medicines only as told by your health care provider.  Take or use your antibiotic as told by your health care provider. Do not stop taking or using the antibiotic even if you start to feel better. General instructions  If you have a female sexual partner, tell her that you have a vaginal infection. She should see her health care provider and be treated if she has symptoms. If you have a female sexual partner, he does not need treatment.  During treatment: ? Avoid sexual activity until you finish treatment. ? Do not douche. ? Avoid alcohol as directed by your health care provider. ? Avoid breastfeeding as directed by your health care provider.  Drink enough water and fluids to keep your urine clear or pale yellow.  Keep the area around your vagina and rectum clean. ? Wash the area daily with warm water. ? Wipe yourself from front to back after using the toilet.  Keep all follow-up visits as told by your health care provider. This is important. How is this prevented?  Do not douche.  Wash the outside of your vagina with warm water only.  Use protection when having sex. This includes latex condoms and dental dams.  Limit how many sexual partners you have. To help prevent bacterial vaginosis, it is best to have sex with just one partner (  monogamous).  Make sure you and your sexual partner are tested for STIs.  Wear cotton or cotton-lined underwear.  Avoid wearing tight pants and pantyhose, especially during summer.  Limit the amount of alcohol that you drink.  Do not use any products that contain  nicotine or tobacco, such as cigarettes and e-cigarettes. If you need help quitting, ask your health care provider.  Do not use illegal drugs. Where to find more information  Centers for Disease Control and Prevention: www.cdc.gov/std  American Sexual Health Association (ASHA): www.ashastd.org  U.S. Department of Health and Human Services, Office on Women's Health: www.womenshealth.gov/ or https://www.womenshealth.gov/a-z-topics/bacterial-vaginosis Contact a health care provider if:  Your symptoms do not improve, even after treatment.  You have more discharge or pain when urinating.  You have a fever.  You have pain in your abdomen.  You have pain during sex.  You have vaginal bleeding between periods. Summary  Bacterial vaginosis is a vaginal infection that occurs when the normal balance of bacteria in the vagina is disrupted.  Because bacterial vaginosis increases your risk for STIs (sexually transmitted infections), getting treated can help reduce your risk for chlamydia, gonorrhea, herpes, and HIV (human immunodeficiency virus). Treatment is also important for preventing complications in pregnant women, because the condition can cause an early (premature) delivery.  This condition is treated with antibiotic medicines. These may be given as a pill, a vaginal cream, or a medicine that is put into the vagina (suppository). This information is not intended to replace advice given to you by your health care provider. Make sure you discuss any questions you have with your health care provider. Document Revised: 03/07/2017 Document Reviewed: 12/09/2015 Elsevier Patient Education  2020 Elsevier Inc.  

## 2019-12-10 ENCOUNTER — Ambulatory Visit: Payer: Medicare Other | Admitting: Family Medicine

## 2019-12-10 LAB — RENAL FUNCTION PANEL
Albumin: 4.3 g/dL (ref 3.8–4.8)
BUN/Creatinine Ratio: 27 (ref 12–28)
BUN: 28 mg/dL — ABNORMAL HIGH (ref 8–27)
CO2: 30 mmol/L — ABNORMAL HIGH (ref 20–29)
Calcium: 9.9 mg/dL (ref 8.7–10.3)
Chloride: 99 mmol/L (ref 96–106)
Creatinine, Ser: 1.04 mg/dL — ABNORMAL HIGH (ref 0.57–1.00)
GFR calc Af Amer: 63 mL/min/{1.73_m2} (ref >59)
GFR calc non Af Amer: 55 mL/min/{1.73_m2} — ABNORMAL LOW (ref >59)
Glucose: 90 mg/dL (ref 65–99)
Phosphorus: 2.9 mg/dL — ABNORMAL LOW (ref 3.0–4.3)
Potassium: 3.8 mmol/L (ref 3.5–5.2)
Sodium: 142 mmol/L (ref 134–144)

## 2019-12-19 DIAGNOSIS — U071 COVID-19: Secondary | ICD-10-CM | POA: Diagnosis not present

## 2019-12-19 DIAGNOSIS — J1282 Pneumonia due to coronavirus disease 2019: Secondary | ICD-10-CM | POA: Diagnosis not present

## 2019-12-20 DIAGNOSIS — H9041 Sensorineural hearing loss, unilateral, right ear, with unrestricted hearing on the contralateral side: Secondary | ICD-10-CM | POA: Diagnosis not present

## 2019-12-28 ENCOUNTER — Other Ambulatory Visit: Payer: Self-pay | Admitting: *Deleted

## 2019-12-28 DIAGNOSIS — E559 Vitamin D deficiency, unspecified: Secondary | ICD-10-CM

## 2020-01-03 ENCOUNTER — Encounter: Payer: Self-pay | Admitting: Nurse Practitioner

## 2020-01-03 ENCOUNTER — Ambulatory Visit (INDEPENDENT_AMBULATORY_CARE_PROVIDER_SITE_OTHER): Payer: Medicare Other | Admitting: Nurse Practitioner

## 2020-01-03 DIAGNOSIS — N3000 Acute cystitis without hematuria: Secondary | ICD-10-CM

## 2020-01-03 LAB — URINALYSIS, COMPLETE
Bilirubin, UA: NEGATIVE
Glucose, UA: NEGATIVE
Ketones, UA: NEGATIVE
Nitrite, UA: POSITIVE — AB
Protein,UA: NEGATIVE
Specific Gravity, UA: 1.02 (ref 1.005–1.030)
Urobilinogen, Ur: 0.2 mg/dL (ref 0.2–1.0)
pH, UA: 8 — ABNORMAL HIGH (ref 5.0–7.5)

## 2020-01-03 LAB — MICROSCOPIC EXAMINATION: RBC, Urine: NONE SEEN /HPF (ref 0–2)

## 2020-01-03 MED ORDER — CEPHALEXIN 500 MG PO CAPS: 500.0000 mg | ORAL_CAPSULE | Freq: Two times a day (BID) | ORAL | 0 refills | Status: DC

## 2020-01-03 NOTE — Progress Notes (Signed)
   Virtual Visit via telephone Note Due to COVID-19 pandemic this visit was conducted virtually. This visit type was conducted due to national recommendations for restrictions regarding the COVID-19 Pandemic (e.g. social distancing, sheltering in place) in an effort to limit this patient's exposure and mitigate transmission in our community. All issues noted in this document were discussed and addressed.  A physical exam was not performed with this format.  I connected with Selena Hunt on 01/03/20 at 2:30 by telephone and verified that I am speaking with the correct person using two identifiers. Selena Hunt is currently located at home and no one is currently with her during visit. The provider, Mary-Margaret Hassell Done, FNP is located in their office at time of visit.  I discussed the limitations, risks, security and privacy concerns of performing an evaluation and management service by telephone and the availability of in person appointments. I also discussed with the patient that there may be a patient responsible charge related to this service. The patient expressed understanding and agreed to proceed.   History and Present Illness:   Chief Complaint: Urinary Tract Infection   HPI Patient says last Thursday she developed urgency abd dysuria. She started taking AZO and that has helped some. Now her urine is real cloudy and smells funny. Has slight low back pain. Denies vaginal discharge  * was treated for bacterial vaginosis 12/09/19 with metrogel.  Review of Systems  Constitutional: Negative.   Cardiovascular: Negative.   Genitourinary: Positive for dysuria, frequency and urgency. Negative for flank pain and hematuria.  Neurological: Negative.   Psychiatric/Behavioral: Negative.   All other systems reviewed and are negative.    Observations/Objective: Alert and oriented- answers all questions appropriately No distress    Assessment and Plan: Selena Hunt in today with chief  complaint of Urinary Tract Infection   1. Acute cystitis without hematuria Take medication as prescribe Cotton underwear Take shower not bath Cranberry juice, yogurt Force fluids AZO over the counter X2 days Culture pending RTO prn  - cephALEXin (KEFLEX) 500 MG capsule; Take 1 capsule (500 mg total) by mouth 2 (two) times daily.  Dispense: 14 capsule; Refill: 0 - Urinalysis, Complete - Urine Culture     Follow Up Instructions: prn    I discussed the assessment and treatment plan with the patient. The patient was provided an opportunity to ask questions and all were answered. The patient agreed with the plan and demonstrated an understanding of the instructions.   The patient was advised to call back or seek an in-person evaluation if the symptoms worsen or if the condition fails to improve as anticipated.  The above assessment and management plan was discussed with the patient. The patient verbalized understanding of and has agreed to the management plan. Patient is aware to call the clinic if symptoms persist or worsen. Patient is aware when to return to the clinic for a follow-up visit. Patient educated on when it is appropriate to go to the emergency department.   Time call ended:  2:45  I provided 15 minutes of non-face-to-face time during this encounter.    Mary-Margaret Hassell Done, FNP

## 2020-01-05 DIAGNOSIS — I890 Lymphedema, not elsewhere classified: Secondary | ICD-10-CM | POA: Diagnosis not present

## 2020-01-05 LAB — URINE CULTURE

## 2020-01-09 ENCOUNTER — Other Ambulatory Visit: Payer: Self-pay | Admitting: Family Medicine

## 2020-01-09 DIAGNOSIS — R600 Localized edema: Secondary | ICD-10-CM

## 2020-01-14 ENCOUNTER — Other Ambulatory Visit: Payer: Self-pay | Admitting: *Deleted

## 2020-01-14 DIAGNOSIS — E559 Vitamin D deficiency, unspecified: Secondary | ICD-10-CM

## 2020-01-18 DIAGNOSIS — U071 COVID-19: Secondary | ICD-10-CM | POA: Diagnosis not present

## 2020-01-18 DIAGNOSIS — J1282 Pneumonia due to coronavirus disease 2019: Secondary | ICD-10-CM | POA: Diagnosis not present

## 2020-01-21 ENCOUNTER — Telehealth: Payer: Medicare Other

## 2020-01-24 ENCOUNTER — Ambulatory Visit (INDEPENDENT_AMBULATORY_CARE_PROVIDER_SITE_OTHER): Payer: Medicare Other | Admitting: Family Medicine

## 2020-01-24 DIAGNOSIS — J209 Acute bronchitis, unspecified: Secondary | ICD-10-CM

## 2020-01-24 DIAGNOSIS — J9691 Respiratory failure, unspecified with hypoxia: Secondary | ICD-10-CM | POA: Diagnosis not present

## 2020-01-24 DIAGNOSIS — N390 Urinary tract infection, site not specified: Secondary | ICD-10-CM

## 2020-01-24 MED ORDER — FLUCONAZOLE 150 MG PO TABS: 150.0000 mg | ORAL_TABLET | Freq: Once | ORAL | 0 refills | Status: AC

## 2020-01-24 MED ORDER — DOXYCYCLINE HYCLATE 100 MG PO TABS: 100.0000 mg | ORAL_TABLET | Freq: Two times a day (BID) | ORAL | 0 refills | Status: AC

## 2020-01-24 MED ORDER — PREDNISONE 20 MG PO TABS: 40.0000 mg | ORAL_TABLET | Freq: Every day | ORAL | 0 refills | Status: AC

## 2020-01-24 NOTE — Progress Notes (Signed)
Telephone visit  Subjective: CC: Bronchitis PCP: Janora Norlander, DO MEQ:ASTM Selena Hunt is a 69 y.o. female calls for telephone consult today. Patient provides verbal consent for consult held via phone.  Due to COVID-19 pandemic this visit was conducted virtually. This visit type was conducted due to national recommendations for restrictions regarding the COVID-19 Pandemic (Selena.g. social distancing, sheltering in place) in an effort to limit this patient's exposure and mitigate transmission in our community. All issues noted in this document were discussed and addressed.  A physical exam was not performed with this format.   Location of patient: home Location of provider: [facility] Others present for call: none  1. Bronchitis She reports that she has sinus drainage that "is making it's way into her chest".  She has been taking Zinc and Vitamin C.  She feels that Mucinex causes heart palpitations.  No fevers.  Cough is productive, yellow green phlegm.  No hemoptysis.  2 recurrent UTI Patient diagnosed with yet another urinary tract infection on 27 September.  She reported a urinary odor coming back.  Her odor seems to be refractory to the topical metronidazole that was prescribed but it did totally resolve after Keflex.  Her urine culture grew multidrug-resistant Selena. coli.  She has seen urology in [place] previously but has not followed up in quite some time.   ROS: Per HPI  Allergies  Allergen Reactions  . Ambien [Zolpidem Tartrate] Anxiety  . Diovan [Valsartan] Other (See Comments)    Seizures.  . Wellbutrin [Bupropion] Other (See Comments)    Seizures.  . Codeine     Feel crazy and dizzy   . Feldene [Piroxicam]     Swelling   . Guaifenesin Er     Raise Bp / HR   . Latex     Swelling   . Mobic [Meloxicam] Other (See Comments)    Stomach cramps  . Neurontin [Gabapentin]     "loopy"  . Crestor [Rosuvastatin Calcium] Other (See Comments)    Myalgias   . Lipitor [Atorvastatin  Calcium] Other (See Comments)    myalgias  . Livalo [Pitavastatin] Swelling and Other (See Comments)    stomach cramping   . Simcor [Niacin-Simvastatin Er] Other (See Comments)    Stomach cramps  . Zocor [Simvastatin] Other (See Comments)    Stomach cramps   Past Medical History:  Diagnosis Date  . Benign neoplasm of colon   . Blood transfusion without reported diagnosis    x 34 to date  . Chronic kidney disease   . Colon polyps   . Diverticulitis of colon (without mention of hemorrhage)(562.11)   . Diverticulosis of colon (without mention of hemorrhage)   . Essential hypertension, benign   . Female stress incontinence   . GERD (gastroesophageal reflux disease)   . Hematuria   . Hernia of unspecified site of abdominal cavity without mention of obstruction or gangrene   . History of closed head injury 04/21/2018  . Kidney stones    has had one   . Lumbosacral spondylosis without myelopathy   . Lymphedema   . Meniere disease    Drs are uncertain of this DX   . Migraine headache   . Neuromuscular disorder ([place])   . Obesity   . Other and unspecified hyperlipidemia   . Pneumonia   . Pulmonary embolism ([place])   . Seizures ([place])    HX of --- from medications,2003  . Stricture and stenosis of esophagus   . Thyroid disease   .  Ventral hernia     Current Outpatient Medications:  .  albuterol (PROVENTIL) (2.5 MG/3ML) 0.083% nebulizer solution, Take 3 mLs (2.5 mg total) by nebulization every 6 (six) hours as needed for wheezing or shortness of breath., Disp: 150 mL, Rfl: 1 .  albuterol (VENTOLIN HFA) 108 (90 Base) MCG/ACT inhaler, Inhale 2 puffs into the lungs every 6 (six) hours as needed for wheezing or shortness of breath., Disp: 8 g, Rfl: 0 .  aspirin EC 81 MG tablet, Take 81 mg by mouth daily., Disp: , Rfl:  .  calcium-vitamin D (OSCAL WITH D) 500-200 MG-UNIT tablet, Take 1 tablet by mouth daily with breakfast., Disp: 90 tablet, Rfl: 1 .  cephALEXin (KEFLEX) 500 MG capsule,  Take 1 capsule (500 mg total) by mouth 2 (two) times daily., Disp: 14 capsule, Rfl: 0 .  esomeprazole (NEXIUM) 40 MG capsule, Take 1 capsule (40 mg total) by mouth 2 (two) times daily before a meal., Disp: 60 capsule, Rfl: 3 .  fluticasone (FLONASE) 50 MCG/ACT nasal spray, Place 2 sprays into both nostrils daily., Disp: 16 g, Rfl: 6 .  HYDROcodone-acetaminophen (NORCO/VICODIN) 5-325 MG tablet, Take 1 tablet by mouth daily as needed for moderate pain or severe pain., Disp: 30 tablet, Rfl: 0 .  loratadine (CLARITIN) 10 MG tablet, Take 1 tablet (10 mg total) by mouth daily., Disp: 30 tablet, Rfl: 11 .  meclizine (ANTIVERT) 25 MG tablet, Take 1 tablet (25 mg total) by mouth every 8 (eight) hours as needed for dizziness., Disp: 30 tablet, Rfl: 2 .  metoprolol tartrate (LOPRESSOR) 50 MG tablet, Take 0.5 tablets (25 mg total) by mouth daily., Disp: 45 tablet, Rfl: 0 .  Multiple Vitamins-Minerals (WOMENS 50+ ADVANCED PO), Take by mouth., Disp: , Rfl:  .  oxybutynin (DITROPAN-XL) 10 MG 24 hr tablet, TAKE 1 TABLET BY MOUTH AT BEDTIME, Disp: 90 tablet, Rfl: 0 .  pravastatin (PRAVACHOL) 20 MG tablet, TAKE 1 TABLET BY MOUTH EVERY DAY, Disp: 90 tablet, Rfl: 0 .  SYNTHROID 100 MCG tablet, One whole tablet by mouth on MON and FRI and 1/2 tab all other days, Disp: 60 tablet, Rfl: 1 .  triamterene-hydrochlorothiazide (MAXZIDE-25) 37.5-25 MG tablet, TAKE 1 TABLET BY MOUTH EVERY DAY, Disp: 90 tablet, Rfl: 0 .  Vitamin D, Ergocalciferol, (DRISDOL) 1.25 MG (50000 UT) CAPS capsule, TAKE 1 CAPSULE BY MOUTH EVERY 7 DAYS, Disp: 12 capsule, Rfl: 3  Assessment/ Plan: 69 y.o. female   1. Acute bronchitis, unspecified organism Empiric treatment with prednisone burst, doxycycline.  She is to continue using her albuterol inhalers as prescribed.  She understands red flag signs and symptoms warranting further evaluation. - predniSONE (DELTASONE) 20 MG tablet; Take 2 tablets (40 mg total) by mouth daily with breakfast for 5 days.   Dispense: 10 tablet; Refill: 0 - doxycycline (VIBRA-TABS) 100 MG tablet; Take 1 tablet (100 mg total) by mouth 2 (two) times daily for 7 days.  Dispense: 14 tablet; Refill: 0 - fluconazole (DIFLUCAN) 150 MG tablet; Take 1 tablet (150 mg total) by mouth once for 1 dose.  Dispense: 1 tablet; Refill: 0  2. Respiratory failure with hypoxia, unspecified chronicity (HCC) - predniSONE (DELTASONE) 20 MG tablet; Take 2 tablets (40 mg total) by mouth daily with breakfast for 5 days.  Dispense: 10 tablet; Refill: 0 - doxycycline (VIBRA-TABS) 100 MG tablet; Take 1 tablet (100 mg total) by mouth 2 (two) times daily for 7 days.  Dispense: 14 tablet; Refill: 0 - fluconazole (DIFLUCAN) 150 MG tablet; Take 1  tablet (150 mg total) by mouth once for 1 dose.  Dispense: 1 tablet; Refill: 0  3. Recurrent UTI She is starting to develop resistance based on her last urine culture.  I have encouraged her to continue adequate hydration, frequent pad changes and make sure that she is on a probiotic.  If this occurs again, low threshold to refer her back to urology in [place].  May need suppressive therapy   Start time: 4:24pm End time: 4:40pm  Total time spent on patient care (including telephone call/ virtual visit): 16 minutes  [place], [place] [phone]

## 2020-01-28 ENCOUNTER — Ambulatory Visit: Payer: Medicare Other | Admitting: *Deleted

## 2020-01-28 DIAGNOSIS — E034 Atrophy of thyroid (acquired): Secondary | ICD-10-CM

## 2020-01-28 DIAGNOSIS — I10 Essential (primary) hypertension: Secondary | ICD-10-CM

## 2020-01-28 NOTE — Chronic Care Management (AMB) (Signed)
  Chronic Care Management   Initial Visit Note  01/28/2020 Name: Selena Hunt MRN: [id number] DOB: [birth year]  Referred by: Janora Norlander, DO Reason for referral : Chronic Care Management (Initial visit)   CARMELIA TINER is a 69 y.o. year old female who is a primary care patient of Janora Norlander, DO. The CCM team was consulted for assistance with chronic disease management and care coordination needs related to HTN and hypothyroidism  Review of patient status, including review of consultants reports, relevant laboratory and other test results, and collaboration with appropriate care team members and the patient's provider was performed as part of comprehensive patient evaluation and provision of chronic care management services.    SDOH (Social Determinants of Health) assessments performed: Yes See Care Plan activities for detailed interventions related to SDOH    I spoke with Ms Stouffer by telephone today regarding management of her chronic medical conditions. She does not have any CCM or resource needs and feels that her medical conditions are well controlled at this time. She has been using a portable oxygen concentrator since having covid in Dec 2020 but has been working with pulmonologist to wean herself off of it. Ms Lie is very active and is able to care for herself. She also cares for her twin 63 year-old great grandchildren several days a way. Ms Hefter appreciated the call but does not wish to be enrolled in CCM services but will reach out in the future if services are needed.    Plan:  The patient has been provided with contact information for the care management team and has been advised to call with any health related questions or concerns.  CCM enrollment status changed to "previously enrolled" as per patient request on 01/28/20 to discontinue enrollment. Case closed to case management services in primary care home.   Chong Sicilian, BSN, RN-BC Embedded Chronic Care  Manager [facility] [place] Management Direct Dial: [phone]

## 2020-01-28 NOTE — Patient Instructions (Signed)
Plan:  The patient has been provided with contact information for the care management team and has been advised to call with any health related questions or concerns.  CCM enrollment status changed to "previously enrolled" as per patient request on 01/28/20 to discontinue enrollment. Case closed to case management services in primary care home.   Chong Sicilian, BSN, RN-BC Embedded Chronic Care Manager [facility] [place] [facility] Direct Dial: [phone]

## 2020-01-31 ENCOUNTER — Other Ambulatory Visit: Payer: Self-pay | Admitting: Family Medicine

## 2020-02-04 ENCOUNTER — Other Ambulatory Visit: Payer: Self-pay

## 2020-02-04 DIAGNOSIS — E559 Vitamin D deficiency, unspecified: Secondary | ICD-10-CM

## 2020-02-04 MED ORDER — VITAMIN D (ERGOCALCIFEROL) 1.25 MG (50000 UNIT) PO CAPS: ORAL_CAPSULE | ORAL | 3 refills | Status: DC

## 2020-02-04 NOTE — Telephone Encounter (Signed)
  Prescription Request  02/04/2020  What is the name of the medication or equipment? Vitamin D  Have you contacted your pharmacy to request a refill? (if applicable) yes  Which pharmacy would you like this sent to?  [facility]   Patient notified that their request is being sent to the clinical staff for review and that they should receive a response within 2 business days.   [facility] pt.  She has been waiting awhile now.  Please call today.

## 2020-02-04 NOTE — Telephone Encounter (Signed)
Patients last vitamin d level on 05/03/19 and her level was 100.

## 2020-02-18 DIAGNOSIS — U071 COVID-19: Secondary | ICD-10-CM | POA: Diagnosis not present

## 2020-02-18 DIAGNOSIS — J1282 Pneumonia due to coronavirus disease 2019: Secondary | ICD-10-CM | POA: Diagnosis not present

## 2020-02-29 ENCOUNTER — Other Ambulatory Visit: Payer: Self-pay | Admitting: Family Medicine

## 2020-02-29 DIAGNOSIS — I10 Essential (primary) hypertension: Secondary | ICD-10-CM

## 2020-03-14 ENCOUNTER — Ambulatory Visit: Payer: PRIVATE HEALTH INSURANCE | Admitting: Cardiology

## 2020-03-14 ENCOUNTER — Encounter: Payer: Self-pay | Admitting: Cardiology

## 2020-03-14 ENCOUNTER — Telehealth: Payer: Self-pay | Admitting: Cardiology

## 2020-03-14 ENCOUNTER — Telehealth (INDEPENDENT_AMBULATORY_CARE_PROVIDER_SITE_OTHER): Payer: Medicare Other | Admitting: Cardiology

## 2020-03-14 VITALS — Ht 61.5 in | Wt 195.0 lb

## 2020-03-14 DIAGNOSIS — R0602 Shortness of breath: Secondary | ICD-10-CM | POA: Diagnosis not present

## 2020-03-14 DIAGNOSIS — I890 Lymphedema, not elsewhere classified: Secondary | ICD-10-CM

## 2020-03-14 DIAGNOSIS — R600 Localized edema: Secondary | ICD-10-CM | POA: Diagnosis not present

## 2020-03-14 NOTE — Progress Notes (Signed)
Virtual Visit via Telephone Note   This visit type was conducted due to national recommendations for restrictions regarding the COVID-19 Pandemic (e.g. social distancing) in an effort to limit this patient's exposure and mitigate transmission in our community.  Due to her co-morbid illnesses, this patient is at least at moderate risk for complications without adequate follow up.  This format is felt to be most appropriate for this patient at this time.  The patient did not have access to video technology/had technical difficulties with video requiring transitioning to audio format only (telephone).  All issues noted in this document were discussed and addressed.  No physical exam could be performed with this format.  Please refer to the patient's chart for her  consent to telehealth for [facility].    Date:  03/14/2020   ID:  Selena Hunt, DOB 05-27-1950, MRN [id number] The patient was identified using 2 identifiers.  Patient Location: Home Provider Location: Office/Clinic  PCP:  Janora Norlander, DO  Cardiologist:  Carlyle Dolly, MD  Electrophysiologist:  None   Evaluation Performed:  Follow-Up Visit  Chief Complaint:  Follow up visit  History of Present Illness:    Selena Hunt is a 69 y.o. female seen today for follow up of the following medical problems.    1. History of SOB/Arm pain - was evaluated by Dr Percival Spanish in 2017 - 09/2015 nuclear stress no ischemia  - no recent symptoms   2. COVID 19 infection in Dec 2020/SOB - has been on home O2  06/04/19 CXR stable intersitial densities, most conistent with scarring - remains on 2L [place] - DOE just walking in from parking lot - has appt with pulmonary - she reports chronic left sided swelling. Some increase in bilateral swelling - some AKI, diuretic was lowered. Takes lasix 20mg  qod. She is off maxzide.  12/2018 weight 194 lbsprecovid, today she is 197 lbs - prior to covid worked out at [employer] without  any symptoms, since ongoing DOE remains on 2L [place]   06/2019 echo LVEF 55-73%, grade I diastolic dysfunction, nomral RV, mild MR, - ongoing SOB, O2 requirement.    - weaning O2 at home. Breathing is improving.  - followed by pulmonary - awaiting pulmonary rehab  3.Labileheart rates - she reports up and down heart rates. Can be as low 35, as high as 140s.One time up to 190s. She has been monitoring with her pulse oximeter - isoalted episode of palpitations.  06/2019 heart monitor no arrhythmias - denies any recnet symptoms  4. LE edema - lasix caused dramatic diuretic response. Does better with HCTZ.  - prior left leg injury  - completed lymphedema clinic, doing home treatments. Swelling is much improved   The patient does not have symptoms concerning for COVID-19 infection (fever, chills, cough, or new shortness of breath).    Past Medical History:  Diagnosis Date  . Benign neoplasm of colon   . Blood transfusion without reported diagnosis    x 34 to date  . Chronic kidney disease   . Colon polyps   . Diverticulitis of colon (without mention of hemorrhage)(562.11)   . Diverticulosis of colon (without mention of hemorrhage)   . Essential hypertension, benign   . Female stress incontinence   . GERD (gastroesophageal reflux disease)   . Hematuria   . Hernia of unspecified site of abdominal cavity without mention of obstruction or gangrene   . History of closed head injury 04/21/2018  . Kidney stones  has had one   . Lumbosacral spondylosis without myelopathy   . Lymphedema   . Meniere disease    Drs are uncertain of this DX   . Migraine headache   . Neuromuscular disorder ([place])   . Obesity   . Other and unspecified hyperlipidemia   . Pneumonia   . Pulmonary embolism ([place])   . Seizures ([place])    HX of --- from medications,2003  . Stricture and stenosis of esophagus   . Thyroid disease   . Ventral hernia    Past Surgical History:  Procedure Laterality  Date  . ABDOMINAL HYSTERECTOMY  1990  . APPENDECTOMY    . [place]   Removed  . FEMORAL VARUS OSTEOTOMY W/ ADDUCTOR RELEASE AND ILIAC CREST BONE GRAFT, PELVIC OSTEOTOMY    . FEMUR FRACTURE SURGERY Left 2003  . HERNIA REPAIR     with mesh, abdominal  . HIP FRACTURE SURGERY Left   . HUMERUS FRACTURE SURGERY Left   . LUMBAR FUSION  1995   Fusion L4 - L5  . PARTIAL COLECTOMY     Secondary to diverticulitis  . TENDON TRANSFER    . TUBAL LIGATION  1972  . ULNAR NERVE REPAIR Left      Current Meds  Medication Sig  . albuterol (PROVENTIL) (2.5 MG/3ML) 0.083% nebulizer solution Take 3 mLs (2.5 mg total) by nebulization every 6 (six) hours as needed for wheezing or shortness of breath.  Marland Kitchen albuterol (VENTOLIN HFA) 108 (90 Base) MCG/ACT inhaler Inhale 2 puffs into the lungs every 6 (six) hours as needed for wheezing or shortness of breath.  Marland Kitchen aspirin EC 81 MG tablet Take 81 mg by mouth daily.  . calcium-vitamin D (OSCAL WITH D) 500-200 MG-UNIT tablet Take 1 tablet by mouth daily with breakfast.  . esomeprazole (NEXIUM) 40 MG capsule Take 1 capsule (40 mg total) by mouth 2 (two) times daily before a meal.  . fluticasone (FLONASE) 50 MCG/ACT nasal spray Place 2 sprays into both nostrils daily.  Marland Kitchen HYDROcodone-acetaminophen (NORCO/VICODIN) 5-325 MG tablet Take 1 tablet by mouth daily as needed for moderate pain or severe pain.  Marland Kitchen loratadine (CLARITIN) 10 MG tablet Take 1 tablet (10 mg total) by mouth daily.  . meclizine (ANTIVERT) 25 MG tablet Take 1 tablet (25 mg total) by mouth every 8 (eight) hours as needed for dizziness.  . metoprolol tartrate (LOPRESSOR) 50 MG tablet TAKE 1/2 TABLET BY MOUTH DAILY  . Multiple Vitamins-Minerals (WOMENS 50+ ADVANCED PO) Take by mouth.  . oxybutynin (DITROPAN-XL) 10 MG 24 hr tablet TAKE 1 TABLET BY MOUTH AT BEDTIME  . pravastatin (PRAVACHOL) 20 MG tablet TAKE 1 TABLET BY MOUTH EVERY DAY  . SYNTHROID 100 MCG tablet One whole tablet by mouth on MON and  FRI and 1/2 tab all other days  . triamterene-hydrochlorothiazide (MAXZIDE-25) 37.5-25 MG tablet TAKE 1 TABLET BY MOUTH EVERY DAY  . Vitamin D, Ergocalciferol, (DRISDOL) 1.25 MG (50000 UNIT) CAPS capsule TAKE 1 CAPSULE BY MOUTH EVERY 7 DAYS     Allergies:   Ambien [zolpidem tartrate], Diovan [valsartan], Wellbutrin [bupropion], Codeine, Feldene [piroxicam], Guaifenesin er, Latex, Mobic [meloxicam], Neurontin [gabapentin], Crestor [rosuvastatin calcium], Lipitor [atorvastatin calcium], Livalo [pitavastatin], Simcor [niacin-simvastatin er], and Zocor [simvastatin]   Social History   Tobacco Use  . Smoking status: Never Smoker  . Smokeless tobacco: Never Used  Vaping Use  . Vaping Use: Never used  Substance Use Topics  . Alcohol use: Not Currently    Alcohol/week: 0.0 standard  drinks    Comment: wine once or twice a year  . Drug use: No     Family Hx: The patient's family history includes Alzheimer's disease in her father and mother; Congestive Heart Failure in her maternal grandfather; Depression in her paternal grandfather and son; Fibromyalgia in her daughter; GER disease in her son; Gout in her brother; Heart disease in her maternal grandfather and maternal grandmother; Hypertension in her father, mother, paternal grandmother, and son; Spondylitis in her son; Stroke in her mother and paternal grandmother; Suicidality in her paternal grandfather. There is no history of Colon cancer, Esophageal cancer, Pancreatic cancer, Stomach cancer, or Liver disease.  ROS:   Please see the history of present illness.     All other systems reviewed and are negative.   Prior CV studies:   The following studies were reviewed today:   Labs/Other Tests and Data Reviewed:    EKG:  n/a  Recent Labs: 03/31/2019: B Natriuretic Peptide 70.0 04/04/2019: Magnesium 1.9 05/03/2019: Hemoglobin 13.6; Platelets 178 06/04/2019: ALT 18 09/27/2019: TSH 2.450 12/09/2019: BUN 28; Creatinine, Ser 1.04; Potassium  3.8; Sodium 142   Recent Lipid Panel Lab Results  Component Value Date/Time   CHOL 183 05/03/2019 10:16 AM   CHOL 194 02/05/2013 08:52 AM   TRIG 125 05/03/2019 10:16 AM   TRIG 129 08/26/2016 10:52 AM   TRIG 120 02/05/2013 08:52 AM   HDL 50 05/03/2019 10:16 AM   HDL 47 08/26/2016 10:52 AM   HDL 44 02/05/2013 08:52 AM   CHOLHDL 3.7 05/03/2019 10:16 AM   LDLCALC 111 (H) 05/03/2019 10:16 AM   LDLCALC 128 (H) 09/13/2013 10:19 AM   LDLCALC 126 (H) 02/05/2013 08:52 AM    Wt Readings from Last 3 Encounters:  03/14/20 195 lb (88.5 kg)  12/09/19 195 lb 12.8 oz (88.8 kg)  09/20/19 195 lb 9.6 oz (88.7 kg)     Risk Assessment/Calculations:      Objective:    Vital Signs:  Ht 5' 1.5" (1.562 m)   Wt 195 lb (88.5 kg)   BMI 36.25 kg/m    Normal affect. Normal speech pattern and tone. Comfortable, no apparent distress. No audible signs of sob or wheezing.   ASSESSMENT & PLAN:     1. SOB/DOE -symptoms since COVID Dec 2020, has been on home O2 but starting to wean. Followed by pulmonary - no evidence of any contributing cardiac etiology, no further testing.   2. Labile heart rates - she had noted previously by her home monitor - by event monitor normal HRs, no arrhythmias - no recent symptoms, continue to monitor.    3. Lower extremity edema - combined etiology likely from diastolic dysfunction and suspect lymphedema based on appearance - much improved after lymphedema clinic, continuing home treatments - continue current therapy.      COVID-19 Education: The signs and symptoms of COVID-19 were discussed with the patient and how to seek care for testing (follow up with PCP or arrange E-visit).  The importance of social distancing was discussed today.  Time:   Today, I have spent 14 minutes with the patient with telehealth technology discussing the above problems.     Medication Adjustments/Labs and Tests Ordered: Current medicines are reviewed at length with the  patient today.  Concerns regarding medicines are outlined above.   Tests Ordered: No orders of the defined types were placed in this encounter.   Medication Changes: No orders of the defined types were placed in this encounter.   Follow  Up:  As needed  Signed, Carlyle Dolly, MD  03/14/2020 8:54 AM

## 2020-03-14 NOTE — Telephone Encounter (Signed)

## 2020-03-14 NOTE — Patient Instructions (Signed)
Your physician recommends that you schedule a follow-up appointment in: AS NEEDED   Your physician recommends that you continue on your current medications as directed. Please refer to the Current Medication list given to you today.  Thank you for choosing [facility]!!

## 2020-03-17 ENCOUNTER — Other Ambulatory Visit: Payer: Self-pay | Admitting: Family

## 2020-03-17 DIAGNOSIS — U071 COVID-19: Secondary | ICD-10-CM

## 2020-03-19 DIAGNOSIS — J1282 Pneumonia due to coronavirus disease 2019: Secondary | ICD-10-CM | POA: Diagnosis not present

## 2020-03-19 DIAGNOSIS — U071 COVID-19: Secondary | ICD-10-CM | POA: Diagnosis not present

## 2020-03-31 ENCOUNTER — Other Ambulatory Visit: Payer: Self-pay | Admitting: Family Medicine

## 2020-03-31 DIAGNOSIS — R600 Localized edema: Secondary | ICD-10-CM

## 2020-04-19 DIAGNOSIS — J1282 Pneumonia due to coronavirus disease 2019: Secondary | ICD-10-CM | POA: Diagnosis not present

## 2020-04-19 DIAGNOSIS — U071 COVID-19: Secondary | ICD-10-CM | POA: Diagnosis not present

## 2020-05-01 ENCOUNTER — Other Ambulatory Visit: Payer: Self-pay | Admitting: Family Medicine

## 2020-05-20 DIAGNOSIS — U071 COVID-19: Secondary | ICD-10-CM | POA: Diagnosis not present

## 2020-05-20 DIAGNOSIS — J1282 Pneumonia due to coronavirus disease 2019: Secondary | ICD-10-CM | POA: Diagnosis not present

## 2020-05-30 ENCOUNTER — Other Ambulatory Visit: Payer: Self-pay | Admitting: Family Medicine

## 2020-05-30 DIAGNOSIS — I10 Essential (primary) hypertension: Secondary | ICD-10-CM

## 2020-06-08 ENCOUNTER — Other Ambulatory Visit: Payer: Self-pay | Admitting: Family Medicine

## 2020-06-09 ENCOUNTER — Ambulatory Visit (INDEPENDENT_AMBULATORY_CARE_PROVIDER_SITE_OTHER): Payer: Medicare Other | Admitting: Family Medicine

## 2020-06-09 ENCOUNTER — Telehealth: Payer: Self-pay | Admitting: *Deleted

## 2020-06-09 ENCOUNTER — Other Ambulatory Visit: Payer: Self-pay

## 2020-06-09 ENCOUNTER — Encounter: Payer: Self-pay | Admitting: Family Medicine

## 2020-06-09 ENCOUNTER — Telehealth: Payer: Self-pay

## 2020-06-09 VITALS — BP 102/62 | HR 66 | Temp 96.9°F | Ht 61.0 in | Wt 200.0 lb

## 2020-06-09 DIAGNOSIS — R600 Localized edema: Secondary | ICD-10-CM | POA: Diagnosis not present

## 2020-06-09 DIAGNOSIS — G8921 Chronic pain due to trauma: Secondary | ICD-10-CM | POA: Diagnosis not present

## 2020-06-09 DIAGNOSIS — I129 Hypertensive chronic kidney disease with stage 1 through stage 4 chronic kidney disease, or unspecified chronic kidney disease: Secondary | ICD-10-CM

## 2020-06-09 DIAGNOSIS — L309 Dermatitis, unspecified: Secondary | ICD-10-CM | POA: Diagnosis not present

## 2020-06-09 DIAGNOSIS — R42 Dizziness and giddiness: Secondary | ICD-10-CM | POA: Diagnosis not present

## 2020-06-09 DIAGNOSIS — L819 Disorder of pigmentation, unspecified: Secondary | ICD-10-CM

## 2020-06-09 DIAGNOSIS — R8290 Unspecified abnormal findings in urine: Secondary | ICD-10-CM

## 2020-06-09 DIAGNOSIS — N1831 Chronic kidney disease, stage 3a: Secondary | ICD-10-CM

## 2020-06-09 DIAGNOSIS — E034 Atrophy of thyroid (acquired): Secondary | ICD-10-CM | POA: Diagnosis not present

## 2020-06-09 LAB — URINALYSIS, COMPLETE
Bilirubin, UA: NEGATIVE
Glucose, UA: NEGATIVE
Ketones, UA: NEGATIVE
Leukocytes,UA: NEGATIVE
Nitrite, UA: NEGATIVE
Protein,UA: NEGATIVE
RBC, UA: NEGATIVE
Specific Gravity, UA: 1.015 (ref 1.005–1.030)
Urobilinogen, Ur: 0.2 mg/dL (ref 0.2–1.0)
pH, UA: 7 (ref 5.0–7.5)

## 2020-06-09 LAB — MICROSCOPIC EXAMINATION
Epithelial Cells (non renal): NONE SEEN /hpf (ref 0–10)
RBC, Urine: NONE SEEN /hpf (ref 0–2)
WBC, UA: NONE SEEN /hpf (ref 0–5)

## 2020-06-09 MED ORDER — HYDROCODONE-ACETAMINOPHEN 5-325 MG PO TABS: 1.0000 | ORAL_TABLET | Freq: Every day | ORAL | 0 refills | Status: DC | PRN

## 2020-06-09 MED ORDER — TRIAMTERENE-HCTZ 37.5-25 MG PO TABS: 0.5000 | ORAL_TABLET | Freq: Every day | ORAL | 0 refills | Status: DC

## 2020-06-09 MED ORDER — EUCRISA 2 % EX OINT
TOPICAL_OINTMENT | CUTANEOUS | 99 refills | Status: DC
Start: 2020-06-09 — End: 2020-06-09

## 2020-06-09 MED ORDER — PREDNISONE 20 MG PO TABS: 40.0000 mg | ORAL_TABLET | Freq: Every day | ORAL | 0 refills | Status: AC

## 2020-06-09 MED ORDER — EUCRISA 2 % EX OINT: TOPICAL_OINTMENT | CUTANEOUS | 99 refills | Status: DC

## 2020-06-09 NOTE — Telephone Encounter (Signed)
She cannot apply these agents to her face.  Corticosteroids are not recommended for face, axilla, groin regions.

## 2020-06-09 NOTE — Telephone Encounter (Signed)
Rx resent.  Thanks for that info

## 2020-06-09 NOTE — Telephone Encounter (Signed)
HYDROcodone-Acetaminophen 5-325MG  tablets PA came in [place]: [place] to plan

## 2020-06-09 NOTE — Addendum Note (Signed)
Addended by: Janora Norlander on: 06/09/2020 01:04 PM   Modules accepted: Orders

## 2020-06-09 NOTE — Telephone Encounter (Signed)
Approved today Request Reference Number: [id number]. HYDROCO/APAP TAB 5-325MG  is approved through 07/09/2020. Your patient may now fill this prescription and it will be covered.  [facility] aware

## 2020-06-09 NOTE — Progress Notes (Signed)
Subjective: CC: hypothyroidism PCP: Janora Norlander, DO Selena Hunt is a 70 y.o. female presenting to clinic today for:  1. Hypothyroidism Compliant with thyroid medication.  Reports of tremor, change in bowel habit  2.  Dizziness/vertigo Patient has had a couple of episodes of vertigo and she used a couple of days of prednisone as needed for this.  This did seem to resolve.  She is asking for renewal on this.  3.  Facial dermatitis Patient has been using Eucrisa applied to the upper lip.  She occasionally gets dermatitis in this area and is asking for more samples or prescription for this.  4.  Skin lesion of forearm Patient reports a skin lesion of her left forearm that is new.  It has irregular color.  No skin breakdown or bleeding  5.  Urinary odor Patient has recurrent urinary odor.  She is been seen by urology in the past but it has been greater than 1 year.  She was previously treated with Septra and advised to return if symptoms do not resolve.  She would like to get a urinalysis with urine culture.  Denies any blood in urine, no increased frequency or dysuria.  6.  Hypertension with CKD 3 Patient is compliant with triamterene-hydrochlorothiazide.  She has had some intermittent dizziness and vertigo as above.  No chest pain reported  7.  Chronic pain Patient continues to use Norco intermittently for chronic pain.  She had a flareup of her right hip when she was exercising over the last few days and in fact used 1 tablet yesterday as a result.  She continues to use this extremely sparingly.  Denies any falls, respiratory depression, visual or auditory hallucinations.   ROS: Per HPI  Allergies  Allergen Reactions  . Ambien [Zolpidem Tartrate] Anxiety  . Diovan [Valsartan] Other (See Comments)    Seizures.  . Wellbutrin [Bupropion] Other (See Comments)    Seizures.  . Codeine     Feel crazy and dizzy   . Feldene [Piroxicam]     Swelling   . Guaifenesin Er      Raise Bp / HR   . Latex     Swelling   . Mobic [Meloxicam] Other (See Comments)    Stomach cramps  . Neurontin [Gabapentin]     "loopy"  . Crestor [Rosuvastatin Calcium] Other (See Comments)    Myalgias   . Lipitor [Atorvastatin Calcium] Other (See Comments)    myalgias  . Livalo [Pitavastatin] Swelling and Other (See Comments)    stomach cramping   . Simcor [Niacin-Simvastatin Er] Other (See Comments)    Stomach cramps  . Zocor [Simvastatin] Other (See Comments)    Stomach cramps   Past Medical History:  Diagnosis Date  . Benign neoplasm of colon   . Blood transfusion without reported diagnosis    x 34 to date  . Chronic kidney disease   . Colon polyps   . Diverticulitis of colon (without mention of hemorrhage)(562.11)   . Diverticulosis of colon (without mention of hemorrhage)   . Essential hypertension, benign   . Female stress incontinence   . GERD (gastroesophageal reflux disease)   . Hematuria   . Hernia of unspecified site of abdominal cavity without mention of obstruction or gangrene   . History of closed head injury 04/21/2018  . Kidney stones    has had one   . Lumbosacral spondylosis without myelopathy   . Lymphedema   . Meniere disease    Drs  are uncertain of this DX   . Migraine headache   . Neuromuscular disorder ([place])   . Obesity   . Other and unspecified hyperlipidemia   . Pneumonia   . Pulmonary embolism ([place])   . Seizures ([place])    HX of --- from medications,2003  . Stricture and stenosis of esophagus   . Thyroid disease   . Ventral hernia     Current Outpatient Medications:  .  albuterol (PROVENTIL) (2.5 MG/3ML) 0.083% nebulizer solution, Take 3 mLs (2.5 mg total) by nebulization every 6 (six) hours as needed for wheezing or shortness of breath., Disp: 150 mL, Rfl: 1 .  albuterol (VENTOLIN HFA) 108 (90 Base) MCG/ACT inhaler, Inhale 2 puffs into the lungs every 6 (six) hours as needed for wheezing or shortness of breath., Disp: 8 g, Rfl:  0 .  aspirin EC 81 MG tablet, Take 81 mg by mouth daily., Disp: , Rfl:  .  calcium-vitamin D (OSCAL WITH D) 500-200 MG-UNIT tablet, Take 1 tablet by mouth daily with breakfast., Disp: 90 tablet, Rfl: 1 .  esomeprazole (NEXIUM) 40 MG capsule, Take 1 capsule (40 mg total) by mouth 2 (two) times daily before a meal., Disp: 60 capsule, Rfl: 3 .  fluticasone (FLONASE) 50 MCG/ACT nasal spray, Place 2 sprays into both nostrils daily., Disp: 16 g, Rfl: 6 .  HYDROcodone-acetaminophen (NORCO/VICODIN) 5-325 MG tablet, Take 1 tablet by mouth daily as needed for moderate pain or severe pain., Disp: 30 tablet, Rfl: 0 .  levothyroxine (SYNTHROID) 100 MCG tablet, TAKE ONE WHOLE TABLET ON MONDAY AND FRIDAY, AND TAKE 1/2 TABLET ALL OTHER DAYS, Disp: 60 tablet, Rfl: 1 .  loratadine (CLARITIN) 10 MG tablet, Take 1 tablet (10 mg total) by mouth daily., Disp: 30 tablet, Rfl: 11 .  meclizine (ANTIVERT) 25 MG tablet, Take 1 tablet (25 mg total) by mouth every 8 (eight) hours as needed for dizziness., Disp: 30 tablet, Rfl: 2 .  metoprolol tartrate (LOPRESSOR) 50 MG tablet, TAKE 1/2 TABLET BY MOUTH DAILY, Disp: 45 tablet, Rfl: 0 .  Multiple Vitamins-Minerals (WOMENS 50+ ADVANCED PO), Take by mouth., Disp: , Rfl:  .  oxybutynin (DITROPAN-XL) 10 MG 24 hr tablet, TAKE 1 TABLET BY MOUTH AT BEDTIME, Disp: 30 tablet, Rfl: 1 .  pravastatin (PRAVACHOL) 20 MG tablet, TAKE 1 TABLET BY MOUTH EVERY DAY, Disp: 90 tablet, Rfl: 0 .  triamterene-hydrochlorothiazide (MAXZIDE-25) 37.5-25 MG tablet, TAKE 1 TABLET BY MOUTH EVERY DAY, Disp: 90 tablet, Rfl: 0 .  Vitamin D, Ergocalciferol, (DRISDOL) 1.25 MG (50000 UNIT) CAPS capsule, TAKE 1 CAPSULE BY MOUTH EVERY 7 DAYS, Disp: 12 capsule, Rfl: 3 Social History   Socioeconomic History  . Marital status: Widowed    Spouse name: Christianna Belmonte  . Number of children: 2  . Years of education: Not on file  . Highest education level: 12th grade  Occupational History  . 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 Smoking status: Never Smoker  . Smokeless tobacco: Never Used  Vaping Use  . Vaping Use: Never used  Substance and Sexual Activity  . Alcohol use: Not Currently    Alcohol/week: 0.0 standard drinks    Comment: wine once or twice a year  . Drug use: No  . Sexual activity: Never    Birth control/protection: Post-menopausal  Other Topics Concern  . Not on file  Social History Narrative   Lives alone.    Caffeine 2  cups daily    Right handed   Social Determinants of Health   Financial Resource Strain: Low Risk   . Difficulty of Paying Living Expenses: Not hard at all  Food Insecurity: No Food Insecurity  . Worried About Charity fundraiser in the Last Year: Never true  . Ran Out of Food in the Last Year: Never true  Transportation Needs: No Transportation Needs  . Lack of Transportation (Medical): No  . Lack of Transportation (Non-Medical): No  Physical Activity: Not on file  Stress: Not on file  Social Connections: Moderately Integrated  . Frequency of Communication with Friends and Family: More than three times a week  . Frequency of Social Gatherings with Friends and Family: More than three times a week  . Attends Religious Services: More than 4 times per year  . Active Member of Clubs or Organizations: Yes  . Attends Archivist Meetings: More than 4 times per year  . Marital Status: Widowed  Intimate Partner Violence: Not on file   Family History  Problem Relation Age of Onset  . Alzheimer's disease Mother   . Hypertension Mother   . Stroke Mother   . Alzheimer's disease Father   . Hypertension Father   . Gout Brother   . Fibromyalgia Daughter   . Hypertension Son   . Depression Son   . Spondylitis Son        spondylosis  . GER disease Son   . Heart disease Maternal Grandmother   . Heart disease Maternal Grandfather   . Congestive Heart Failure Maternal Grandfather   . Stroke Paternal  Grandmother   . Hypertension Paternal Grandmother   . Depression Paternal Grandfather   . Suicidality Paternal Grandfather   . Colon cancer Neg Hx   . Esophageal cancer Neg Hx   . Pancreatic cancer Neg Hx   . Stomach cancer Neg Hx   . Liver disease Neg Hx     Objective: Office vital signs reviewed. BP 102/62   Pulse 66   Temp (!) 96.9 F (36.1 C) (Temporal)   Ht 5\' 1"  (1.549 m)   Wt 200 lb (90.7 kg)   SpO2 97%   BMI 37.79 kg/m   Physical Examination:  General: Awake, alert, well nourished, No acute distress HEENT: Normal; sclera white; assistive device noted along the right side of head Cardio: regular rate and rhythm, S1S2 heard, no murmurs appreciated Pulm: clear to auscultation bilaterally, no wheezes, rhonchi or rales; normal work of breathing on room air Extremities: warm, well perfused, No edema, cyanosis or clubbing; +2 pulses bilaterally MSK: Ambulating independently.  Normal gait and station Skin: Left forearm with an irregularly shaped, irregularly pigmented lesion that is about 2-1/2 mm x 1 and half millimeters in size.  No appreciable hypervascularity or umbilication. Face with scarring.  No active dermatitis appreciated  Assessment/ Plan: 70 y.o. 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  Check thyroid panel  Blood pressures well controlled.  In fact I think that she may be borderline hypotensive and advised her to reduce her triamterene to 1/2 tablet daily.  If blood pressures remain below 110 over 70s, advised to discontinue use and monitor blood pressures closely.  She voiced good understanding of this plan  Prednisone given for vertigo.  She has history of surgical intervention for this in the past  Given the atopic dermatitis of the face I agree with [place].  A prescription has been sent.  Do not feel that corticosteroids would be appropriate in her situation given that the face is affected.  Urinalysis with very few bacteria but nitrite negative and white blood cell count negative.  Unlikely to be an infection but will send for culture.  Follow-up with urology  Chronic pain is stable with very infrequent flares.  National narcotic database was reviewed and there were no red flags.  She is technically due for tox this year in June but because she was leaving a urine sample anyways urine tox was obtained today.  We will update CSC at next visit.  Concerned about her pigmented skin lesion.  I am placing an urgent referral to dermatology.  I think that she likely needs this excised given irregularly shaped borders and irregular pigmentation.  Has seen Tafeen in the past but if the weight for him is quite long we will plan to have her see someone else    No orders of the defined types were placed in this encounter.  No orders of the defined types were placed in this encounter.    Janora Norlander, DO [place] [phone]

## 2020-06-09 NOTE — Telephone Encounter (Signed)
Eucrisa 2 % ointment PA came in   Clobetasol Propionate    NOT Required* Mometasone Furoate   NOT Required* Halobetasol Propionate NOT Required* Triamcinolone Acetonide NOT Required* Fluocinolone Acetonide NOT Required* Betamethasone Valerate NOT Required* Betamethasone Dipropionate NOT Required*  Can this med be changed to one of the above covered by her plan. Can attempt PA if needed.

## 2020-06-09 NOTE — Telephone Encounter (Signed)
Eucrisa they need to know approximately the amount of grams that they need to apply daily example like a pea size. Or how many days supply the 60 grams should last the patient. This is a new requirement from insurance.

## 2020-06-10 LAB — VITAMIN D 25 HYDROXY (VIT D DEFICIENCY, FRACTURES): Vit D, 25-Hydroxy: 90.8 ng/mL (ref 30.0–100.0)

## 2020-06-10 LAB — CBC
Hematocrit: 43.4 % (ref 34.0–46.6)
Hemoglobin: 14.6 g/dL (ref 11.1–15.9)
MCH: 28.4 pg (ref 26.6–33.0)
MCHC: 33.6 g/dL (ref 31.5–35.7)
MCV: 84 fL (ref 79–97)
Platelets: 198 10*3/uL (ref 150–450)
RBC: 5.14 x10E6/uL (ref 3.77–5.28)
RDW: 13.8 % (ref 11.7–15.4)
WBC: 5.8 10*3/uL (ref 3.4–10.8)

## 2020-06-10 LAB — RENAL FUNCTION PANEL
Albumin: 4.5 g/dL (ref 3.7–4.7)
BUN/Creatinine Ratio: 23 (ref 12–28)
BUN: 24 mg/dL (ref 8–27)
CO2: 23 mmol/L (ref 20–29)
Calcium: 9.9 mg/dL (ref 8.7–10.3)
Chloride: 103 mmol/L (ref 96–106)
Creatinine, Ser: 1.03 mg/dL — ABNORMAL HIGH (ref 0.57–1.00)
Glucose: 92 mg/dL (ref 65–99)
Phosphorus: 3 mg/dL (ref 3.0–4.3)
Potassium: 3.9 mmol/L (ref 3.5–5.2)
Sodium: 143 mmol/L (ref 134–144)
eGFR: 58 mL/min/{1.73_m2} — ABNORMAL LOW (ref >59)

## 2020-06-10 LAB — THYROID PANEL WITH TSH
Free Thyroxine Index: 1.9 (ref 1.2–4.9)
T3 Uptake Ratio: 25 % (ref 24–39)
T4, Total: 7.6 ug/dL (ref 4.5–12.0)
TSH: 3.25 u[IU]/mL (ref 0.450–4.500)

## 2020-06-11 LAB — URINE CULTURE: Organism ID, Bacteria: NO GROWTH

## 2020-06-13 NOTE — Telephone Encounter (Signed)
PA restarted for Eucrisa 2% ointment  Key: [id number] For eczema of face  Sent to plan

## 2020-06-13 NOTE — Telephone Encounter (Signed)
Please inform pt.  If we have any more samples that come through, please hold for patient.

## 2020-06-13 NOTE — Telephone Encounter (Signed)
Deniedtoday Request Reference Number: [id number]. EUCRISA OIN 2% is denied for not meeting the prior authorization requirement  Denied - with FACE notes

## 2020-06-17 DIAGNOSIS — J1282 Pneumonia due to coronavirus disease 2019: Secondary | ICD-10-CM | POA: Diagnosis not present

## 2020-06-17 DIAGNOSIS — U071 COVID-19: Secondary | ICD-10-CM | POA: Diagnosis not present

## 2020-06-21 ENCOUNTER — Telehealth: Payer: Self-pay | Admitting: Dermatology

## 2020-06-21 NOTE — Telephone Encounter (Signed)
Referral attached to appointment

## 2020-06-21 NOTE — Telephone Encounter (Signed)
Patient is calling for a referral appointment from [facility], DO.  Patient is scheduled for 09/05/2020 at 9:00 with Lavonna Monarch, MD.

## 2020-06-28 ENCOUNTER — Other Ambulatory Visit: Payer: Self-pay | Admitting: Family Medicine

## 2020-07-18 DIAGNOSIS — J1282 Pneumonia due to coronavirus disease 2019: Secondary | ICD-10-CM | POA: Diagnosis not present

## 2020-07-18 DIAGNOSIS — U071 COVID-19: Secondary | ICD-10-CM | POA: Diagnosis not present

## 2020-07-28 ENCOUNTER — Ambulatory Visit (INDEPENDENT_AMBULATORY_CARE_PROVIDER_SITE_OTHER): Payer: Medicare Other

## 2020-07-28 VITALS — Ht 61.0 in | Wt 194.0 lb

## 2020-07-28 DIAGNOSIS — Z0000 Encounter for general adult medical examination without abnormal findings: Secondary | ICD-10-CM | POA: Diagnosis not present

## 2020-07-28 NOTE — Patient Instructions (Signed)
Selena Hunt , Thank you for taking time to come for your Medicare Wellness Visit. I appreciate your ongoing commitment to your health goals. Please review the following plan we discussed and let me know if I can assist you in the future.   Screening recommendations/referrals: Colonoscopy: Done 02/04/2017 - Repeat 5 years (2023) Mammogram: Done 11/04/2019 - Repeat annually at [place]: Done 01/03/2017 - Repeat every 2 years (okay to do at next visit) Recommended yearly ophthalmology/optometry visit for glaucoma screening and checkup Recommended yearly dental visit for hygiene and checkup  Vaccinations: Influenza vaccine: Due in Fall Pneumococcal vaccine: Done 01/14/2014 & 03/06/2015 Tdap vaccine: Done 01/07/2011 - Repeat 10 years (2022) Shingles vaccine: Shingrix discussed. Please contact your pharmacy for coverage information.    Covid-19: Done 07/09/19, 08/10/19, & 04/21/20  Advanced directives: Please bring a copy of your health care power of attorney and living will to the office to be added to your chart at your convenience.  Conditions/risks identified: Increase physical activity as tolerated. Aim for 30 minutes of exercise or brisk walking each day, drink 6-8 glasses of water and eat lots of fruits and vegetables.  Next appointment: Follow up in one year for your annual wellness visit    Preventive Care 65 Years and Older, Female Preventive care refers to lifestyle choices and visits with your health care provider that can promote health and wellness. What does preventive care include?  A yearly physical exam. This is also called an annual well check.  Dental exams once or twice a year.  Routine eye exams. Ask your health care provider how often you should have your eyes checked.  Personal lifestyle choices, including:  Daily care of your teeth and gums.  Regular physical activity.  Eating a healthy diet.  Avoiding tobacco and drug use.  Limiting alcohol  use.  Practicing safe sex.  Taking low-dose aspirin every day.  Taking vitamin and mineral supplements as recommended by your health care provider. What happens during an annual well check? The services and screenings done by your health care provider during your annual well check will depend on your age, overall health, lifestyle risk factors, and family history of disease. Counseling  Your health care provider may ask you questions about your:  Alcohol use.  Tobacco use.  Drug use.  Emotional well-being.  Home and relationship well-being.  Sexual activity.  Eating habits.  History of falls.  Memory and ability to understand (cognition).  Work and work Statistician.  Reproductive health. Screening  You may have the following tests or measurements:  Height, weight, and BMI.  Blood pressure.  Lipid and cholesterol levels. These may be checked every 5 years, or more frequently if you are over 63 years old.  Skin check.  Lung cancer screening. You may have this screening every year starting at age 56 if you have a 30-pack-year history of smoking and currently smoke or have quit within the past 15 years.  Fecal occult blood test (FOBT) of the stool. You may have this test every year starting at age 55.  Flexible sigmoidoscopy or colonoscopy. You may have a sigmoidoscopy every 5 years or a colonoscopy every 10 years starting at age 77.  Hepatitis C blood test.  Hepatitis B blood test.  Sexually transmitted disease (STD) testing.  Diabetes screening. This is done by checking your blood sugar (glucose) after you have not eaten for a while (fasting). You may have this done every 1-3 years.  Bone density scan. This is  done to screen for osteoporosis. You may have this done starting at age 84.  Mammogram. This may be done every 1-2 years. Talk to your health care provider about how often you should have regular mammograms. Talk with your health care provider about  your test results, treatment options, and if necessary, the need for more tests. Vaccines  Your health care provider may recommend certain vaccines, such as:  Influenza vaccine. This is recommended every year.  Tetanus, diphtheria, and acellular pertussis (Tdap, Td) vaccine. You may need a Td booster every 10 years.  Zoster vaccine. You may need this after age 57.  Pneumococcal 13-valent conjugate (PCV13) vaccine. One dose is recommended after age 31.  Pneumococcal polysaccharide (PPSV23) vaccine. One dose is recommended after age 90. Talk to your health care provider about which screenings and vaccines you need and how often you need them. This information is not intended to replace advice given to you by your health care provider. Make sure you discuss any questions you have with your health care provider. Document Released: 04/21/2015 Document Revised: 12/13/2015 Document Reviewed: 01/24/2015 Elsevier Interactive Patient Education  2017 [employer] Prevention in the Home Falls can cause injuries. They can happen to people of all ages. There are many things you can do to make your home safe and to help prevent falls. What can I do on the outside of my home?  Regularly fix the edges of walkways and driveways and fix any cracks.  Remove anything that might make you trip as you walk through a door, such as a raised step or threshold.  Trim any bushes or trees on the path to your home.  Use bright outdoor lighting.  Clear any walking paths of anything that might make someone trip, such as rocks or tools.  Regularly check to see if handrails are loose or broken. Make sure that both sides of any steps have handrails.  Any raised decks and porches should have guardrails on the edges.  Have any leaves, snow, or ice cleared regularly.  Use sand or salt on walking paths during winter.  Clean up any spills in your garage right away. This includes oil or grease spills. What  can I do in the bathroom?  Use night lights.  Install grab bars by the toilet and in the tub and shower. Do not use towel bars as grab bars.  Use non-skid mats or decals in the tub or shower.  If you need to sit down in the shower, use a plastic, non-slip stool.  Keep the floor dry. Clean up any water that spills on the floor as soon as it happens.  Remove soap buildup in the tub or shower regularly.  Attach bath mats securely with double-sided non-slip rug tape.  Do not have throw rugs and other things on the floor that can make you trip. What can I do in the bedroom?  Use night lights.  Make sure that you have a light by your bed that is easy to reach.  Do not use any sheets or blankets that are too big for your bed. They should not hang down onto the floor.  Have a firm chair that has side arms. You can use this for support while you get dressed.  Do not have throw rugs and other things on the floor that can make you trip. What can I do in the kitchen?  Clean up any spills right away.  Avoid walking on wet floors.  Keep  items that you use a lot in easy-to-reach places.  If you need to reach something above you, use a strong step stool that has a grab bar.  Keep electrical cords out of the way.  Do not use floor polish or wax that makes floors slippery. If you must use wax, use non-skid floor wax.  Do not have throw rugs and other things on the floor that can make you trip. What can I do with my stairs?  Do not leave any items on the stairs.  Make sure that there are handrails on both sides of the stairs and use them. Fix handrails that are broken or loose. Make sure that handrails are as long as the stairways.  Check any carpeting to make sure that it is firmly attached to the stairs. Fix any carpet that is loose or worn.  Avoid having throw rugs at the top or bottom of the stairs. If you do have throw rugs, attach them to the floor with carpet tape.  Make sure  that you have a light switch at the top of the stairs and the bottom of the stairs. If you do not have them, ask someone to add them for you. What else can I do to help prevent falls?  Wear shoes that:  Do not have high heels.  Have rubber bottoms.  Are comfortable and fit you well.  Are closed at the toe. Do not wear sandals.  If you use a stepladder:  Make sure that it is fully opened. Do not climb a closed stepladder.  Make sure that both sides of the stepladder are locked into place.  Ask someone to hold it for you, if possible.  Clearly mark and make sure that you can see:  Any grab bars or handrails.  First and last steps.  Where the edge of each step is.  Use tools that help you move around (mobility aids) if they are needed. These include:  Canes.  Walkers.  Scooters.  Crutches.  Turn on the lights when you go into a dark area. Replace any light bulbs as soon as they burn out.  Set up your furniture so you have a clear path. Avoid moving your furniture around.  If any of your floors are uneven, fix them.  If there are any pets around you, be aware of where they are.  Review your medicines with your doctor. Some medicines can make you feel dizzy. This can increase your chance of falling. Ask your doctor what other things that you can do to help prevent falls. This information is not intended to replace advice given to you by your health care provider. Make sure you discuss any questions you have with your health care provider. Document Released: 01/19/2009 Document Revised: 08/31/2015 Document Reviewed: 04/29/2014 Elsevier Interactive Patient Education  2017 [employer].

## 2020-07-28 NOTE — Progress Notes (Signed)
Subjective:   ELIYANNA FOLAN is a 70 y.o. female who presents for Medicare Annual (Subsequent) preventive examination.  Virtual Visit via Telephone Note  I connected with  [name] on 07/28/20 at  3:30 PM EDT by telephone and verified that I am speaking with the correct person using two identifiers.  Location: Patient: Home Provider: [facility] Persons participating in the virtual visit: patient/Nurse Health Advisor   I discussed the limitations, risks, security and privacy concerns of performing an evaluation and management service by telephone and the availability of in person appointments. The patient expressed understanding and agreed to proceed.  Interactive audio and video telecommunications were attempted between this nurse and patient, however failed, due to patient having technical difficulties OR patient did not have access to video capability.  We continued and completed visit with audio only.  Some vital signs may be absent or patient reported.   Lillymae Duet E Ludivina Guymon, LPN   Review of Systems     Cardiac Risk Factors include: advanced age (>69men, >63 women);dyslipidemia;hypertension;sedentary lifestyle;obesity (BMI >30kg/m2)     Objective:    Today's Vitals   07/28/20 1419  Weight: 194 lb (88 kg)  Height: 5\' 1"  (1.549 m)   Body mass index is 36.66 kg/m.  Advanced Directives 07/28/2020 10/05/2019 05/31/2019 03/31/2019 03/31/2019 03/31/2019 08/04/2017  Does Patient Have a Medical Advance Directive? Yes No Yes Yes Yes No Yes  Type of Paramedic of [place];Living will - [place];Living will;Out of facility DNR (pink MOST or yellow form) Living will Living will - [place];Living will  Does patient want to make changes to medical advance directive? - - - No - Patient declined - - -  Copy of [place] in Chart? No - copy requested - - - - - No - copy requested  Would patient like information on  creating a medical advance directive? - No - Patient declined - - - - -  Pre-existing out of facility DNR order (yellow form or pink MOST form) - - - - - - -    Current Medications (verified) Outpatient Encounter Medications as of 07/28/2020  Medication Sig  . albuterol (PROVENTIL) (2.5 MG/3ML) 0.083% nebulizer solution Take 3 mLs (2.5 mg total) by nebulization every 6 (six) hours as needed for wheezing or shortness of breath.  Marland Kitchen albuterol (VENTOLIN HFA) 108 (90 Base) MCG/ACT inhaler Inhale 2 puffs into the lungs every 6 (six) hours as needed for wheezing or shortness of breath.  Marland Kitchen aspirin EC 81 MG tablet Take 81 mg by mouth daily.  . benzonatate (TESSALON) 200 MG capsule Take 200 mg by mouth 3 (three) times daily as needed.  . calcium-vitamin D (OSCAL WITH D) 500-200 MG-UNIT tablet Take 1 tablet by mouth daily with breakfast.  . Crisaborole (EUCRISA) 2 % OINT Apply pea sized amount to affected areas on face once daily.  Marland Kitchen esomeprazole (NEXIUM) 40 MG capsule Take 1 capsule (40 mg total) by mouth 2 (two) times daily before a meal.  . fluticasone (FLONASE) 50 MCG/ACT nasal spray Place 2 sprays into both nostrils daily.  Marland Kitchen HYDROcodone-acetaminophen (NORCO/VICODIN) 5-325 MG tablet Take 1 tablet by mouth daily as needed for moderate pain or severe pain.  Marland Kitchen levothyroxine (SYNTHROID) 100 MCG tablet TAKE ONE WHOLE TABLET ON MONDAY AND FRIDAY, AND TAKE 1/2 TABLET ALL OTHER DAYS  . loratadine (CLARITIN) 10 MG tablet Take 1 tablet (10 mg total) by mouth daily.  . meclizine (ANTIVERT) 25  MG tablet Take 1 tablet (25 mg total) by mouth every 8 (eight) hours as needed for dizziness.  . metoprolol tartrate (LOPRESSOR) 50 MG tablet TAKE 1/2 TABLET BY MOUTH DAILY  . Multiple Vitamins-Minerals (WOMENS 50+ ADVANCED PO) Take by mouth.  . oxybutynin (DITROPAN-XL) 10 MG 24 hr tablet TAKE 1 TABLET BY MOUTH AT BEDTIME  . pravastatin (PRAVACHOL) 20 MG tablet TAKE 1 TABLET BY MOUTH EVERY DAY  .  triamterene-hydrochlorothiazide (MAXZIDE-25) 37.5-25 MG tablet Take 0.5 tablets by mouth daily.  . Vitamin D, Ergocalciferol, (DRISDOL) 1.25 MG (50000 UNIT) CAPS capsule TAKE 1 CAPSULE BY MOUTH EVERY 7 DAYS   No facility-administered encounter medications on file as of 07/28/2020.    Allergies (verified) Ambien [zolpidem tartrate], Diovan [valsartan], Wellbutrin [bupropion], Codeine, Feldene [piroxicam], Guaifenesin er, Latex, Mobic [meloxicam], Neurontin [gabapentin], Crestor [rosuvastatin calcium], Lipitor [atorvastatin calcium], Livalo [pitavastatin], Simcor [niacin-simvastatin er], and Zocor [simvastatin]   History: Past Medical History:  Diagnosis Date  . Benign neoplasm of colon   . Blood transfusion without reported diagnosis    x 34 to date  . Chronic kidney disease   . Colon polyps   . Diverticulitis of colon (without mention of hemorrhage)(562.11)   . Diverticulosis of colon (without mention of hemorrhage)   . Essential hypertension, benign   . Female stress incontinence   . GERD (gastroesophageal reflux disease)   . Hematuria   . Hernia of unspecified site of abdominal cavity without mention of obstruction or gangrene   . History of closed head injury 04/21/2018  . Kidney stones    has had one   . Lumbosacral spondylosis without myelopathy   . Lymphedema   . Meniere disease    Drs are uncertain of this DX   . Migraine headache   . Neuromuscular disorder ([place])   . Obesity   . Other and unspecified hyperlipidemia   . Pneumonia   . Pulmonary embolism ([place])   . Seizures ([place])    HX of --- from medications,2003  . Stricture and stenosis of esophagus   . Thyroid disease   . Ventral hernia    Past Surgical History:  Procedure Laterality Date  . ABDOMINAL HYSTERECTOMY  1990  . APPENDECTOMY    . [place]   Removed  . FEMORAL VARUS OSTEOTOMY W/ ADDUCTOR RELEASE AND ILIAC CREST BONE GRAFT, PELVIC OSTEOTOMY    . FEMUR FRACTURE SURGERY Left 2003  . HERNIA  REPAIR     with mesh, abdominal  . HIP FRACTURE SURGERY Left   . HUMERUS FRACTURE SURGERY Left   . LUMBAR FUSION  1995   Fusion L4 - L5  . PARTIAL COLECTOMY     Secondary to diverticulitis  . TENDON TRANSFER    . TUBAL LIGATION  1972  . ULNAR NERVE REPAIR Left    Family History  Problem Relation Age of Onset  . Alzheimer's disease Mother   . Hypertension Mother   . Stroke Mother   . Alzheimer's disease Father   . Hypertension Father   . Gout Brother   . Fibromyalgia Daughter   . Hypertension Son   . Depression Son   . Spondylitis Son        spondylosis  . GER disease Son   . Heart disease Maternal Grandmother   . Heart disease Maternal Grandfather   . Congestive Heart Failure Maternal Grandfather   . Stroke Paternal Grandmother   . Hypertension Paternal Grandmother   . Depression Paternal Grandfather   . Suicidality Paternal  Grandfather   . Colon cancer Neg Hx   . Esophageal cancer Neg Hx   . Pancreatic cancer Neg Hx   . Stomach cancer Neg Hx   . Liver disease Neg Hx    Social History   Socioeconomic History  . Marital status: Widowed    Spouse name: Rozelia Schlosser  . Number of children: 2  . Years of education: Not on file  . Highest education level: 12th grade  Occupational History  . 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 Smoking status: Never Smoker  . Smokeless tobacco: Never Used  Vaping Use  . Vaping Use: Never used  Substance and Sexual Activity  . Alcohol use: Not Currently    Alcohol/week: 0.0 standard drinks    Comment: wine once or twice a year  . Drug use: No  . Sexual activity: Never    Birth control/protection: Post-menopausal  Other Topics Concern  . Not on file  Social History Narrative   Lives alone.    Caffeine 2 cups daily    Right handed   Social Determinants of Health   Financial Resource Strain: Low Risk   . Difficulty of Paying Living Expenses: Not hard at all  Food  Insecurity: No Food Insecurity  . Worried About Charity fundraiser in the Last Year: Never true  . Ran Out of Food in the Last Year: Never true  Transportation Needs: No Transportation Needs  . Lack of Transportation (Medical): No  . Lack of Transportation (Non-Medical): No  Physical Activity: Insufficiently Active  . Days of Exercise per Week: 7 days  . Minutes of Exercise per Session: 10 min  Stress: No Stress Concern Present  . Feeling of Stress : Not at all  Social Connections: Socially Integrated  . Frequency of Communication with Friends and Family: Three times a week  . Frequency of Social Gatherings with Friends and Family: More than three times a week  . Attends Religious Services: More than 4 times per year  . Active Member of Clubs or Organizations: Yes  . Attends Archivist Meetings: Never  . Marital Status: Married    Tobacco Counseling Counseling given: Not Answered   Clinical Intake:  Pre-visit preparation completed: Yes  Pain : No/denies pain     BMI - recorded: 36.66 Nutritional Status: BMI > 30  Obese Nutritional Risks: None Diabetes: No  How often do you need to have someone help you when you read instructions, pamphlets, or other written materials from your doctor or pharmacy?: 1 - Never  Diabetic? No  Interpreter Needed?: No  Information entered by :: Shrihaan Porzio, LPN   Activities of Daily Living In your present state of health, do you have any difficulty performing the following activities: 07/28/2020  Hearing? Y  Vision? N  Difficulty concentrating or making decisions? N  Walking or climbing stairs? Y  Comment a little SOB  Dressing or bathing? N  Doing errands, shopping? N  Preparing Food and eating ? N  Using the Toilet? N  In the past six months, have you accidently leaked urine? Y  Comment wears pads for protection  Do you have problems with loss of bowel control? N  Managing your Medications? N  Managing your Finances?  N  Housekeeping or managing your Housekeeping? N  Some recent data might be hidden    Patient Care Team: Janora Norlander, DO as PCP - General (  Family Medicine) Branch, Alphonse Guild, MD as PCP - Cardiology (Cardiology) Madelin Headings, DO (Optometry) Netta Cedars, MD as Consulting Physician (Orthopedic Surgery) Minus Breeding, MD as Consulting Physician (Cardiology) Thornell Sartorius, MD as Consulting Physician (Otolaryngology) Sherilyn Banker Bevely Palmer, MD as Referring Physician Richard Miu, DMD (Dentistry) Vicie Mutters, MD as Consulting Physician (Otolaryngology) Ladene Artist, MD as Consulting Physician (Gastroenterology)  Indicate any recent Medical Services you may have received from other than [facility] providers in the past year (date may be approximate).     Assessment:   This is a routine wellness examination for [employer].  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  Dietary issues and exercise activities discussed: Current Exercise Habits: Home exercise routine, Type of exercise: walking, Time (Minutes): 10, Frequency (Times/Week): 7, Weekly Exercise (Minutes/Week): 70, Intensity: Mild, Exercise limited by: respiratory conditions(s)  Goals    . AWV     05/31/2019 AWV Goal: Exercise for General Health   Patient will verbalize understanding of the benefits of increased physical activity:  Exercising regularly is important. It will improve your overall fitness, flexibility, and endurance.  Regular exercise also will improve your overall health. It can help you control your weight, reduce stress, and improve your bone density.  Over the  next year, patient will increase physical activity as tolerated with a goal of at least 150 minutes of moderate physical activity per week.   You can tell that you are exercising at a moderate intensity if your heart starts beating faster and you start breathing faster but can still hold a conversation.  Moderate-intensity exercise ideas include:  Walking 1 mile (1.6 km) in about 15 minutes  Biking  Hiking  Golfing  Dancing  Water aerobics  Patient will verbalize understanding of everyday activities that increase physical activity by providing examples like the following: ? Yard work, such as: ? Pushing a Conservation officer, nature ? Raking and bagging leaves ? Washing your car ? Pushing a stroller ? Shoveling snow ? Gardening ? Washing windows or floors  Patient will be able to explain general safety guidelines for exercising:   Before you start a new exercise program, talk with your health care provider.  Do not exercise so much that you hurt yourself, feel dizzy, or get very short of breath.  Wear comfortable clothes and wear shoes with good support.  Drink plenty of water while you exercise to prevent dehydration or heat stroke.  Work out until your breathing and your heartbeat get faster.     . Weight (lb) < 140 lb (63.5 kg)     Wants off of meds - HTN , lipids       Depression Screen PHQ 2/9 Scores 07/28/2020 06/09/2020 12/09/2019 09/08/2019 07/14/2019 06/04/2019 05/31/2019  PHQ - 2 Score 0 0 0 0 0 0 0  PHQ- 9 Score 0 0 - 0 - - -    Fall Risk Fall Risk  12/09/2019 09/08/2019 07/14/2019 06/04/2019 05/31/2019  Falls in the past year? 0 1 0 0 0  Number falls in past yr: - 0 - - -  Injury with Fall? - 0 - - -  Comment - - - - -  Risk for fall due to : - History of fall(s) - - -  Follow up - Falls prevention discussed - - -    FALL RISK PREVENTION PERTAINING TO THE HOME:  Any stairs in or around the home? No  If so, are there any without handrails? No  Home free of loose throw rugs in  walkways, pet beds, electrical cords, etc? Yes  Adequate lighting in your home to reduce risk of falls? Yes   ASSISTIVE DEVICES UTILIZED TO PREVENT FALLS:  Life alert? No  Use of a cane, walker or w/c? No  Grab bars in the bathroom? Yes  Shower chair or bench in shower? Yes  Elevated toilet seat or a handicapped toilet? Yes   TIMED UP AND GO:  Was the test performed? No . Telephonic visit.  Cognitive Function: Normal cognitive status assessed by direct observation by this Nurse Health Advisor. No abnormalities found.   MMSE - Mini Mental State Exam 08/04/2017 07/23/2016  Orientation to time 5 5  Orientation to Place 5 5  Registration 3 3  Attention/ Calculation 5 5  Recall 3 3  Language- name 2 objects 2 2  Language- repeat 1 1  Language- follow 3 step command 3 3  Language- read & follow direction 1 1  Write a sentence 1 1  Copy design 1 1  Total score 30 30     6CIT Screen 05/31/2019  What Year? 0 points  What month? 0 points  What time? 0 points  Count back from 20 0 points  Months in reverse 2 points  Repeat phrase 0 points  Total Score 2    Immunizations Immunization History  Administered Date(s) Administered  . Moderna Sars-Covid-2 Vaccination 07/09/2019, 08/10/2019, 04/21/2020  . Pneumococcal Conjugate-13 01/14/2014  . Pneumococcal Polysaccharide-23 03/06/2015  . Td 04/08/2001  . Tdap 01/07/2011    TDAP status: Up to date  Flu Vaccine status: Declined, Education has been provided regarding the importance of this vaccine but patient still declined. Advised may receive this vaccine at local pharmacy or Health Dept. Aware to provide a copy of the vaccination record if obtained from local pharmacy or Health Dept. Verbalized acceptance and understanding.  Pneumococcal vaccine status: Up to date  Covid-19 vaccine status: Completed vaccines  Qualifies for Shingles Vaccine? Yes   Zostavax completed No   Shingrix Completed?: No.    Education has been provided  regarding the importance of this vaccine. Patient has been advised to call insurance company to determine out of pocket expense if they have not yet received this vaccine. Advised may also receive vaccine at local pharmacy or Health Dept. Verbalized acceptance and understanding.  Screening Tests Health Maintenance  Topic Date Due  . DEXA SCAN  01/04/2019  . MAMMOGRAM  10/28/2020  . INFLUENZA VACCINE  11/06/2020  . TETANUS/TDAP  01/06/2021  . COLONOSCOPY (Pts 45-25yrs Insurance coverage will need to be confirmed)  02/04/2022  . COVID-19 Vaccine  Completed  . Hepatitis C Screening  Completed  . PNA vac Low Risk Adult  Completed  . HPV VACCINES  Aged Out    Health Maintenance  Health Maintenance Due  Topic Date Due  . DEXA SCAN  01/04/2019    Colorectal cancer screening: Type of screening: Colonoscopy. Completed 02/04/2017. Repeat every 5 years  Mammogram status: Completed 11/04/2019. Repeat every year  Bone Density status: Completed 01/03/2017. Results reflect: Bone density results: OSTEOPENIA.  Repeat every 2 years.  Lung Cancer Screening: (Low Dose CT Chest recommended if Age 14-80 years, 30 pack-year currently smoking OR have quit w/in 15years.) does not qualify.   Additional Screening:  Hepatitis C Screening: does qualify; Completed 01/03/2017  Vision Screening: Recommended annual ophthalmology exams for early detection of glaucoma and other disorders of the eye. Is the patient up to date with their annual eye exam?  Yes  Who is the provider or what is the name of the office in which the patient attends annual eye exams? [facility] If pt is not established with a provider, would they like to be referred to a provider to establish care? No .   Dental Screening: Recommended annual dental exams for proper oral hygiene  Community Resource Referral / Chronic Care Management: CRR required this visit?  No   CCM required this visit?  No      Plan:     I have personally reviewed  and noted the following in the patient's chart:   . Medical and social history . Use of alcohol, tobacco or illicit drugs  . Current medications and supplements . Functional ability and status . Nutritional status . Physical activity . Advanced directives . List of other physicians . Hospitalizations, surgeries, and ER visits in previous 12 months . Vitals . Screenings to include cognitive, depression, and falls . Referrals and appointments  In addition, I have reviewed and discussed with patient certain preventive protocols, quality metrics, and best practice recommendations. A written personalized care plan for preventive services as well as general preventive health recommendations were provided to patient.     Sandrea Hammond, LPN   [id number]/[id number]   Nurse Notes: None

## 2020-08-07 ENCOUNTER — Telehealth (INDEPENDENT_AMBULATORY_CARE_PROVIDER_SITE_OTHER): Payer: Medicare Other | Admitting: Family Medicine

## 2020-08-07 ENCOUNTER — Ambulatory Visit (INDEPENDENT_AMBULATORY_CARE_PROVIDER_SITE_OTHER): Payer: Medicare Other | Admitting: Dermatology

## 2020-08-07 ENCOUNTER — Encounter: Payer: Self-pay | Admitting: Family Medicine

## 2020-08-07 ENCOUNTER — Encounter: Payer: Self-pay | Admitting: Dermatology

## 2020-08-07 ENCOUNTER — Other Ambulatory Visit: Payer: Self-pay

## 2020-08-07 DIAGNOSIS — J209 Acute bronchitis, unspecified: Secondary | ICD-10-CM

## 2020-08-07 DIAGNOSIS — D485 Neoplasm of uncertain behavior of skin: Secondary | ICD-10-CM

## 2020-08-07 DIAGNOSIS — C4362 Malignant melanoma of left upper limb, including shoulder: Secondary | ICD-10-CM | POA: Diagnosis not present

## 2020-08-07 DIAGNOSIS — L570 Actinic keratosis: Secondary | ICD-10-CM

## 2020-08-07 MED ORDER — FLUCONAZOLE 150 MG PO TABS: 150.0000 mg | ORAL_TABLET | Freq: Once | ORAL | 0 refills | Status: AC

## 2020-08-07 MED ORDER — BENZONATATE 200 MG PO CAPS: 200.0000 mg | ORAL_CAPSULE | Freq: Three times a day (TID) | ORAL | 1 refills | Status: DC | PRN

## 2020-08-07 MED ORDER — PREDNISONE 20 MG PO TABS: ORAL_TABLET | ORAL | 0 refills | Status: DC

## 2020-08-07 MED ORDER — AMOXICILLIN-POT CLAVULANATE 875-125 MG PO TABS: 1.0000 | ORAL_TABLET | Freq: Two times a day (BID) | ORAL | 0 refills | Status: DC

## 2020-08-07 NOTE — Progress Notes (Signed)
Virtual Visit via Mychart video note  I connected with Selena Hunt on 08/07/20 at 1440 by video and verified that I am speaking with the correct person using two identifiers. Selena Hunt is currently located at home and patient are currently with her during visit. The provider, Fransisca Kaufmann Timonthy Hovater, MD is located in their office at time of visit.  Call ended at 1448  I discussed the limitations, risks, security and privacy concerns of performing an evaluation and management service by video and the availability of in person appointments. I also discussed with the patient that there may be a patient responsible charge related to this service. The patient expressed understanding and agreed to proceed.   History and Present Illness: Patient is starting with 1 week of cough and congestion.  She started using nebulizer and inhaler and oxygen.  She had a productive cough.  She was having a lot of wheezing over the weekend.  She denies fevers or chills.  She did have a home covid test that was negative. She coughs and then wets her pants.  Her O2 dropped to 88% and came up with oxygen. Her Oxygen is at a 2. She had 2 steroid pills left and took them and they helped.  She feels like things are worsening and not improving and that is why she is calling today.  No diagnosis found.  Outpatient Encounter Medications as of 08/07/2020  Medication Sig  . albuterol (PROVENTIL) (2.5 MG/3ML) 0.083% nebulizer solution Take 3 mLs (2.5 mg total) by nebulization every 6 (six) hours as needed for wheezing or shortness of breath.  Marland Kitchen albuterol (VENTOLIN HFA) 108 (90 Base) MCG/ACT inhaler Inhale 2 puffs into the lungs every 6 (six) hours as needed for wheezing or shortness of breath.  Marland Kitchen aspirin EC 81 MG tablet Take 81 mg by mouth daily.  . benzonatate (TESSALON) 200 MG capsule Take 200 mg by mouth 3 (three) times daily as needed.  . calcium-vitamin D (OSCAL WITH D) 500-200 MG-UNIT tablet Take 1 tablet by mouth daily  with breakfast.  . Crisaborole (EUCRISA) 2 % OINT Apply pea sized amount to affected areas on face once daily.  Marland Kitchen esomeprazole (NEXIUM) 40 MG capsule Take 1 capsule (40 mg total) by mouth 2 (two) times daily before a meal.  . fluticasone (FLONASE) 50 MCG/ACT nasal spray Place 2 sprays into both nostrils daily.  Marland Kitchen HYDROcodone-acetaminophen (NORCO/VICODIN) 5-325 MG tablet Take 1 tablet by mouth daily as needed for moderate pain or severe pain.  Marland Kitchen levothyroxine (SYNTHROID) 100 MCG tablet TAKE ONE WHOLE TABLET ON MONDAY AND FRIDAY, AND TAKE 1/2 TABLET ALL OTHER DAYS  . loratadine (CLARITIN) 10 MG tablet Take 1 tablet (10 mg total) by mouth daily.  . meclizine (ANTIVERT) 25 MG tablet Take 1 tablet (25 mg total) by mouth every 8 (eight) hours as needed for dizziness.  . metoprolol tartrate (LOPRESSOR) 50 MG tablet TAKE 1/2 TABLET BY MOUTH DAILY  . Multiple Vitamins-Minerals (WOMENS 50+ ADVANCED PO) Take by mouth.  . oxybutynin (DITROPAN-XL) 10 MG 24 hr tablet TAKE 1 TABLET BY MOUTH AT BEDTIME  . pravastatin (PRAVACHOL) 20 MG tablet TAKE 1 TABLET BY MOUTH EVERY DAY  . triamterene-hydrochlorothiazide (MAXZIDE-25) 37.5-25 MG tablet Take 0.5 tablets by mouth daily.  . Vitamin D, Ergocalciferol, (DRISDOL) 1.25 MG (50000 UNIT) CAPS capsule TAKE 1 CAPSULE BY MOUTH EVERY 7 DAYS   No facility-administered encounter medications on file as of 08/07/2020.    Review of Systems  Constitutional: Negative  for chills and fever.  HENT: Positive for congestion, postnasal drip and rhinorrhea. Negative for ear discharge, ear pain, sinus pressure, sneezing and sore throat.   Eyes: Negative for pain, redness and visual disturbance.  Respiratory: Positive for cough and wheezing. Negative for chest tightness and shortness of breath.   Cardiovascular: Negative for chest pain and leg swelling.  Musculoskeletal: Negative for back pain and gait problem.  Skin: Negative for rash.  Neurological: Negative for light-headedness and  headaches.  Psychiatric/Behavioral: Negative for agitation and behavioral problems.  All other systems reviewed and are negative.   Observations/Objective: Patient sounds hoarse but otherwise sounds comfortable and looks comfortable and in no acute distress  Assessment and Plan: Problem List Items Addressed This Visit   None   Visit Diagnoses    Acute bronchitis, unspecified organism    -  Primary   Relevant Medications   benzonatate (TESSALON) 200 MG capsule   amoxicillin-clavulanate (AUGMENTIN) 875-125 MG tablet   predniSONE (DELTASONE) 20 MG tablet   fluconazole (DIFLUCAN) 150 MG tablet      Will treat like possible bronchitis especially with her history of having lung issues recently with COVID.  If anything worsens or does not improve she is to call back immediately. Follow up plan: Return if symptoms worsen or fail to improve.     I discussed the assessment and treatment plan with the patient. The patient was provided an opportunity to ask questions and all were answered. The patient agreed with the plan and demonstrated an understanding of the instructions.   The patient was advised to call back or seek an in-person evaluation if the symptoms worsen or if the condition fails to improve as anticipated.  The above assessment and management plan was discussed with the patient. The patient verbalized understanding of and has agreed to the management plan. Patient is aware to call the clinic if symptoms persist or worsen. Patient is aware when to return to the clinic for a follow-up visit. Patient educated on when it is appropriate to go to the emergency department.    I provided 8 minutes of non-face-to-face time during this encounter.    Worthy Rancher, MD

## 2020-08-07 NOTE — Patient Instructions (Signed)

## 2020-08-14 ENCOUNTER — Telehealth (INDEPENDENT_AMBULATORY_CARE_PROVIDER_SITE_OTHER): Payer: Medicare Other | Admitting: Dermatology

## 2020-08-14 ENCOUNTER — Encounter: Payer: Self-pay | Admitting: Dermatology

## 2020-08-14 DIAGNOSIS — C4362 Malignant melanoma of left upper limb, including shoulder: Secondary | ICD-10-CM

## 2020-08-14 NOTE — Telephone Encounter (Signed)
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  All her questions were addressed.  This will be excised with 1 cm margins.  If I do not have an available appointment in a timely fashion, I have her permission to get this done if it can be done more quickly at the skin surgery center.  We will call Selena Hunt back once this is scheduled.

## 2020-08-14 NOTE — Telephone Encounter (Signed)
June 30 surgery visit with Dr Denna Haggard.

## 2020-08-15 NOTE — Progress Notes (Signed)
   Follow-Up Visit   Subjective  Selena Hunt Streat is a 70 y.o. female who presents for the following: Skin Problem (Left forearm- x 6 months- "dark").  New dark spot left arm Location:  Duration:  Quality:  Associated Signs/Symptoms: Modifying Factors:  Severity:  Timing: Context: Also crust on lip  Objective  Well appearing patient in no apparent distress; mood and affect are within normal limits. Objective  Mid Upper Vermilion Lip, Right Upper Back: Slightly inflamed subtle scale with multiple small AK's  Objective  Left Forearm - Anterior: Bi-chromic irregular 6 mm macule with marked dermoscopic atypia, suspect MIS.       A focused examination was performed including Head, neck, arms, back.. Relevant physical exam findings are noted in the Assessment and Plan.   Assessment & Plan    AK (actinic keratosis) (2) Right Upper Back; Mid Upper Vermilion Lip  OTC hydrocortisone ointment nightly for 2 weeks; will likely discuss PDT this winter or topical [place] 4% cream  Neoplasm of uncertain behavior of skin Left Forearm - Anterior  Skin / nail biopsy Type of biopsy: tangential   Informed consent: discussed and consent obtained   Timeout: patient name, date of birth, surgical site, and procedure verified   Procedure prep:  Patient was prepped and draped in usual sterile fashion (Non sterile) Prep type:  Chlorhexidine Anesthesia: the lesion was anesthetized in a standard fashion   Anesthetic:  1% lidocaine w/ epinephrine 1-100,000 local infiltration Instrument used: flexible razor blade   Outcome: patient tolerated procedure well   Post-procedure details: wound care instructions given    Specimen 1 - Surgical pathology Differential Diagnosis: r/o atypia  Check Margins: No      I, Lavonna Monarch, MD, have reviewed all documentation for this visit.  The documentation on 08/15/20 for the exam, diagnosis, procedures, and orders are all accurate and complete.

## 2020-08-17 DIAGNOSIS — J1282 Pneumonia due to coronavirus disease 2019: Secondary | ICD-10-CM | POA: Diagnosis not present

## 2020-08-17 DIAGNOSIS — U071 COVID-19: Secondary | ICD-10-CM | POA: Diagnosis not present

## 2020-08-17 NOTE — Addendum Note (Signed)
Addended by: Lavonna Monarch on: 08/17/2020 06:50 AM   Modules accepted: Level of Service

## 2020-08-21 ENCOUNTER — Telehealth: Payer: Self-pay

## 2020-08-21 NOTE — Telephone Encounter (Signed)
Ok to provide asking to be consider for excuse from jury duty due to impaired hearing and need for supplemental oxygen.  This may not actually excuse her but if she is ok with [place] listing medical issues, ok to provide note.

## 2020-08-21 NOTE — Telephone Encounter (Signed)
Note provided

## 2020-08-28 ENCOUNTER — Other Ambulatory Visit: Payer: Self-pay

## 2020-08-28 ENCOUNTER — Ambulatory Visit (INDEPENDENT_AMBULATORY_CARE_PROVIDER_SITE_OTHER): Payer: Medicare Other | Admitting: Family Medicine

## 2020-08-28 ENCOUNTER — Ambulatory Visit (INDEPENDENT_AMBULATORY_CARE_PROVIDER_SITE_OTHER): Payer: Medicare Other

## 2020-08-28 ENCOUNTER — Encounter: Payer: Self-pay | Admitting: Family Medicine

## 2020-08-28 DIAGNOSIS — R0602 Shortness of breath: Secondary | ICD-10-CM

## 2020-08-28 DIAGNOSIS — R059 Cough, unspecified: Secondary | ICD-10-CM

## 2020-08-28 DIAGNOSIS — K449 Diaphragmatic hernia without obstruction or gangrene: Secondary | ICD-10-CM | POA: Diagnosis not present

## 2020-08-28 MED ORDER — ALBUTEROL SULFATE HFA 108 (90 BASE) MCG/ACT IN AERS: 2.0000 | INHALATION_SPRAY | Freq: Four times a day (QID) | RESPIRATORY_TRACT | 0 refills | Status: DC | PRN

## 2020-08-28 NOTE — Progress Notes (Signed)
Virtual Visit  Note Due to COVID-19 pandemic this visit was conducted virtually. This visit type was conducted due to national recommendations for restrictions regarding the COVID-19 Pandemic (e.g. social distancing, sheltering in place) in an effort to limit this patient's exposure and mitigate transmission in our community. All issues noted in this document were discussed and addressed.  A physical exam was not performed with this format.  I connected with Selena Hunt on 08/28/20 at 0915 by telephone and verified that I am speaking with the correct person using two identifiers. Selena Hunt is currently located at home and no one is currently with her during the visit. The provider, Gwenlyn Perking, FNP is located in their office at time of visit.  I discussed the limitations, risks, security and privacy concerns of performing an evaluation and management service by telephone and the availability of in person appointments. I also discussed with the patient that there may be a patient responsible charge related to this service. The patient expressed understanding and agreed to proceed.  CC: shortness of breath  History and Present Illness:  HPI  Selena Hunt was seen via video on 08/07/20 for acute bronchitis following a week of increased cough, congestion, shortness of breath, and wheezing. She was treated with prednisone, augmentin, and tessalon perles. She reports that her symptoms have been improving but she still has more shortness of breath and cough than her baseline. She was on 1-2L of O2 when her symptoms started, now she is mostly down to 1L. She reports her sats have ben 90 or above. She continues to have a cough with yellow sputum. She is using her albuterol inhaler 1x a day and her nebulizer BID. She is still taking the prednisone which has been hlepfin. She has had negative home Covid tests. She denies fever, congestion, or chest pain. She has been having lung issues since having Covid 18  months ago. She has a breo inhaler at home but has not been using this.     ROS As per HPI.  Observations/Objective: Alert and oriented x 3. Able to speak in full sentences without difficulty.    Assessment and Plan: Selena Hunt was seen today for shortness of breath.  Diagnoses and all orders for this visit:  Shortness of breath With cough. Treated for acute bronchitis with abx, tessalon perles, and prednisone on 08/07/20. Reports improving symptoms, but continues to have increased SHOB and productive cough. Albuterol prn. Start breo inhaler daily. Continue 02 as needed. Rest, fluids, mucinex for congestion. Patient will come by office for CXR, will notify patient of results. Return to office for new or worsening symptoms, or if symptoms persist.  -     albuterol (VENTOLIN HFA) 108 (90 Base) MCG/ACT inhaler; Inhale 2 puffs into the lungs every 6 (six) hours as needed for wheezing or shortness of breath. -     DG Chest 2 View; Future  Cough -     DG Chest 2 View; Future     Follow Up Instructions: Return to office for new or worsening symptoms, or if symptoms persist.     I discussed the assessment and treatment plan with the patient. The patient was provided an opportunity to ask questions and all were answered. The patient agreed with the plan and demonstrated an understanding of the instructions.   The patient was advised to call back or seek an in-person evaluation if the symptoms worsen or if the condition fails to improve as anticipated.  The above  assessment and management plan was discussed with the patient. The patient verbalized understanding of and has agreed to the management plan. Patient is aware to call the clinic if symptoms persist or worsen. Patient is aware when to return to the clinic for a follow-up visit. Patient educated on when it is appropriate to go to the emergency department.   Time call ended:  0930  I provided 15 minutes of  non face-to-face time during this  encounter.    Gwenlyn Perking, FNP

## 2020-08-29 ENCOUNTER — Other Ambulatory Visit: Payer: Self-pay | Admitting: Family Medicine

## 2020-08-29 DIAGNOSIS — I10 Essential (primary) hypertension: Secondary | ICD-10-CM

## 2020-09-02 DIAGNOSIS — H1033 Unspecified acute conjunctivitis, bilateral: Secondary | ICD-10-CM | POA: Diagnosis not present

## 2020-09-05 ENCOUNTER — Ambulatory Visit: Payer: Medicare Other | Admitting: Dermatology

## 2020-09-14 DIAGNOSIS — H8111 Benign paroxysmal vertigo, right ear: Secondary | ICD-10-CM | POA: Diagnosis not present

## 2020-09-14 DIAGNOSIS — Z9621 Cochlear implant status: Secondary | ICD-10-CM | POA: Diagnosis not present

## 2020-09-14 DIAGNOSIS — H903 Sensorineural hearing loss, bilateral: Secondary | ICD-10-CM | POA: Diagnosis not present

## 2020-09-14 DIAGNOSIS — Z8616 Personal history of COVID-19: Secondary | ICD-10-CM | POA: Diagnosis not present

## 2020-09-14 DIAGNOSIS — H8101 Meniere's disease, right ear: Secondary | ICD-10-CM | POA: Insufficient documentation

## 2020-09-14 DIAGNOSIS — Z9981 Dependence on supplemental oxygen: Secondary | ICD-10-CM | POA: Diagnosis not present

## 2020-09-17 DIAGNOSIS — U071 COVID-19: Secondary | ICD-10-CM | POA: Diagnosis not present

## 2020-09-17 DIAGNOSIS — J1282 Pneumonia due to coronavirus disease 2019: Secondary | ICD-10-CM | POA: Diagnosis not present

## 2020-10-05 ENCOUNTER — Other Ambulatory Visit: Payer: Self-pay | Admitting: Dermatology

## 2020-10-05 ENCOUNTER — Other Ambulatory Visit: Payer: Self-pay

## 2020-10-05 ENCOUNTER — Ambulatory Visit (INDEPENDENT_AMBULATORY_CARE_PROVIDER_SITE_OTHER): Payer: Medicare Other | Admitting: Dermatology

## 2020-10-05 DIAGNOSIS — C4362 Malignant melanoma of left upper limb, including shoulder: Secondary | ICD-10-CM | POA: Diagnosis not present

## 2020-10-05 DIAGNOSIS — C439 Malignant melanoma of skin, unspecified: Secondary | ICD-10-CM

## 2020-10-05 DIAGNOSIS — L905 Scar conditions and fibrosis of skin: Secondary | ICD-10-CM | POA: Diagnosis not present

## 2020-10-05 NOTE — Patient Instructions (Signed)

## 2020-10-17 DIAGNOSIS — J1282 Pneumonia due to coronavirus disease 2019: Secondary | ICD-10-CM | POA: Diagnosis not present

## 2020-10-17 DIAGNOSIS — U071 COVID-19: Secondary | ICD-10-CM | POA: Diagnosis not present

## 2020-10-19 ENCOUNTER — Ambulatory Visit (INDEPENDENT_AMBULATORY_CARE_PROVIDER_SITE_OTHER): Payer: Medicare Other | Admitting: *Deleted

## 2020-10-19 ENCOUNTER — Other Ambulatory Visit: Payer: Self-pay

## 2020-10-19 DIAGNOSIS — Z4802 Encounter for removal of sutures: Secondary | ICD-10-CM

## 2020-10-19 NOTE — Progress Notes (Signed)
Here for NTS suture removal. No signs or symptoms of infection. Path to patient. 

## 2020-10-22 ENCOUNTER — Encounter: Payer: Self-pay | Admitting: Dermatology

## 2020-10-22 NOTE — Progress Notes (Signed)
   Follow-Up Visit   Subjective  Selena Hunt is a 70 y.o. female who presents for the following: Procedure (Patient here today for MM of left forearm ).  Melanoma left forearm Location:  Duration:  Quality:  Associated Signs/Symptoms: Modifying Factors:  Severity:  Timing: Context:   Objective  Well appearing patient in no apparent distress; mood and affect are within normal limits. Left Forearm - Anterior Lesion identified by nurse and Dr.Sanyla Summey in room.     A focused examination was performed including left arm. Relevant physical exam findings are noted in the Assessment and Plan.   Assessment & Plan    Melanoma of skin ([place]) Left Forearm - Anterior  Skin excision  Lesion length (cm):  0.8 Lesion width (cm):  0.8 Margin per side (cm):  0.9 Total excision diameter (cm):  2.6 Informed consent: discussed and consent obtained   Timeout: patient name, date of birth, surgical site, and procedure verified   Procedure prep:  Patient was prepped and draped in usual sterile fashion Prep type:  Chlorhexidine Anesthesia: the lesion was anesthetized in a standard fashion   Anesthetic:  1% lidocaine w/ epinephrine 1-100,000 local infiltration Instrument used: #15 blade   Hemostasis achieved with: suture   Outcome: patient tolerated procedure well with no complications   Post-procedure details: sterile dressing applied and wound care instructions given   Dressing type: petrolatum   Additional details:  4-0-vicryl x 4  3-0-nylon -13  Skin repair Complexity:  Intermediate Final length (cm):  5.2 Informed consent: discussed and consent obtained   Timeout: patient name, date of birth, surgical site, and procedure verified   Procedure prep:  Patient was prepped and draped in usual sterile fashion Prep type:  Chlorhexidine Anesthesia: the lesion was anesthetized in a standard fashion   Reason for type of repair: reduce tension to allow closure and reduce the risk of  dehiscence, infection, and necrosis   Undermining: edges undermined   Subcutaneous layers (deep stitches):  Suture size:  4-0 Suture type: Vicryl (polyglactin 910)   Stitches:  Buried vertical mattress Fine/surface layer approximation (top stitches):  Suture size:  3-0 Suture type: nylon   Stitches: horizontal mattress and simple interrupted   Suture removal (days):  14 Hemostasis achieved with: suture Outcome: patient tolerated procedure well with no complications   Post-procedure details: sterile dressing applied   Dressing type: petrolatum    Specimen 1 - Surgical pathology Differential Diagnosis: Malignant melanoma [id number] Check Margins: proximal margin stain  19mm margins marked.  Ellipse taken to deep subQ and layered closure performed.      I, Lavonna Monarch, MD, have reviewed all documentation for this visit.  The documentation on 10/22/20 for the exam, diagnosis, procedures, and orders are all accurate and complete.

## 2020-11-03 DIAGNOSIS — Z1231 Encounter for screening mammogram for malignant neoplasm of breast: Secondary | ICD-10-CM | POA: Diagnosis not present

## 2020-11-17 DIAGNOSIS — U071 COVID-19: Secondary | ICD-10-CM | POA: Diagnosis not present

## 2020-11-17 DIAGNOSIS — J1282 Pneumonia due to coronavirus disease 2019: Secondary | ICD-10-CM | POA: Diagnosis not present

## 2020-11-26 ENCOUNTER — Other Ambulatory Visit: Payer: Self-pay | Admitting: Family Medicine

## 2020-11-26 DIAGNOSIS — I10 Essential (primary) hypertension: Secondary | ICD-10-CM

## 2020-12-12 ENCOUNTER — Ambulatory Visit: Payer: Medicare Other | Admitting: Family Medicine

## 2020-12-18 DIAGNOSIS — U071 COVID-19: Secondary | ICD-10-CM | POA: Diagnosis not present

## 2020-12-18 DIAGNOSIS — J1282 Pneumonia due to coronavirus disease 2019: Secondary | ICD-10-CM | POA: Diagnosis not present

## 2020-12-19 DIAGNOSIS — H04123 Dry eye syndrome of bilateral lacrimal glands: Secondary | ICD-10-CM | POA: Diagnosis not present

## 2020-12-26 ENCOUNTER — Other Ambulatory Visit: Payer: Self-pay | Admitting: Family Medicine

## 2020-12-26 DIAGNOSIS — R600 Localized edema: Secondary | ICD-10-CM

## 2021-01-02 ENCOUNTER — Ambulatory Visit (INDEPENDENT_AMBULATORY_CARE_PROVIDER_SITE_OTHER): Payer: Medicare Other

## 2021-01-02 ENCOUNTER — Ambulatory Visit (INDEPENDENT_AMBULATORY_CARE_PROVIDER_SITE_OTHER): Payer: Medicare Other | Admitting: Family Medicine

## 2021-01-02 ENCOUNTER — Encounter: Payer: Self-pay | Admitting: Family Medicine

## 2021-01-02 ENCOUNTER — Other Ambulatory Visit: Payer: Self-pay

## 2021-01-02 VITALS — BP 126/80 | HR 72 | Temp 97.4°F | Ht 61.0 in | Wt 199.8 lb

## 2021-01-02 DIAGNOSIS — R0602 Shortness of breath: Secondary | ICD-10-CM | POA: Diagnosis not present

## 2021-01-02 DIAGNOSIS — I129 Hypertensive chronic kidney disease with stage 1 through stage 4 chronic kidney disease, or unspecified chronic kidney disease: Secondary | ICD-10-CM

## 2021-01-02 DIAGNOSIS — E034 Atrophy of thyroid (acquired): Secondary | ICD-10-CM | POA: Diagnosis not present

## 2021-01-02 DIAGNOSIS — R7689 Other specified abnormal immunological findings in serum: Secondary | ICD-10-CM | POA: Insufficient documentation

## 2021-01-02 DIAGNOSIS — Z780 Asymptomatic menopausal state: Secondary | ICD-10-CM | POA: Diagnosis not present

## 2021-01-02 DIAGNOSIS — N1831 Chronic kidney disease, stage 3a: Secondary | ICD-10-CM | POA: Diagnosis not present

## 2021-01-02 DIAGNOSIS — R768 Other specified abnormal immunological findings in serum: Secondary | ICD-10-CM | POA: Diagnosis not present

## 2021-01-02 MED ORDER — BUDESONIDE-FORMOTEROL FUMARATE 80-4.5 MCG/ACT IN AERO: 2.0000 | INHALATION_SPRAY | Freq: Two times a day (BID) | RESPIRATORY_TRACT | 3 refills | Status: DC

## 2021-01-02 MED ORDER — ALBUTEROL SULFATE HFA 108 (90 BASE) MCG/ACT IN AERS: 2.0000 | INHALATION_SPRAY | Freq: Four times a day (QID) | RESPIRATORY_TRACT | 0 refills | Status: DC | PRN

## 2021-01-02 NOTE — Progress Notes (Signed)
Subjective: QV:ZDGLOVFIEPPIRJ, CKD3 PCP: Janora Norlander, DO JOA:CZYS E Gibb is a 70 y.o. female presenting to clinic today for:  1. Hypothyroidism Compliant with Synthroid.  No reports of change in voice, tremor, heart palpitations or change in bowel habits  2. CKD3a Continues to have good urine output.  In fact sometimes it is too good and this is when she takes Ditropan because she has urge incontinence.  3.  Chronic respiratory failure Patient continues to require oxygen at nighttime and sometimes during the day.  This has been an issue since she had COVID-19.  In fact she reports that recently she went to donate blood at the [facility] and was told that she has red blood cell antibodies, anti-K, and was instructed to tell this to me during today's visit.  It apparently limits whom she can donate blood to follow-up.  She uses albuterol intermittently.  She has found steroids to be helpful but understands that she cannot use that long-term.  She has tried a corticosteroid inhaler, Breo, this gave her severe thrush.  Would be willing to try something else.    ROS: Per HPI  Allergies  Allergen Reactions   Ambien [Zolpidem Tartrate] Anxiety   Diovan [Valsartan] Other (See Comments)    Seizures.   Wellbutrin [Bupropion] Other (See Comments)    Seizures.   Codeine     Feel crazy and dizzy    Feldene [Piroxicam]     Swelling    Guaifenesin Er     Raise Bp / HR    Latex     Swelling    Mobic [Meloxicam] Other (See Comments)    Stomach cramps   Neurontin [Gabapentin]     "loopy"   Crestor [Rosuvastatin Calcium] Other (See Comments)    Myalgias    Lipitor [Atorvastatin Calcium] Other (See Comments)    myalgias   Livalo [Pitavastatin] Swelling and Other (See Comments)    stomach cramping    Simcor [Niacin-Simvastatin Er] Other (See Comments)    Stomach cramps   Zocor [Simvastatin] Other (See Comments)    Stomach cramps   Past Medical History:  Diagnosis  Date   Benign neoplasm of colon    Blood transfusion without reported diagnosis    x 34 to date   Chronic kidney disease    Colon polyps    Diverticulitis of colon (without mention of hemorrhage)(562.11)    Diverticulosis of colon (without mention of hemorrhage)    Essential hypertension, benign    Female stress incontinence    GERD (gastroesophageal reflux disease)    Hematuria    Hernia of unspecified site of abdominal cavity without mention of obstruction or gangrene    History of closed head injury 04/21/2018   Kidney stones    has had one    Lumbosacral spondylosis without myelopathy    Lymphedema    Meniere disease    Drs are uncertain of this DX    Migraine headache    Neuromuscular disorder ([place])    Obesity    Other and unspecified hyperlipidemia    Pneumonia    Pulmonary embolism ([place])    Seizures ([place])    HX of --- from medications,2003   Stricture and stenosis of esophagus    Thyroid disease    Ventral hernia     Current Outpatient Medications:    albuterol (PROVENTIL) (2.5 MG/3ML) 0.083% nebulizer solution, Take 3 mLs (2.5 mg total) by nebulization every 6 (six) hours as needed for wheezing or  shortness of breath., Disp: 150 mL, Rfl: 1   albuterol (VENTOLIN HFA) 108 (90 Base) MCG/ACT inhaler, Inhale 2 puffs into the lungs every 6 (six) hours as needed for wheezing or shortness of breath., Disp: 8 g, Rfl: 0   aspirin EC 81 MG tablet, Take 81 mg by mouth daily., Disp: , Rfl:    calcium-vitamin D (OSCAL WITH D) 500-200 MG-UNIT tablet, Take 1 tablet by mouth daily with breakfast., Disp: 90 tablet, Rfl: 1   esomeprazole (NEXIUM) 40 MG capsule, Take 1 capsule (40 mg total) by mouth 2 (two) times daily before a meal., Disp: 60 capsule, Rfl: 3   fluticasone (FLONASE) 50 MCG/ACT nasal spray, Place 2 sprays into both nostrils daily., Disp: 16 g, Rfl: 6   HYDROcodone-acetaminophen (NORCO/VICODIN) 5-325 MG tablet, Take 1 tablet by mouth daily as needed for moderate pain or  severe pain., Disp: 30 tablet, Rfl: 0   levothyroxine (SYNTHROID) 100 MCG tablet, TAKE ONE WHOLE TABLET ON MONDAY AND FRIDAY, AND TAKE 1/2 TABLET ALL OTHER DAYS, Disp: 60 tablet, Rfl: 1   meclizine (ANTIVERT) 25 MG tablet, Take 1 tablet (25 mg total) by mouth every 8 (eight) hours as needed for dizziness., Disp: 30 tablet, Rfl: 2   metoprolol tartrate (LOPRESSOR) 50 MG tablet, TAKE 1/2 TABLET BY MOUTH DAILY, Disp: 45 tablet, Rfl: 0   Multiple Vitamins-Minerals (WOMENS 50+ ADVANCED PO), Take by mouth., Disp: , Rfl:    oxybutynin (DITROPAN-XL) 10 MG 24 hr tablet, TAKE 1 TABLET BY MOUTH AT BEDTIME, Disp: 30 tablet, Rfl: 0   pravastatin (PRAVACHOL) 20 MG tablet, TAKE 1 TABLET BY MOUTH EVERY DAY, Disp: 90 tablet, Rfl: 0   triamterene-hydrochlorothiazide (MAXZIDE-25) 37.5-25 MG tablet, TAKE 1/2 TABLET BY MOUTH EVERY DAY, Disp: 90 tablet, Rfl: 0   Vitamin D, Ergocalciferol, (DRISDOL) 1.25 MG (50000 UNIT) CAPS capsule, TAKE 1 CAPSULE BY MOUTH EVERY 7 DAYS, Disp: 12 capsule, Rfl: 3 Social History   Socioeconomic History   Marital status: Widowed    Spouse name: Makenzee Choudhry   Number of children: 2   Years of education: Not on file   Highest education level: 12th grade  Occupational History   Occupation: Retired    Fish farm manager: Engineer, [employer]    Comment: 2005   Tobacco Use   Smoking status: Never   Smokeless tobacco: Never  Vaping Use   Vaping Use: Never used  Substance and Sexual Activity   Alcohol use: Not Currently    Alcohol/week: 0.0 standard drinks    Comment: wine once or twice a year   Drug use: No   Sexual activity: Never    Birth control/protection: Post-menopausal  Other Topics Concern   Not on file  Social History Narrative   Lives alone.    Caffeine 2 cups daily    Right handed   Social Determinants of Health   Financial Resource Strain: Low Risk    Difficulty of Paying Living Expenses: Not hard at all  Food Insecurity: No Food Insecurity   Worried About Ship broker in the Last Year: Never true   Ran Out of Food in the Last Year: Never true  Transportation Needs: No Transportation Needs   Lack of Transportation (Medical): No   Lack of Transportation (Non-Medical): No  Physical Activity: Insufficiently Active   Days of Exercise per Week: 7 days   Minutes of Exercise per Session: 10 min  Stress: No Stress Concern Present   Feeling of Stress : Not at all  Social  Connections: Socially Integrated   Frequency of Communication with Friends and Family: Three times a week   Frequency of Social Gatherings with Friends and Family: More than three times a week   Attends Religious Services: More than 4 times per year   Active Member of [employer] or Organizations: Yes   Attends Archivist Meetings: Never   Marital Status: Married  Human resources officer Violence: Not on file   Family History  Problem Relation Age of Onset   Alzheimer's disease Mother    Hypertension Mother    Stroke Mother    Alzheimer's disease Father    Hypertension Father    Gout Brother    Fibromyalgia Daughter    Hypertension Son    Depression Son    Spondylitis Son        spondylosis   GER disease Son    Heart disease Maternal Grandmother    Heart disease Maternal Grandfather    Congestive Heart Failure Maternal Grandfather    Stroke Paternal Grandmother    Hypertension Paternal Grandmother    Depression Paternal Grandfather    Suicidality Paternal Grandfather    Colon cancer Neg Hx    Esophageal cancer Neg Hx    Pancreatic cancer Neg Hx    Stomach cancer Neg Hx    Liver disease Neg Hx     Objective: Office vital signs reviewed. BP 126/80   Pulse 72   Temp (!) 97.4 F (36.3 C)   Ht 5\' 1"  (1.549 m)   Wt 199 lb 12.8 oz (90.6 kg)   SpO2 93%   BMI 37.75 kg/m   Physical Examination:  General: Awake, alert, [place] ill-appearing female, No acute distress HEENT: Normal; no exophthalmos or goiter Cardio: regular rate and rhythm, S1S2 heard, no murmurs  appreciated Pulm: clear to auscultation bilaterally, no wheezes, rhonchi or rales; normal work of breathing on room air MSK: slow, antalgic gait and station; has compression hose on bilateral lower extremities   Assessment/ Plan: 70 y.o. female   Hypothyroidism due to acquired atrophy of thyroid - Plan: TSH, T4, Free  Hypertensive kidney disease with stage 3a chronic kidney disease ([place]) - Plan: Renal Function Panel  Shortness of breath - Plan: albuterol (VENTOLIN HFA) 108 (90 Base) MCG/ACT inhaler, budesonide-formoterol (SYMBICORT) 80-4.5 MCG/ACT inhaler  Red blood cell antibody positive  Post-menopausal - Plan: DG [facility] DEXA  Check TSH, free T4.  Continue Synthroid  Check renal function panel.  Blood pressure was within acceptable range  Trial of Symbicort.  Reinforced need to wash mouth out after each use  Uncertain the impact as to what red blood cell antibody will have on her nor why she suddenly has become positive for anti-K.  DEXA ordered  No orders of the defined types were placed in this encounter.  No orders of the defined types were placed in this encounter.    Janora Norlander, DO [place] [phone]

## 2021-01-03 ENCOUNTER — Other Ambulatory Visit: Payer: Self-pay | Admitting: Family Medicine

## 2021-01-03 ENCOUNTER — Encounter: Payer: Self-pay | Admitting: Family Medicine

## 2021-01-03 DIAGNOSIS — G8921 Chronic pain due to trauma: Secondary | ICD-10-CM

## 2021-01-03 LAB — RENAL FUNCTION PANEL
Albumin: 4.3 g/dL (ref 3.7–4.7)
BUN/Creatinine Ratio: 24 (ref 12–28)
BUN: 27 mg/dL (ref 8–27)
CO2: 26 mmol/L (ref 20–29)
Calcium: 10 mg/dL (ref 8.7–10.3)
Chloride: 99 mmol/L (ref 96–106)
Creatinine, Ser: 1.13 mg/dL — ABNORMAL HIGH (ref 0.57–1.00)
Glucose: 101 mg/dL — ABNORMAL HIGH (ref 70–99)
Phosphorus: 3 mg/dL (ref 3.0–4.3)
Potassium: 4 mmol/L (ref 3.5–5.2)
Sodium: 140 mmol/L (ref 134–144)
eGFR: 52 mL/min/{1.73_m2} — ABNORMAL LOW (ref >59)

## 2021-01-03 LAB — T4, FREE: Free T4: 1.28 ng/dL (ref 0.82–1.77)

## 2021-01-03 LAB — TSH: TSH: 1.49 u[IU]/mL (ref 0.450–4.500)

## 2021-01-03 MED ORDER — LEVOTHYROXINE SODIUM 100 MCG PO TABS: ORAL_TABLET | ORAL | 1 refills | Status: DC

## 2021-01-03 NOTE — Progress Notes (Signed)
Pt returning call. Please call back at [phone]

## 2021-01-03 NOTE — Telephone Encounter (Signed)
Unfortunately, this was not discussed during our visit yesterday and she is NOT up to date on [facility]/ Drug screen.  She will need to come back for this.

## 2021-01-09 DIAGNOSIS — M8589 Other specified disorders of bone density and structure, multiple sites: Secondary | ICD-10-CM | POA: Diagnosis not present

## 2021-01-09 DIAGNOSIS — Z780 Asymptomatic menopausal state: Secondary | ICD-10-CM | POA: Diagnosis not present

## 2021-01-17 DIAGNOSIS — J1282 Pneumonia due to coronavirus disease 2019: Secondary | ICD-10-CM | POA: Diagnosis not present

## 2021-01-17 DIAGNOSIS — U071 COVID-19: Secondary | ICD-10-CM | POA: Diagnosis not present

## 2021-01-18 ENCOUNTER — Encounter: Payer: Self-pay | Admitting: *Deleted

## 2021-01-23 ENCOUNTER — Other Ambulatory Visit: Payer: Self-pay | Admitting: Family Medicine

## 2021-01-23 DIAGNOSIS — E559 Vitamin D deficiency, unspecified: Secondary | ICD-10-CM

## 2021-01-25 ENCOUNTER — Other Ambulatory Visit: Payer: Self-pay | Admitting: Family Medicine

## 2021-02-05 ENCOUNTER — Other Ambulatory Visit: Payer: Self-pay | Admitting: Family Medicine

## 2021-02-05 DIAGNOSIS — E559 Vitamin D deficiency, unspecified: Secondary | ICD-10-CM

## 2021-02-17 DIAGNOSIS — U071 COVID-19: Secondary | ICD-10-CM | POA: Diagnosis not present

## 2021-02-17 DIAGNOSIS — J1282 Pneumonia due to coronavirus disease 2019: Secondary | ICD-10-CM | POA: Diagnosis not present

## 2021-02-25 ENCOUNTER — Other Ambulatory Visit: Payer: Self-pay | Admitting: Family Medicine

## 2021-02-25 DIAGNOSIS — I10 Essential (primary) hypertension: Secondary | ICD-10-CM

## 2021-03-16 DIAGNOSIS — H8101 Meniere's disease, right ear: Secondary | ICD-10-CM | POA: Diagnosis not present

## 2021-03-16 DIAGNOSIS — H903 Sensorineural hearing loss, bilateral: Secondary | ICD-10-CM | POA: Diagnosis not present

## 2021-03-16 DIAGNOSIS — Z9621 Cochlear implant status: Secondary | ICD-10-CM | POA: Diagnosis not present

## 2021-03-16 DIAGNOSIS — H8111 Benign paroxysmal vertigo, right ear: Secondary | ICD-10-CM | POA: Diagnosis not present

## 2021-03-16 DIAGNOSIS — Z9629 Presence of other otological and audiological implants: Secondary | ICD-10-CM | POA: Diagnosis not present

## 2021-03-19 DIAGNOSIS — J1282 Pneumonia due to coronavirus disease 2019: Secondary | ICD-10-CM | POA: Diagnosis not present

## 2021-03-19 DIAGNOSIS — U071 COVID-19: Secondary | ICD-10-CM | POA: Diagnosis not present

## 2021-03-25 ENCOUNTER — Other Ambulatory Visit: Payer: Self-pay | Admitting: Family Medicine

## 2021-03-25 DIAGNOSIS — R0602 Shortness of breath: Secondary | ICD-10-CM

## 2021-04-19 DIAGNOSIS — J1282 Pneumonia due to coronavirus disease 2019: Secondary | ICD-10-CM | POA: Diagnosis not present

## 2021-04-19 DIAGNOSIS — U071 COVID-19: Secondary | ICD-10-CM | POA: Diagnosis not present

## 2021-05-07 ENCOUNTER — Ambulatory Visit (INDEPENDENT_AMBULATORY_CARE_PROVIDER_SITE_OTHER): Payer: Medicare Other | Admitting: Family Medicine

## 2021-05-07 ENCOUNTER — Encounter: Payer: Self-pay | Admitting: Family Medicine

## 2021-05-07 ENCOUNTER — Other Ambulatory Visit: Payer: Self-pay

## 2021-05-07 DIAGNOSIS — J441 Chronic obstructive pulmonary disease with (acute) exacerbation: Secondary | ICD-10-CM

## 2021-05-07 DIAGNOSIS — J9601 Acute respiratory failure with hypoxia: Secondary | ICD-10-CM | POA: Diagnosis not present

## 2021-05-07 MED ORDER — AZITHROMYCIN 250 MG PO TABS: ORAL_TABLET | ORAL | 0 refills | Status: DC

## 2021-05-07 MED ORDER — PREDNISONE 10 MG PO TABS: ORAL_TABLET | ORAL | 0 refills | Status: DC

## 2021-05-07 MED ORDER — ALBUTEROL SULFATE (2.5 MG/3ML) 0.083% IN NEBU: 2.5000 mg | INHALATION_SOLUTION | Freq: Four times a day (QID) | RESPIRATORY_TRACT | 1 refills | Status: DC | PRN

## 2021-05-07 MED ORDER — BENZONATATE 200 MG PO CAPS: 200.0000 mg | ORAL_CAPSULE | Freq: Three times a day (TID) | ORAL | 0 refills | Status: DC | PRN

## 2021-05-07 NOTE — Progress Notes (Signed)
Subjective:    Patient ID: Selena Hunt, female    DOB: 1951-03-30, 71 y.o.   MRN: [id number]   HPI: Selena Hunt is a 71 y.o. female presenting for head and chest congestion. Coughing up a lot of green sputum. Lungs scarred from frequent pneumonias. Uses O2 due to Covid 2 years ago. Has increased it from hs to 24 hours a day. Was dyspneic yesterday. Also today.    Depression screen [facility] 2/9 01/02/2021 07/28/2020 06/09/2020 12/09/2019 09/08/2019  Decreased Interest 0 0 0 0 0  Down, Depressed, Hopeless 0 0 0 0 0  PHQ - 2 Score 0 0 0 0 0  Altered sleeping 1 0 0 - 0  Tired, decreased energy 1 0 0 - 0  Change in appetite 0 0 0 - 0  Feeling bad or failure about yourself  0 0 0 - 0  Trouble concentrating 0 0 0 - 0  Moving slowly or fidgety/restless 0 0 0 - 0  Suicidal thoughts 0 0 0 - 0  PHQ-9 Score 2 0 0 - 0  Difficult doing work/chores Not difficult at all - - - -  Some recent data might be hidden     Relevant past medical, surgical, family and social history reviewed and updated as indicated.  Interim medical history since our last visit reviewed. Allergies and medications reviewed and updated.  ROS:  Review of Systems  Constitutional:  Negative for activity change, appetite change, chills and fever.  HENT:  Positive for congestion, postnasal drip, rhinorrhea and sinus pressure. Negative for ear discharge, ear pain, hearing loss, nosebleeds, sneezing and trouble swallowing.   Respiratory:  Positive for cough and shortness of breath. Negative for chest tightness.   Cardiovascular:  Negative for chest pain and palpitations.  Skin:  Negative for rash.    Social History   Tobacco Use  Smoking Status Never  Smokeless Tobacco Never       Objective:     Wt Readings from Last 3 Encounters:  01/02/21 199 lb 12.8 oz (90.6 kg)  07/28/20 194 lb (88 kg)  06/09/20 200 lb (90.7 kg)     Exam deferred. Pt. Harboring due to COVID 19. Phone visit performed.   Assessment & Plan:   1.  Acute exacerbation of chronic obstructive pulmonary disease (COPD) ([place])   2. Acute respiratory failure with hypoxia ([facility])     Meds ordered this encounter  Medications   albuterol (PROVENTIL) (2.5 MG/3ML) 0.083% nebulizer solution    Sig: Take 3 mLs (2.5 mg total) by nebulization every 6 (six) hours as needed for wheezing or shortness of breath.    Dispense:  150 mL    Refill:  1   azithromycin (ZITHROMAX Z-PAK) 250 MG tablet    Sig: Take two right away Then one a day for the next 4 days.    Dispense:  6 each    Refill:  0   benzonatate (TESSALON) 200 MG capsule    Sig: Take 1 capsule (200 mg total) by mouth 3 (three) times daily as needed for cough.    Dispense:  20 capsule    Refill:  0   predniSONE (DELTASONE) 10 MG tablet    Sig: Take 5 daily for 2 days followed by 4,3,2 and 1 for 2 days each.    Dispense:  30 tablet    Refill:  0    No orders of the defined types were placed in this encounter.     Diagnoses  and all orders for this visit:  Acute exacerbation of chronic obstructive pulmonary disease (COPD) ([place])  Acute respiratory failure with hypoxia ([facility]) -     albuterol (PROVENTIL) (2.5 MG/3ML) 0.083% nebulizer solution; Take 3 mLs (2.5 mg total) by nebulization every 6 (six) hours as needed for wheezing or shortness of breath.  Other orders -     azithromycin (ZITHROMAX Z-PAK) 250 MG tablet; Take two right away Then one a day for the next 4 days. -     benzonatate (TESSALON) 200 MG capsule; Take 1 capsule (200 mg total) by mouth 3 (three) times daily as needed for cough. -     predniSONE (DELTASONE) 10 MG tablet; Take 5 daily for 2 days followed by 4,3,2 and 1 for 2 days each.    Virtual Visit via telephone Note  I discussed the limitations, risks, security and privacy concerns of performing an evaluation and management service by telephone and the availability of in person appointments. The patient was identified with two identifiers. Pt.expressed understanding and  agreed to proceed. Pt. Is at home. Dr. Livia Snellen is in his office.  Follow Up Instructions:   I discussed the assessment and treatment plan with the patient. The patient was provided an opportunity to ask questions and all were answered. The patient agreed with the plan and demonstrated an understanding of the instructions.   The patient was advised to call back or seek an in-person evaluation if the symptoms worsen or if the condition fails to improve as anticipated.   Total minutes including chart review and phone contact time: 13   Follow up plan: Return if symptoms worsen or fail to improve.  Claretta Fraise, MD [place]

## 2021-05-20 DIAGNOSIS — U071 COVID-19: Secondary | ICD-10-CM | POA: Diagnosis not present

## 2021-05-20 DIAGNOSIS — J1282 Pneumonia due to coronavirus disease 2019: Secondary | ICD-10-CM | POA: Diagnosis not present

## 2021-05-28 ENCOUNTER — Other Ambulatory Visit: Payer: Self-pay | Admitting: Family Medicine

## 2021-05-28 DIAGNOSIS — R0602 Shortness of breath: Secondary | ICD-10-CM

## 2021-06-17 DIAGNOSIS — U071 COVID-19: Secondary | ICD-10-CM | POA: Diagnosis not present

## 2021-06-17 DIAGNOSIS — J1282 Pneumonia due to coronavirus disease 2019: Secondary | ICD-10-CM | POA: Diagnosis not present

## 2021-06-25 ENCOUNTER — Other Ambulatory Visit: Payer: Self-pay | Admitting: Family Medicine

## 2021-06-25 DIAGNOSIS — R600 Localized edema: Secondary | ICD-10-CM

## 2021-06-29 DIAGNOSIS — Z885 Allergy status to narcotic agent status: Secondary | ICD-10-CM | POA: Diagnosis not present

## 2021-06-29 DIAGNOSIS — Z886 Allergy status to analgesic agent status: Secondary | ICD-10-CM | POA: Diagnosis not present

## 2021-06-29 DIAGNOSIS — H903 Sensorineural hearing loss, bilateral: Secondary | ICD-10-CM | POA: Diagnosis not present

## 2021-06-29 DIAGNOSIS — Z91040 Latex allergy status: Secondary | ICD-10-CM | POA: Diagnosis not present

## 2021-06-29 DIAGNOSIS — Z01818 Encounter for other preprocedural examination: Secondary | ICD-10-CM | POA: Diagnosis not present

## 2021-06-29 DIAGNOSIS — Z9621 Cochlear implant status: Secondary | ICD-10-CM | POA: Diagnosis not present

## 2021-06-29 DIAGNOSIS — H8111 Benign paroxysmal vertigo, right ear: Secondary | ICD-10-CM | POA: Diagnosis not present

## 2021-06-29 DIAGNOSIS — Z888 Allergy status to other drugs, medicaments and biological substances status: Secondary | ICD-10-CM | POA: Diagnosis not present

## 2021-06-29 DIAGNOSIS — H8101 Meniere's disease, right ear: Secondary | ICD-10-CM | POA: Diagnosis not present

## 2021-07-02 ENCOUNTER — Encounter: Payer: Self-pay | Admitting: Family Medicine

## 2021-07-02 ENCOUNTER — Ambulatory Visit (INDEPENDENT_AMBULATORY_CARE_PROVIDER_SITE_OTHER): Payer: Medicare Other | Admitting: Family Medicine

## 2021-07-02 VITALS — BP 144/75 | HR 59 | Temp 97.5°F | Ht 61.0 in | Wt 197.8 lb

## 2021-07-02 DIAGNOSIS — M47817 Spondylosis without myelopathy or radiculopathy, lumbosacral region: Secondary | ICD-10-CM

## 2021-07-02 DIAGNOSIS — G8929 Other chronic pain: Secondary | ICD-10-CM | POA: Diagnosis not present

## 2021-07-02 DIAGNOSIS — E034 Atrophy of thyroid (acquired): Secondary | ICD-10-CM

## 2021-07-02 DIAGNOSIS — I129 Hypertensive chronic kidney disease with stage 1 through stage 4 chronic kidney disease, or unspecified chronic kidney disease: Secondary | ICD-10-CM | POA: Diagnosis not present

## 2021-07-02 DIAGNOSIS — E7800 Pure hypercholesterolemia, unspecified: Secondary | ICD-10-CM

## 2021-07-02 DIAGNOSIS — N1831 Hypertensive chronic kidney disease with stage 1 through stage 4 chronic kidney disease, or unspecified chronic kidney disease: Secondary | ICD-10-CM

## 2021-07-02 DIAGNOSIS — Z79899 Other long term (current) drug therapy: Secondary | ICD-10-CM

## 2021-07-02 DIAGNOSIS — R739 Hyperglycemia, unspecified: Secondary | ICD-10-CM | POA: Diagnosis not present

## 2021-07-02 DIAGNOSIS — M25551 Pain in right hip: Secondary | ICD-10-CM

## 2021-07-02 LAB — BAYER DCA HB A1C WAIVED: HB A1C (BAYER DCA - WAIVED): 5 % (ref 4.8–5.6)

## 2021-07-02 MED ORDER — HYDROCODONE-ACETAMINOPHEN 5-325 MG PO TABS: 1.0000 | ORAL_TABLET | Freq: Every day | ORAL | 0 refills | Status: DC | PRN

## 2021-07-02 NOTE — Progress Notes (Signed)
? ?Subjective: ?CC: Follow-up hypothyroidism ?PCP: Janora Norlander, DO ?Selena Hunt is a 71 y.o. female presenting to clinic today for: ? ?1.  Hypothyroidism ?Patient is compliant with Synthroid 100 mcg daily.  No reports of tremor, change in bowel habits or difficulty swallowing.  She does report that she had some heart palpitations following use of Dramamine.  She is asking that we add that to her allergy list. ? ?2.  Hypertension with hyperlipidemia, morbid obesity ?Patient is compliant with triamterene-hydrochlorothiazide, Pravachol.  She is very interested in pursuing injection therapy for weight loss.  No history of pancreatitis.  No personal or family history of medullary thyroid cancer or multiple endocrine type II neoplasia.  She has modified her lifestyle quite a bit but unfortunately is not successful in weight loss.  Exercise limited secondary to chronic dyspnea on exertion after COVID-19.  Still requiring O2 at bedtime ? ? ?ROS: Per HPI ? ?Allergies  ?Allergen Reactions  ? Ambien [Zolpidem Tartrate] Anxiety  ? Diovan [Valsartan] Other (See Comments)  ?  Seizures.  ? Wellbutrin [Bupropion] Other (See Comments)  ?  Seizures.  ? Codeine   ?  Feel crazy and dizzy   ? Feldene [Piroxicam]   ?  Swelling   ? Guaifenesin Er   ?  Raise Bp / HR   ? Latex   ?  Swelling   ? Mobic [Meloxicam] Other (See Comments)  ?  Stomach cramps  ? Neurontin [Gabapentin]   ?  "loopy"  ? Crestor [Rosuvastatin Calcium] Other (See Comments)  ?  Myalgias ?  ? Lipitor [Atorvastatin Calcium] Other (See Comments)  ?  myalgias  ? Livalo [Pitavastatin] Swelling and Other (See Comments)  ?  stomach cramping ?  ? Simcor [Niacin-Simvastatin Er] Other (See Comments)  ?  Stomach cramps  ? Zocor [Simvastatin] Other (See Comments)  ?  Stomach cramps  ? ?Past Medical History:  ?Diagnosis Date  ? Benign neoplasm of colon   ? Blood transfusion without reported diagnosis   ? x 34 to date  ? Chronic kidney disease   ? Colon polyps   ?  Diverticulitis of colon (without mention of hemorrhage)(562.11)   ? Diverticulosis of colon (without mention of hemorrhage)   ? Essential hypertension, benign   ? Female stress incontinence   ? GERD (gastroesophageal reflux disease)   ? Hematuria   ? Hernia of unspecified site of abdominal cavity without mention of obstruction or gangrene   ? History of closed head injury 04/21/2018  ? Kidney stones   ? has had one   ? Lumbosacral spondylosis without myelopathy   ? Lymphedema   ? Meniere disease   ? Drs are uncertain of this DX   ? Migraine headache   ? Neuromuscular disorder ([place])   ? Obesity   ? Other and unspecified hyperlipidemia   ? Pneumonia   ? Pulmonary embolism ([place])   ? Seizures ([place])   ? HX of --- from medications,2003  ? Stricture and stenosis of esophagus   ? Thyroid disease   ? Ventral hernia   ? ? ?Current Outpatient Medications:  ?  albuterol (PROVENTIL) (2.5 MG/3ML) 0.083% nebulizer solution, Take 3 mLs (2.5 mg total) by nebulization every 6 (six) hours as needed for wheezing or shortness of breath., Disp: 150 mL, Rfl: 1 ?  albuterol (VENTOLIN HFA) 108 (90 Base) MCG/ACT inhaler, INHALE TWO PUFFS BY MOUTH EVERY 6 HOURS AS NEEDED FOR WHEEZING OR SHORTNESS OF BREATH, Disp: 8.5 g, Rfl: 0 ?  aspirin EC 81 MG tablet, Take 81 mg by mouth daily., Disp: , Rfl:  ?  calcium-vitamin D (OSCAL WITH D) 500-200 MG-UNIT tablet, Take 1 tablet by mouth daily with breakfast., Disp: 90 tablet, Rfl: 1 ?  esomeprazole (NEXIUM) 40 MG capsule, Take 1 capsule (40 mg total) by mouth 2 (two) times daily before a meal., Disp: 60 capsule, Rfl: 3 ?  fluticasone (FLONASE) 50 MCG/ACT nasal spray, Place 2 sprays into both nostrils daily., Disp: 16 g, Rfl: 6 ?  levothyroxine (SYNTHROID) 100 MCG tablet, TAKE 1 TABLET BY MOUTH ON MONDAY AND FRIDAY, AND TAKE 1/2 TABLET ALL THE OTHER DAYS  (NEEDS TO BE SEEN BEFORE NEXT REFILL), Disp: 60 tablet, Rfl: 0 ?  meclizine (ANTIVERT) 25 MG tablet, Take 1 tablet (25 mg total) by mouth every 8  (eight) hours as needed for dizziness., Disp: 30 tablet, Rfl: 2 ?  metoprolol tartrate (LOPRESSOR) 50 MG tablet, TAKE 1/2 TABLET BY MOUTH DAILY, Disp: 45 tablet, Rfl: 1 ?  Multiple Vitamins-Minerals (WOMENS 50+ ADVANCED PO), Take by mouth., Disp: , Rfl:  ?  oxybutynin (DITROPAN-XL) 10 MG 24 hr tablet, Take 1 tablet (10 mg total) by mouth at bedtime. (NEEDS TO BE SEEN BEFORE NEXT REFILL), Disp: 30 tablet, Rfl: 0 ?  pravastatin (PRAVACHOL) 20 MG tablet, TAKE 1 TABLET BY MOUTH EVERY DAY, Disp: 90 tablet, Rfl: 1 ?  SYMBICORT 80-4.5 MCG/ACT inhaler, INHALE TWO PUFFS BY MOUTH TWICE DAILY (RINSE MOUTH AFTER EACH USE), Disp: 10.2 g, Rfl: 0 ?  triamterene-hydrochlorothiazide (MAXZIDE-25) 37.5-25 MG tablet, Take 0.5 tablets by mouth daily. (NEEDS TO BE SEEN BEFORE NEXT REFILL), Disp: 15 tablet, Rfl: 0 ?  Vitamin D, Ergocalciferol, (DRISDOL) 1.25 MG (50000 UNIT) CAPS capsule, TAKE ONE CAPSULE BY MOUTH EVERY SEVEN DAYS, Disp: 12 capsule, Rfl: 3 ?  HYDROcodone-acetaminophen (NORCO/VICODIN) 5-325 MG tablet, Take 1 tablet by mouth daily as needed for moderate pain or severe pain. (Patient not taking: Reported on 07/02/2021), Disp: 30 tablet, Rfl: 0 ?Social History  ? ?Socioeconomic History  ? Marital status: Widowed  ?  Spouse name: Bryli Mantey  ? Number of children: 2  ? Years of education: Not on file  ? Highest education level: 12th grade  ?Occupational History  ? Occupation: Retired  ?  Employer: [employer]  ?  Comment: 2005   ?Tobacco Use  ? Smoking status: Never  ? Smokeless tobacco: Never  ?Vaping Use  ? Vaping Use: Never used  ?Substance and Sexual Activity  ? Alcohol use: Not Currently  ?  Alcohol/week: 0.0 standard drinks  ?  Comment: wine once or twice a year  ? Drug use: No  ? Sexual activity: Never  ?  Birth control/protection: Post-menopausal  ?Other Topics Concern  ? Not on file  ?Social History Narrative  ? Lives alone.   ? Caffeine 2 cups daily   ? Right handed  ? ?Social Determinants of Health   ? ?Financial Resource Strain: Low Risk   ? Difficulty of Paying Living Expenses: Not hard at all  ?Food Insecurity: No Food Insecurity  ? Worried About Charity fundraiser in the Last Year: Never true  ? Ran Out of Food in the Last Year: Never true  ?Transportation Needs: No Transportation Needs  ? Lack of Transportation (Medical): No  ? Lack of Transportation (Non-Medical): No  ?Physical Activity: Insufficiently Active  ? Days of Exercise per Week: 7 days  ? Minutes of Exercise per Session: 10 min  ?Stress: No Stress Concern  Present  ? Feeling of Stress : Not at all  ?Social Connections: Socially Integrated  ? Frequency of Communication with Friends and Family: Three times a week  ? Frequency of Social Gatherings with Friends and Family: More than three times a week  ? Attends Religious Services: More than 4 times per year  ? Active Member of Clubs or Organizations: Yes  ? Attends Archivist Meetings: Never  ? Marital Status: Married  ?Intimate Partner Violence: Not on file  ? ?Family History  ?Problem Relation Age of Onset  ? Alzheimer's disease Mother   ? Hypertension Mother   ? Stroke Mother   ? Alzheimer's disease Father   ? Hypertension Father   ? Gout Brother   ? Fibromyalgia Daughter   ? Hypertension Son   ? Depression Son   ? Spondylitis Son   ?     spondylosis  ? GER disease Son   ? Heart disease Maternal Grandmother   ? Heart disease Maternal Grandfather   ? Congestive Heart Failure Maternal Grandfather   ? Stroke Paternal Grandmother   ? Hypertension Paternal Grandmother   ? Depression Paternal Grandfather   ? Suicidality Paternal Grandfather   ? Colon cancer Neg Hx   ? Esophageal cancer Neg Hx   ? Pancreatic cancer Neg Hx   ? Stomach cancer Neg Hx   ? Liver disease Neg Hx   ? ? ?Objective: ?Office vital signs reviewed. ?BP (!) 144/75   Pulse (!) 59   Temp (!) 97.5 ?F (36.4 ?C)   Ht '5\' 1"'$  (1.549 m)   Wt 197 lb 12.8 oz (89.7 kg)   SpO2 94%   BMI 37.37 kg/m?  ? ?Physical Examination:   ?General: Awake, alert, well nourished, No acute distress ?HEENT: Sclera white.  Moist mucous membranes.  No exophthalmos or goiter ?Cardio: slightly bradycardic with regular rhythm.  S1S2 heard, no murmurs appreciated ?Pulm: clear

## 2021-07-03 ENCOUNTER — Other Ambulatory Visit: Payer: Self-pay | Admitting: Family Medicine

## 2021-07-03 DIAGNOSIS — E034 Atrophy of thyroid (acquired): Secondary | ICD-10-CM

## 2021-07-03 DIAGNOSIS — E7800 Pure hypercholesterolemia, unspecified: Secondary | ICD-10-CM

## 2021-07-03 LAB — HEPATIC FUNCTION PANEL
ALT: 25 IU/L (ref 0–32)
AST: 26 IU/L (ref 0–40)
Alkaline Phosphatase: 92 IU/L (ref 44–121)
Bilirubin Total: 0.6 mg/dL (ref 0.0–1.2)
Bilirubin, Direct: 0.15 mg/dL (ref 0.00–0.40)
Total Protein: 6.2 g/dL (ref 6.0–8.5)

## 2021-07-03 LAB — LIPID PANEL
Chol/HDL Ratio: 3.2 ratio (ref 0.0–4.4)
Cholesterol, Total: 184 mg/dL (ref 100–199)
HDL: 57 mg/dL (ref >39)
LDL Chol Calc (NIH): 108 mg/dL — ABNORMAL HIGH (ref 0–99)
Triglycerides: 103 mg/dL (ref 0–149)
VLDL Cholesterol Cal: 19 mg/dL (ref 5–40)

## 2021-07-03 LAB — CBC
Hematocrit: 40.9 % (ref 34.0–46.6)
Hemoglobin: 14.1 g/dL (ref 11.1–15.9)
MCH: 28.6 pg (ref 26.6–33.0)
MCHC: 34.5 g/dL (ref 31.5–35.7)
MCV: 83 fL (ref 79–97)
Platelets: 232 10*3/uL (ref 150–450)
RBC: 4.93 x10E6/uL (ref 3.77–5.28)
RDW: 13.3 % (ref 11.7–15.4)
WBC: 5.2 10*3/uL (ref 3.4–10.8)

## 2021-07-03 LAB — RENAL FUNCTION PANEL
Albumin: 4.4 g/dL (ref 3.7–4.7)
BUN/Creatinine Ratio: 17 (ref 12–28)
BUN: 17 mg/dL (ref 8–27)
CO2: 26 mmol/L (ref 20–29)
Calcium: 9.9 mg/dL (ref 8.7–10.3)
Chloride: 102 mmol/L (ref 96–106)
Creatinine, Ser: 1 mg/dL (ref 0.57–1.00)
Glucose: 94 mg/dL (ref 70–99)
Phosphorus: 3.1 mg/dL (ref 3.0–4.3)
Potassium: 4.2 mmol/L (ref 3.5–5.2)
Sodium: 141 mmol/L (ref 134–144)
eGFR: 60 mL/min/{1.73_m2} (ref >59)

## 2021-07-03 LAB — VITAMIN D 25 HYDROXY (VIT D DEFICIENCY, FRACTURES): Vit D, 25-Hydroxy: 88.2 ng/mL (ref 30.0–100.0)

## 2021-07-03 LAB — TSH: TSH: 2.52 u[IU]/mL (ref 0.450–4.500)

## 2021-07-03 LAB — T4, FREE: Free T4: 1.46 ng/dL (ref 0.82–1.77)

## 2021-07-03 MED ORDER — PRAVASTATIN SODIUM 40 MG PO TABS: 40.0000 mg | ORAL_TABLET | Freq: Every day | ORAL | 3 refills | Status: DC

## 2021-07-03 MED ORDER — LEVOTHYROXINE SODIUM 100 MCG PO TABS: ORAL_TABLET | ORAL | 99 refills | Status: DC

## 2021-07-05 ENCOUNTER — Telehealth: Payer: Self-pay | Admitting: Family Medicine

## 2021-07-05 NOTE — Telephone Encounter (Signed)
She is not a good candidate for stimulant medications or other medications orally because of her chronic kidney disease and other medical issues.  The GLP class is recommended for this patient specifically ?

## 2021-07-06 LAB — TOXASSURE SELECT 13 (MW), URINE

## 2021-07-18 DIAGNOSIS — J1282 Pneumonia due to coronavirus disease 2019: Secondary | ICD-10-CM | POA: Diagnosis not present

## 2021-07-18 DIAGNOSIS — U071 COVID-19: Secondary | ICD-10-CM | POA: Diagnosis not present

## 2021-07-19 NOTE — Telephone Encounter (Signed)
There are no alternatives to these medications that she is eligible for since she is a nondiabetic.  Unfortunately Medicare simply does not cover this class of medication at this time.  Glad to refer to healthy weight and wellness if she would like to see a weight loss doctor but again I am not sure that this would make medications and easier to obtain ?

## 2021-07-19 NOTE — Telephone Encounter (Signed)
VM from [facility] customer service on behave of the member for [place] to send exemption information for pt's [facility] & [facility] to [facility] Fax # [phone] or [phone] ?

## 2021-07-19 NOTE — Telephone Encounter (Signed)
Pt aware of recommendations

## 2021-07-25 ENCOUNTER — Other Ambulatory Visit: Payer: Self-pay | Admitting: Family Medicine

## 2021-07-25 DIAGNOSIS — R600 Localized edema: Secondary | ICD-10-CM

## 2021-07-30 ENCOUNTER — Ambulatory Visit (INDEPENDENT_AMBULATORY_CARE_PROVIDER_SITE_OTHER): Payer: Medicare Other

## 2021-07-30 VITALS — Ht 61.0 in | Wt 194.0 lb

## 2021-07-30 DIAGNOSIS — Z0000 Encounter for general adult medical examination without abnormal findings: Secondary | ICD-10-CM | POA: Diagnosis not present

## 2021-07-30 NOTE — Progress Notes (Signed)
? ?Subjective:  ? Selena Hunt is a 71 y.o. female who presents for Medicare Annual (Subsequent) preventive examination. ?Virtual Visit via Telephone Note ? ?I connected with  Selena Hunt on 07/30/21 at  2:45 PM EDT by telephone and verified that I am speaking with the correct person using two identifiers. ? ?Location: ?Patient: HOME ?Provider: [facility] ?Persons participating in the virtual visit: patient/Nurse Health Advisor ?  ?I discussed the limitations, risks, security and privacy concerns of performing an evaluation and management service by telephone and the availability of in person appointments. The patient expressed understanding and agreed to proceed. ? ?Interactive audio and video telecommunications were attempted between this nurse and patient, however failed, due to patient having technical difficulties OR patient did not have access to video capability.  We continued and completed visit with audio only. ? ?Some vital signs may be absent or patient reported.  ? ?Selena Driver, LPN ? ?Review of Systems    ? ?Cardiac Risk Factors include: advanced age (>4mn, >>70women);dyslipidemia;hypertension;sedentary lifestyle;obesity (BMI >30kg/m2) ? ?   ?Objective:  ?  ?Today's Vitals  ? 07/30/21 1450  ?Weight: 194 lb (88 kg)  ?Height: '5\' 1"'$  (1.549 m)  ? ?Body mass index is 36.66 kg/m?. ? ? ?  07/30/2021  ?  3:02 PM 07/28/2020  ?  2:35 PM 10/05/2019  ?  2:43 PM 05/31/2019  ?  8:48 AM 03/31/2019  ?  6:04 PM 03/31/2019  ?  5:14 PM 03/31/2019  ?  9:57 AM  ?Advanced Directives  ?Does Patient Have a Medical Advance Directive? Yes Yes No Yes Yes Yes No  ?Type of [place]  [place];Out of facility DNR (pink MOST or yellow form) Living will Living will   ?Does patient want to make changes to medical advance directive?     No - Patient declined    ?Copy of [place]? No -  copy requested No - copy requested       ?Would patient like information on creating a medical advance directive?   No - Patient declined      ? ? ?Current Medications (verified) ?Outpatient Encounter Medications as of 07/30/2021  ?Medication Sig  ? albuterol (PROVENTIL) (2.5 MG/3ML) 0.083% nebulizer solution Take 3 mLs (2.5 mg total) by nebulization every 6 (six) hours as needed for wheezing or shortness of breath.  ? albuterol (VENTOLIN HFA) 108 (90 Base) MCG/ACT inhaler INHALE TWO PUFFS BY MOUTH EVERY 6 HOURS AS NEEDED FOR WHEEZING OR SHORTNESS OF BREATH  ? aspirin EC 81 MG tablet Take 81 mg by mouth daily.  ? calcium-vitamin D (OSCAL WITH D) 500-200 MG-UNIT tablet Take 1 tablet by mouth daily with breakfast.  ? esomeprazole (NEXIUM) 40 MG capsule Take 1 capsule (40 mg total) by mouth 2 (two) times daily before a meal.  ? fluticasone (FLONASE) 50 MCG/ACT nasal spray Place 2 sprays into both nostrils daily.  ? HYDROcodone-acetaminophen (NORCO/VICODIN) 5-325 MG tablet Take 1 tablet by mouth daily as needed for moderate pain or severe pain.  ? levothyroxine (SYNTHROID) 100 MCG tablet TAKE 1 TABLET BY MOUTH ON MONDAY AND FRIDAY, AND TAKE 1/2 TABLET ALL THE OTHER DAYS  ? meclizine (ANTIVERT) 25 MG tablet Take 1 tablet (25 mg total) by mouth every 8 (eight) hours as needed for dizziness.  ? metoprolol tartrate (LOPRESSOR) 50 MG tablet TAKE 1/2 TABLET BY MOUTH DAILY  ?  Multiple Vitamins-Minerals (WOMENS 50+ ADVANCED PO) Take by mouth.  ? oxybutynin (DITROPAN-XL) 10 MG 24 hr tablet TAKE 1 TABLET BY MOUTH AT BEDTIME  ? pravastatin (PRAVACHOL) 40 MG tablet Take 1 tablet (40 mg total) by mouth daily.  ? SYMBICORT 80-4.5 MCG/ACT inhaler INHALE TWO PUFFS BY MOUTH TWICE DAILY (RINSE MOUTH AFTER EACH USE)  ? triamterene-hydrochlorothiazide (MAXZIDE-25) 37.5-25 MG tablet Take 0.5 tablets by mouth daily.  ? Vitamin D, Ergocalciferol, (DRISDOL) 1.25 MG (50000 UNIT) CAPS capsule TAKE ONE CAPSULE BY MOUTH EVERY SEVEN DAYS  ? ?No  facility-administered encounter medications on file as of 07/30/2021.  ? ? ?Allergies (verified) ?Ambien [zolpidem tartrate], Diovan [valsartan], Wellbutrin [bupropion], Codeine, Feldene [piroxicam], Guaifenesin er, Latex, Mobic [meloxicam], Neurontin [gabapentin], Crestor [rosuvastatin calcium], Lipitor [atorvastatin calcium], Livalo [pitavastatin], Simcor [niacin-simvastatin er], and Zocor [simvastatin]  ? ?History: ?Past Medical History:  ?Diagnosis Date  ? Benign neoplasm of colon   ? Blood transfusion without reported diagnosis   ? x 34 to date  ? Chronic kidney disease   ? Colon polyps   ? Diverticulitis of colon (without mention of hemorrhage)(562.11)   ? Diverticulosis of colon (without mention of hemorrhage)   ? Elevated ferritin 05/03/2019  ? Essential hypertension, benign   ? Female stress incontinence   ? GERD (gastroesophageal reflux disease)   ? Hematuria   ? Hernia of unspecified site of abdominal cavity without mention of obstruction or gangrene   ? History of closed head injury 04/21/2018  ? Kidney stones   ? has had one   ? Lumbosacral spondylosis without myelopathy   ? Lymphedema   ? Meniere disease   ? Drs are uncertain of this DX   ? Migraine headache   ? Neuromuscular disorder ([place])   ? Obesity   ? Other and unspecified hyperlipidemia   ? Pneumonia   ? Pulmonary embolism ([place])   ? Seizures ([place])   ? HX of --- from medications,2003  ? Stricture and stenosis of esophagus   ? Thyroid disease   ? Ventral hernia   ? Vertigo 12/12/2018  ? ?Past Surgical History:  ?Procedure Laterality Date  ? ABDOMINAL HYSTERECTOMY  1990  ? APPENDECTOMY    ? COLON SURGERY  1997  ? Removed  ? FEMORAL VARUS OSTEOTOMY W/ ADDUCTOR RELEASE AND ILIAC CREST BONE GRAFT, PELVIC OSTEOTOMY    ? FEMUR FRACTURE SURGERY Left 2003  ? HERNIA REPAIR    ? with mesh, abdominal  ? HIP FRACTURE SURGERY Left   ? HUMERUS FRACTURE SURGERY Left   ? [place]  ? Fusion L4 - L5  ? PARTIAL COLECTOMY    ? Secondary to diverticulitis  ?  TENDON TRANSFER    ? TUBAL LIGATION  1972  ? ULNAR NERVE REPAIR Left   ? ?Family History  ?Problem Relation Age of Onset  ? Alzheimer's disease Mother   ? Hypertension Mother   ? Stroke Mother   ? Alzheimer's disease Father   ? Hypertension Father   ? Gout Brother   ? Fibromyalgia Daughter   ? Hypertension Son   ? Depression Son   ? Spondylitis Son   ?     spondylosis  ? GER disease Son   ? Heart disease Maternal Grandmother   ? Heart disease Maternal Grandfather   ? Congestive Heart Failure Maternal Grandfather   ? Stroke Paternal Grandmother   ? Hypertension Paternal Grandmother   ? Depression Paternal Grandfather   ? Suicidality Paternal Grandfather   ? Colon  cancer Neg Hx   ? Esophageal cancer Neg Hx   ? Pancreatic cancer Neg Hx   ? Stomach cancer Neg Hx   ? Liver disease Neg Hx   ? ?Social History  ? ?Socioeconomic History  ? Marital status: Widowed  ?  Spouse name: Sherryann Frese  ? Number of children: 2  ? Years of education: Not on file  ? Highest education level: 12th grade  ?Occupational History  ? Occupation: Retired  ?  Employer: [employer]  ?  Comment: 2005   ?Tobacco Use  ? Smoking status: Never  ? Smokeless tobacco: Never  ?Vaping Use  ? Vaping Use: Never used  ?Substance and Sexual Activity  ? Alcohol use: Not Currently  ?  Alcohol/week: 0.0 standard drinks  ?  Comment: wine once or twice a year  ? Drug use: No  ? Sexual activity: Never  ?  Birth control/protection: Post-menopausal  ?Other Topics Concern  ? Not on file  ?Social History Narrative  ? Lives alone.   ? Caffeine 2 cups daily   ? Right handed  ? ?Social Determinants of Health  ? ?Financial Resource Strain: Low Risk   ? Difficulty of Paying Living Expenses: Not very hard  ?Food Insecurity: No Food Insecurity  ? Worried About Charity fundraiser in the Last Year: Never true  ? Ran Out of Food in the Last Year: Never true  ?Transportation Needs: No Transportation Needs  ? Lack of Transportation (Medical): No  ? Lack of Transportation  (Non-Medical): No  ?Physical Activity: Insufficiently Active  ? Days of Exercise per Week: 2 days  ? Minutes of Exercise per Session: 20 min  ?Stress: No Stress Concern Present  ? Feeling of Stress : Not at all

## 2021-07-30 NOTE — Patient Instructions (Signed)
Selena Hunt , ?Thank you for taking time to come for your Medicare Wellness Visit. I appreciate your ongoing commitment to your health goals. Please review the following plan we discussed and let me know if I can assist you in the future.  ? ?Screening recommendations/referrals: ?Colonoscopy: Done 02/04/2017 Repeat in 5 years ? ?Mammogram: Done 11/03/2020 Repeat annually ? ?Bone Density: Done 01/09/2021 Repeat every 2 years ? ?Recommended yearly ophthalmology/optometry visit for glaucoma screening and checkup ?Recommended yearly dental visit for hygiene and checkup ? ?Vaccinations: ?Influenza vaccine: Declined. ?Pneumococcal vaccine: Done 01/14/2014 and 03/06/2015 ?Tdap vaccine: Done 01/07/2011 Repeat in 10 years ? ?Shingles vaccine: Discussed.   ?Covid-19:Done 07/09/2019, 08/10/2019, 04/21/2020 ? ?Advanced directives: Please bring a copy of your health care power of attorney and living will to the office to be added to your chart at your convenience. ? ? ?Conditions/risks identified: Aim for 30 minutes of exercise or brisk walking, 6-8 glasses of water, and 5 servings of fruits and vegetables each day. ? ? ?Next appointment: Follow up in one year for your annual wellness visit 2024. ? ? ?Preventive Care 32 Years and Older, Female ?Preventive care refers to lifestyle choices and visits with your health care provider that can promote health and wellness. ?What does preventive care include? ?A yearly physical exam. This is also called an annual well check. ?Dental exams once or twice a year. ?Routine eye exams. Ask your health care provider how often you should have your eyes checked. ?Personal lifestyle choices, including: ?Daily care of your teeth and gums. ?Regular physical activity. ?Eating a healthy diet. ?Avoiding tobacco and drug use. ?Limiting alcohol use. ?Practicing safe sex. ?Taking low-dose aspirin every day. ?Taking vitamin and mineral supplements as recommended by your health care provider. ?What happens during an  annual well check? ?The services and screenings done by your health care provider during your annual well check will depend on your age, overall health, lifestyle risk factors, and family history of disease. ?Counseling  ?Your health care provider may ask you questions about your: ?Alcohol use. ?Tobacco use. ?Drug use. ?Emotional well-being. ?Home and relationship well-being. ?Sexual activity. ?Eating habits. ?History of falls. ?Memory and ability to understand (cognition). ?Work and work Statistician. ?Reproductive health. ?Screening  ?You may have the following tests or measurements: ?Height, weight, and BMI. ?Blood pressure. ?Lipid and cholesterol levels. These may be checked every 5 years, or more frequently if you are over 74 years old. ?Skin check. ?Lung cancer screening. You may have this screening every year starting at age 25 if you have a 30-pack-year history of smoking and currently smoke or have quit within the past 15 years. ?Fecal occult blood test (FOBT) of the stool. You may have this test every year starting at age 45. ?Flexible sigmoidoscopy or colonoscopy. You may have a sigmoidoscopy every 5 years or a colonoscopy every 10 years starting at age 76. ?Hepatitis C blood test. ?Hepatitis B blood test. ?Sexually transmitted disease (STD) testing. ?Diabetes screening. This is done by checking your blood sugar (glucose) after you have not eaten for a while (fasting). You may have this done every 1-3 years. ?Bone density scan. This is done to screen for osteoporosis. You may have this done starting at age 97. ?Mammogram. This may be done every 1-2 years. Talk to your health care provider about how often you should have regular mammograms. ?Talk with your health care provider about your test results, treatment options, and if necessary, the need for more tests. ?Vaccines  ?Your health  care provider may recommend certain vaccines, such as: ?Influenza vaccine. This is recommended every year. ?Tetanus,  diphtheria, and acellular pertussis (Tdap, Td) vaccine. You may need a Td booster every 10 years. ?Zoster vaccine. You may need this after age 67. ?Pneumococcal 13-valent conjugate (PCV13) vaccine. One dose is recommended after age 74. ?Pneumococcal polysaccharide (PPSV23) vaccine. One dose is recommended after age 19. ?Talk to your health care provider about which screenings and vaccines you need and how often you need them. ?This information is not intended to replace advice given to you by your health care provider. Make sure you discuss any questions you have with your health care provider. ?Document Released: 04/21/2015 Document Revised: 12/13/2015 Document Reviewed: 01/24/2015 ?Elsevier Interactive Patient Education ? 2017 [place]. ? ?Fall Prevention in the Home ?Falls can cause injuries. They can happen to people of all ages. There are many things you can do to make your home safe and to help prevent falls. ?What can I do on the outside of my home? ?Regularly fix the edges of walkways and driveways and fix any cracks. ?Remove anything that might make you trip as you walk through a door, such as a raised step or threshold. ?Trim any bushes or trees on the path to your home. ?Use bright outdoor lighting. ?Clear any walking paths of anything that might make someone trip, such as rocks or tools. ?Regularly check to see if handrails are loose or broken. Make sure that both sides of any steps have handrails. ?Any raised decks and porches should have guardrails on the edges. ?Have any leaves, snow, or ice cleared regularly. ?Use sand or salt on walking paths during winter. ?Clean up any spills in your garage right away. This includes oil or grease spills. ?What can I do in the bathroom? ?Use night lights. ?Install grab bars by the toilet and in the tub and shower. Do not use towel bars as grab bars. ?Use non-skid mats or decals in the tub or shower. ?If you need to sit down in the shower, use a plastic,  non-slip stool. ?Keep the floor dry. Clean up any water that spills on the floor as soon as it happens. ?Remove soap buildup in the tub or shower regularly. ?Attach bath mats securely with double-sided non-slip rug tape. ?Do not have throw rugs and other things on the floor that can make you trip. ?What can I do in the bedroom? ?Use night lights. ?Make sure that you have a light by your bed that is easy to reach. ?Do not use any sheets or blankets that are too big for your bed. They should not hang down onto the floor. ?Have a firm chair that has side arms. You can use this for support while you get dressed. ?Do not have throw rugs and other things on the floor that can make you trip. ?What can I do in the kitchen? ?Clean up any spills right away. ?Avoid walking on wet floors. ?Keep items that you use a lot in easy-to-reach places. ?If you need to reach something above you, use a strong step stool that has a grab bar. ?Keep electrical cords out of the way. ?Do not use floor polish or wax that makes floors slippery. If you must use wax, use non-skid floor wax. ?Do not have throw rugs and other things on the floor that can make you trip. ?What can I do with my stairs? ?Do not leave any items on the stairs. ?Make sure that there are handrails on  both sides of the stairs and use them. Fix handrails that are broken or loose. Make sure that handrails are as long as the stairways. ?Check any carpeting to make sure that it is firmly attached to the stairs. Fix any carpet that is loose or worn. ?Avoid having throw rugs at the top or bottom of the stairs. If you do have throw rugs, attach them to the floor with carpet tape. ?Make sure that you have a light switch at the top of the stairs and the bottom of the stairs. If you do not have them, ask someone to add them for you. ?What else can I do to help prevent falls? ?Wear shoes that: ?Do not have high heels. ?Have rubber bottoms. ?Are comfortable and fit you well. ?Are closed  at the toe. Do not wear sandals. ?If you use a stepladder: ?Make sure that it is fully opened. Do not climb a closed stepladder. ?Make sure that both sides of the stepladder are locked into place. ?Ask someone t

## 2021-08-17 DIAGNOSIS — J1282 Pneumonia due to coronavirus disease 2019: Secondary | ICD-10-CM | POA: Diagnosis not present

## 2021-08-17 DIAGNOSIS — U071 COVID-19: Secondary | ICD-10-CM | POA: Diagnosis not present

## 2021-08-23 ENCOUNTER — Other Ambulatory Visit: Payer: Self-pay | Admitting: Family Medicine

## 2021-08-23 DIAGNOSIS — I10 Essential (primary) hypertension: Secondary | ICD-10-CM

## 2021-09-17 DIAGNOSIS — J1282 Pneumonia due to coronavirus disease 2019: Secondary | ICD-10-CM | POA: Diagnosis not present

## 2021-09-17 DIAGNOSIS — U071 COVID-19: Secondary | ICD-10-CM | POA: Diagnosis not present

## 2021-09-24 ENCOUNTER — Telehealth: Payer: Self-pay | Admitting: Family Medicine

## 2021-10-02 ENCOUNTER — Other Ambulatory Visit: Payer: Self-pay | Admitting: Family Medicine

## 2021-10-02 DIAGNOSIS — R0602 Shortness of breath: Secondary | ICD-10-CM

## 2021-10-10 DIAGNOSIS — H903 Sensorineural hearing loss, bilateral: Secondary | ICD-10-CM | POA: Diagnosis not present

## 2021-10-10 DIAGNOSIS — Z45321 Encounter for adjustment and management of cochlear device: Secondary | ICD-10-CM | POA: Diagnosis not present

## 2021-10-10 DIAGNOSIS — H918X1 Other specified hearing loss, right ear: Secondary | ICD-10-CM | POA: Diagnosis not present

## 2021-10-10 DIAGNOSIS — Z9981 Dependence on supplemental oxygen: Secondary | ICD-10-CM | POA: Diagnosis not present

## 2021-10-10 DIAGNOSIS — H8101 Meniere's disease, right ear: Secondary | ICD-10-CM | POA: Diagnosis not present

## 2021-10-10 DIAGNOSIS — U099 Post covid-19 condition, unspecified: Secondary | ICD-10-CM | POA: Diagnosis not present

## 2021-10-10 DIAGNOSIS — G8929 Other chronic pain: Secondary | ICD-10-CM | POA: Diagnosis not present

## 2021-10-10 DIAGNOSIS — K219 Gastro-esophageal reflux disease without esophagitis: Secondary | ICD-10-CM | POA: Diagnosis not present

## 2021-10-10 DIAGNOSIS — Z9621 Cochlear implant status: Secondary | ICD-10-CM | POA: Diagnosis not present

## 2021-10-10 DIAGNOSIS — Z9281 Personal history of extracorporeal membrane oxygenation (ECMO): Secondary | ICD-10-CM | POA: Diagnosis not present

## 2021-10-10 DIAGNOSIS — I10 Essential (primary) hypertension: Secondary | ICD-10-CM | POA: Diagnosis not present

## 2021-10-10 DIAGNOSIS — H8111 Benign paroxysmal vertigo, right ear: Secondary | ICD-10-CM | POA: Diagnosis not present

## 2021-10-10 DIAGNOSIS — R0602 Shortness of breath: Secondary | ICD-10-CM | POA: Diagnosis not present

## 2021-10-10 DIAGNOSIS — Z79899 Other long term (current) drug therapy: Secondary | ICD-10-CM | POA: Diagnosis not present

## 2021-10-11 ENCOUNTER — Other Ambulatory Visit: Payer: Self-pay | Admitting: Family Medicine

## 2021-10-11 DIAGNOSIS — G8929 Other chronic pain: Secondary | ICD-10-CM | POA: Diagnosis not present

## 2021-10-11 DIAGNOSIS — Z9981 Dependence on supplemental oxygen: Secondary | ICD-10-CM | POA: Diagnosis not present

## 2021-10-11 DIAGNOSIS — J209 Acute bronchitis, unspecified: Secondary | ICD-10-CM

## 2021-10-11 DIAGNOSIS — H8111 Benign paroxysmal vertigo, right ear: Secondary | ICD-10-CM | POA: Diagnosis not present

## 2021-10-11 DIAGNOSIS — Z9281 Personal history of extracorporeal membrane oxygenation (ECMO): Secondary | ICD-10-CM | POA: Diagnosis not present

## 2021-10-11 DIAGNOSIS — Z79899 Other long term (current) drug therapy: Secondary | ICD-10-CM | POA: Diagnosis not present

## 2021-10-11 DIAGNOSIS — Z9621 Cochlear implant status: Secondary | ICD-10-CM | POA: Diagnosis not present

## 2021-10-11 DIAGNOSIS — U099 Post covid-19 condition, unspecified: Secondary | ICD-10-CM | POA: Diagnosis not present

## 2021-10-11 DIAGNOSIS — R0602 Shortness of breath: Secondary | ICD-10-CM | POA: Diagnosis not present

## 2021-10-11 DIAGNOSIS — H903 Sensorineural hearing loss, bilateral: Secondary | ICD-10-CM | POA: Diagnosis not present

## 2021-10-11 DIAGNOSIS — I10 Essential (primary) hypertension: Secondary | ICD-10-CM | POA: Diagnosis not present

## 2021-10-11 DIAGNOSIS — H8101 Meniere's disease, right ear: Secondary | ICD-10-CM | POA: Diagnosis not present

## 2021-10-11 DIAGNOSIS — H918X1 Other specified hearing loss, right ear: Secondary | ICD-10-CM | POA: Diagnosis not present

## 2021-10-11 DIAGNOSIS — K219 Gastro-esophageal reflux disease without esophagitis: Secondary | ICD-10-CM | POA: Diagnosis not present

## 2021-10-15 ENCOUNTER — Telehealth: Payer: Self-pay

## 2021-10-15 NOTE — Telephone Encounter (Signed)
PT AWARE READY FOR PICKUP

## 2021-10-15 NOTE — Telephone Encounter (Signed)
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

## 2021-10-15 NOTE — Telephone Encounter (Signed)
Pt's daughter cam in to see [facility] today. She wanted to speak to [facility], but she is on vacation.  Daughter states that the pt is requesting 2 handicap placards. Her current ones are about to expire.

## 2021-10-17 DIAGNOSIS — U071 COVID-19: Secondary | ICD-10-CM | POA: Diagnosis not present

## 2021-10-17 DIAGNOSIS — J1282 Pneumonia due to coronavirus disease 2019: Secondary | ICD-10-CM | POA: Diagnosis not present

## 2021-10-18 DIAGNOSIS — Z48810 Encounter for surgical aftercare following surgery on the sense organs: Secondary | ICD-10-CM | POA: Diagnosis not present

## 2021-10-18 DIAGNOSIS — Z9621 Cochlear implant status: Secondary | ICD-10-CM | POA: Diagnosis not present

## 2021-10-18 DIAGNOSIS — H903 Sensorineural hearing loss, bilateral: Secondary | ICD-10-CM | POA: Diagnosis not present

## 2021-10-23 DIAGNOSIS — H903 Sensorineural hearing loss, bilateral: Secondary | ICD-10-CM | POA: Diagnosis not present

## 2021-11-06 DIAGNOSIS — Z1231 Encounter for screening mammogram for malignant neoplasm of breast: Secondary | ICD-10-CM | POA: Diagnosis not present

## 2021-11-08 DIAGNOSIS — Z9621 Cochlear implant status: Secondary | ICD-10-CM | POA: Diagnosis not present

## 2021-11-08 DIAGNOSIS — H903 Sensorineural hearing loss, bilateral: Secondary | ICD-10-CM | POA: Diagnosis not present

## 2021-11-08 DIAGNOSIS — Z09 Encounter for follow-up examination after completed treatment for conditions other than malignant neoplasm: Secondary | ICD-10-CM | POA: Diagnosis not present

## 2021-11-17 DIAGNOSIS — J1282 Pneumonia due to coronavirus disease 2019: Secondary | ICD-10-CM | POA: Diagnosis not present

## 2021-11-17 DIAGNOSIS — U071 COVID-19: Secondary | ICD-10-CM | POA: Diagnosis not present

## 2021-11-28 DIAGNOSIS — Z45321 Encounter for adjustment and management of cochlear device: Secondary | ICD-10-CM | POA: Diagnosis not present

## 2021-12-08 IMAGING — DX DG CHEST 2V
2 series · 2 of 2 positions shown · non-contrast
Comparison: Multiple prior exams most recent radiograph 06/04/2019

CLINICAL DATA: Cough and shortness of breath.

EXAM:
CHEST - 2 VIEW

[chest lat]
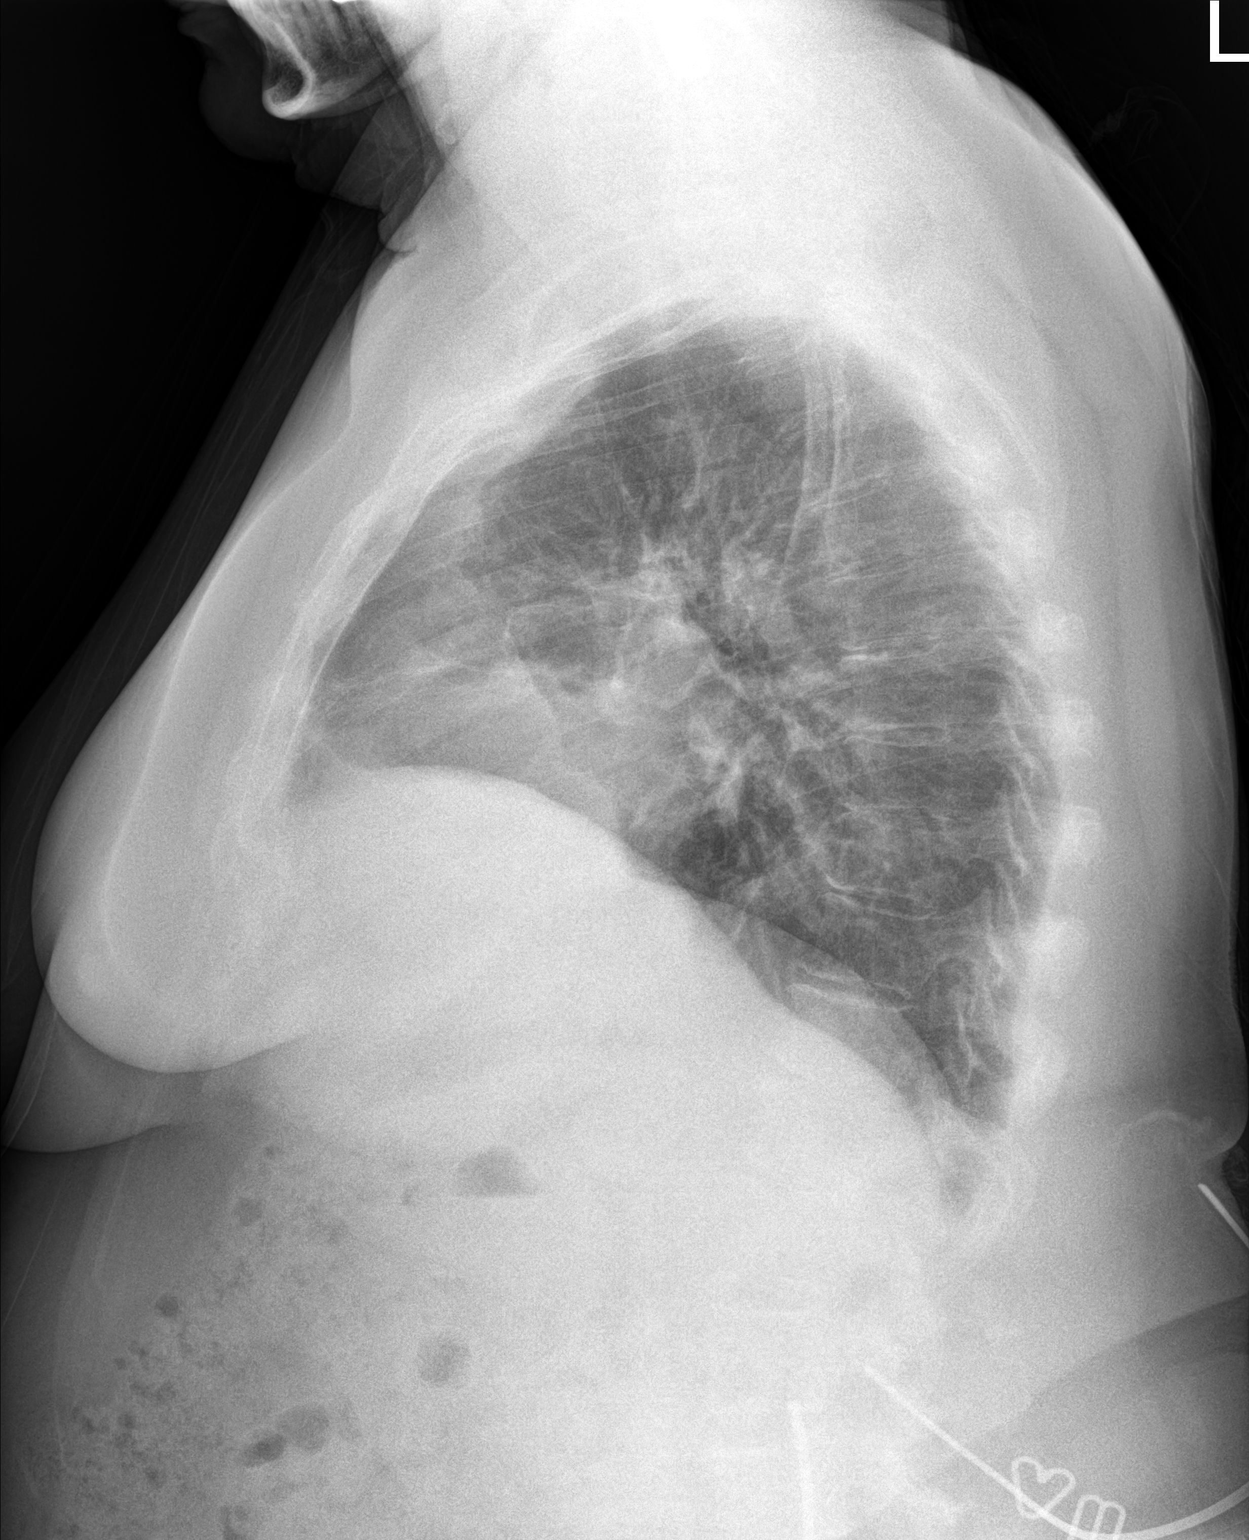

[chest pa]
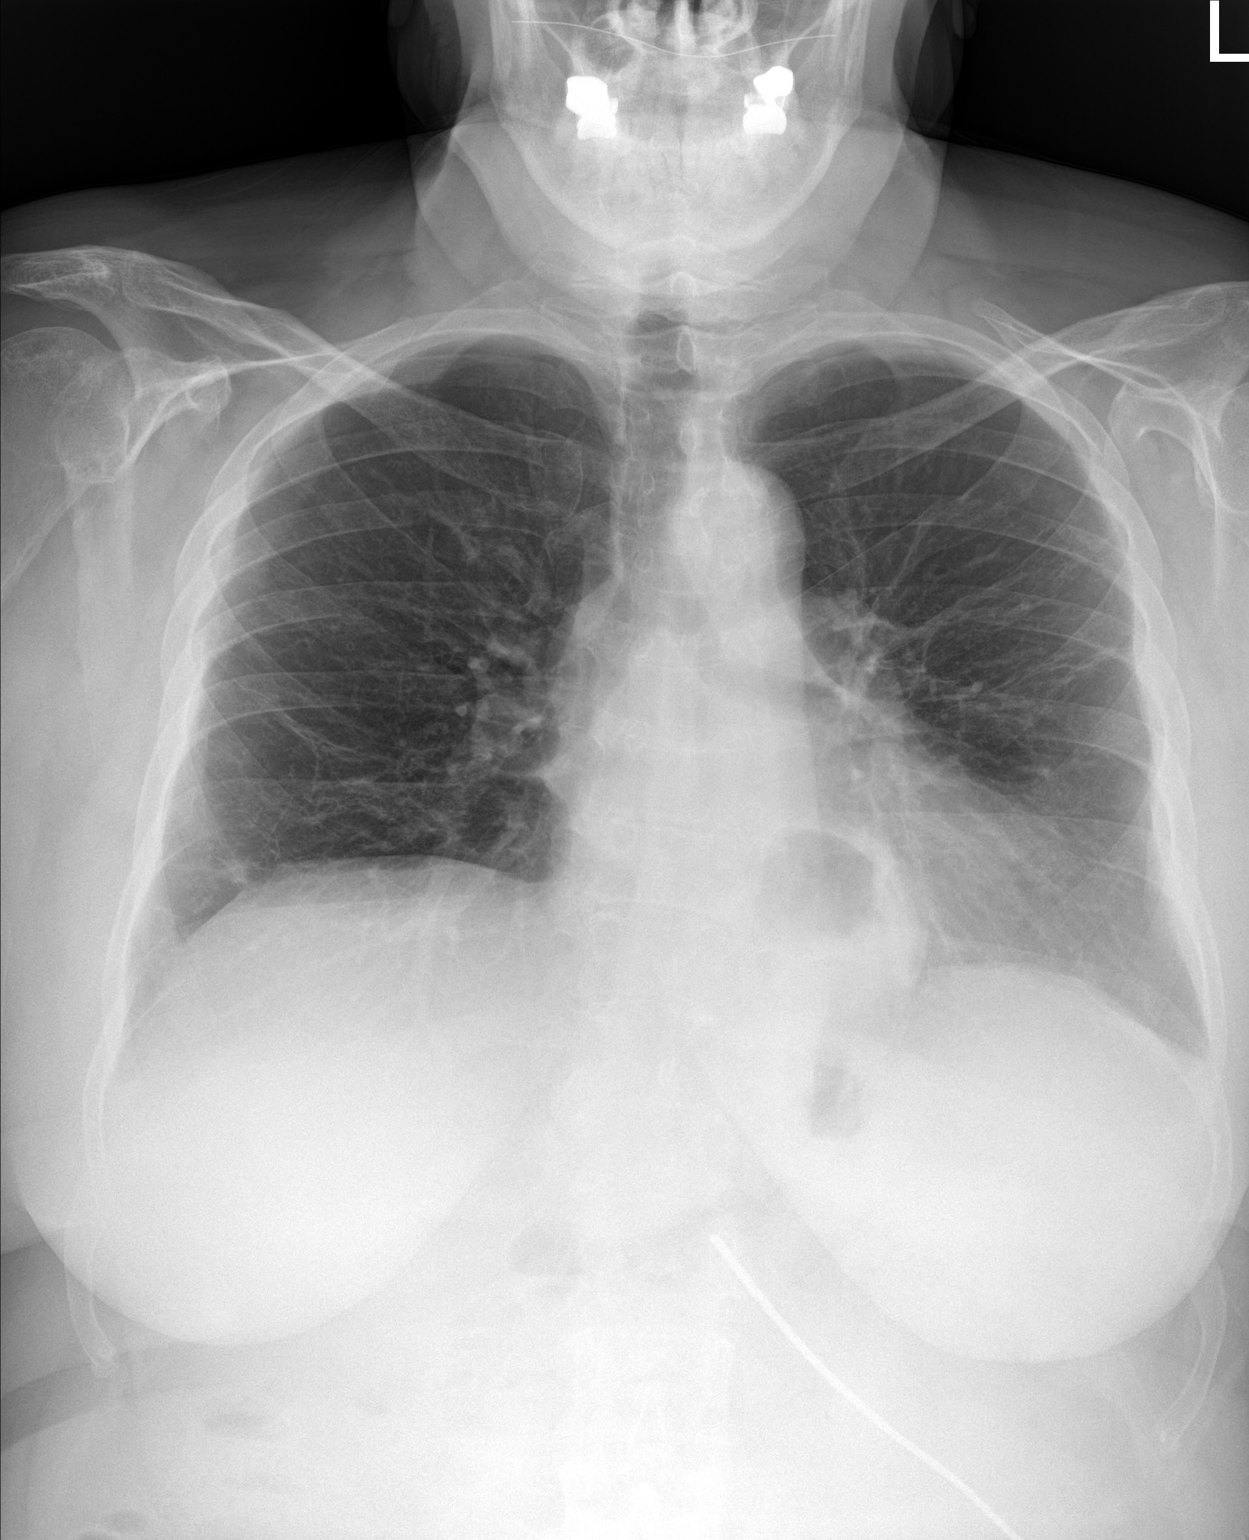

[2 of 2 positions shown; findings below may reference images not displayed]

FINDINGS: Retrocardiac hiatal herniathe cardiomediastinal contours are normal.
Subsegmental scarring in the left mid lung and right lung base.
Pulmonary vasculature is normal. No consolidation, pleural effusion,
or pneumothorax. No acute osseous abnormalities are seen.
IMPRESSION: 1. No acute chest findings.  Mild bilateral lung scarring.
2. Retrocardiac hiatal hernia.

## 2021-12-18 DIAGNOSIS — U071 COVID-19: Secondary | ICD-10-CM | POA: Diagnosis not present

## 2021-12-18 DIAGNOSIS — J1282 Pneumonia due to coronavirus disease 2019: Secondary | ICD-10-CM | POA: Diagnosis not present

## 2021-12-19 DIAGNOSIS — H903 Sensorineural hearing loss, bilateral: Secondary | ICD-10-CM | POA: Diagnosis not present

## 2021-12-19 DIAGNOSIS — Z45321 Encounter for adjustment and management of cochlear device: Secondary | ICD-10-CM | POA: Diagnosis not present

## 2021-12-24 DIAGNOSIS — H04123 Dry eye syndrome of bilateral lacrimal glands: Secondary | ICD-10-CM | POA: Diagnosis not present

## 2022-01-11 DIAGNOSIS — Z886 Allergy status to analgesic agent status: Secondary | ICD-10-CM | POA: Diagnosis not present

## 2022-01-11 DIAGNOSIS — Z888 Allergy status to other drugs, medicaments and biological substances status: Secondary | ICD-10-CM | POA: Diagnosis not present

## 2022-01-11 DIAGNOSIS — Z881 Allergy status to other antibiotic agents status: Secondary | ICD-10-CM | POA: Diagnosis not present

## 2022-01-11 DIAGNOSIS — Z974 Presence of external hearing-aid: Secondary | ICD-10-CM | POA: Diagnosis not present

## 2022-01-11 DIAGNOSIS — Z91040 Latex allergy status: Secondary | ICD-10-CM | POA: Diagnosis not present

## 2022-01-11 DIAGNOSIS — Z45321 Encounter for adjustment and management of cochlear device: Secondary | ICD-10-CM | POA: Diagnosis not present

## 2022-01-11 DIAGNOSIS — Z9621 Cochlear implant status: Secondary | ICD-10-CM | POA: Diagnosis not present

## 2022-01-11 DIAGNOSIS — H903 Sensorineural hearing loss, bilateral: Secondary | ICD-10-CM | POA: Diagnosis not present

## 2022-01-11 DIAGNOSIS — H8101 Meniere's disease, right ear: Secondary | ICD-10-CM | POA: Diagnosis not present

## 2022-01-11 DIAGNOSIS — Z885 Allergy status to narcotic agent status: Secondary | ICD-10-CM | POA: Diagnosis not present

## 2022-01-17 DIAGNOSIS — U071 COVID-19: Secondary | ICD-10-CM | POA: Diagnosis not present

## 2022-01-17 DIAGNOSIS — J1282 Pneumonia due to coronavirus disease 2019: Secondary | ICD-10-CM | POA: Diagnosis not present

## 2022-01-20 ENCOUNTER — Other Ambulatory Visit: Payer: Self-pay | Admitting: Family Medicine

## 2022-01-20 DIAGNOSIS — E559 Vitamin D deficiency, unspecified: Secondary | ICD-10-CM

## 2022-01-20 DIAGNOSIS — R600 Localized edema: Secondary | ICD-10-CM

## 2022-01-23 ENCOUNTER — Ambulatory Visit (INDEPENDENT_AMBULATORY_CARE_PROVIDER_SITE_OTHER): Payer: Medicare Other | Admitting: Family Medicine

## 2022-01-23 ENCOUNTER — Encounter: Payer: Self-pay | Admitting: Family Medicine

## 2022-01-23 VITALS — BP 135/78 | HR 62 | Temp 97.8°F | Ht 61.0 in | Wt 202.8 lb

## 2022-01-23 DIAGNOSIS — E7800 Pure hypercholesterolemia, unspecified: Secondary | ICD-10-CM | POA: Diagnosis not present

## 2022-01-23 DIAGNOSIS — E034 Atrophy of thyroid (acquired): Secondary | ICD-10-CM

## 2022-01-23 DIAGNOSIS — I129 Hypertensive chronic kidney disease with stage 1 through stage 4 chronic kidney disease, or unspecified chronic kidney disease: Secondary | ICD-10-CM | POA: Diagnosis not present

## 2022-01-23 DIAGNOSIS — Z23 Encounter for immunization: Secondary | ICD-10-CM

## 2022-01-23 DIAGNOSIS — M25551 Pain in right hip: Secondary | ICD-10-CM

## 2022-01-23 DIAGNOSIS — G8929 Other chronic pain: Secondary | ICD-10-CM | POA: Diagnosis not present

## 2022-01-23 DIAGNOSIS — N1831 Chronic kidney disease, stage 3a: Secondary | ICD-10-CM

## 2022-01-23 DIAGNOSIS — M47817 Spondylosis without myelopathy or radiculopathy, lumbosacral region: Secondary | ICD-10-CM

## 2022-01-23 MED ORDER — PRAVASTATIN SODIUM 20 MG PO TABS: 20.0000 mg | ORAL_TABLET | Freq: Every day | ORAL | 3 refills | Status: DC

## 2022-01-23 MED ORDER — EZETIMIBE 10 MG PO TABS: 10.0000 mg | ORAL_TABLET | Freq: Every day | ORAL | 3 refills | Status: DC

## 2022-01-23 MED ORDER — HYDROCODONE-ACETAMINOPHEN 5-325 MG PO TABS: 1.0000 | ORAL_TABLET | Freq: Every day | ORAL | 0 refills | Status: DC | PRN

## 2022-01-23 NOTE — Progress Notes (Signed)
Subjective: CC: Follow-up hypothyroidism, CKD, hyperlipidemia PCP: Janora Norlander, DO JSH:FWYO Selena Hunt is a 71 y.o. female presenting to clinic today for:  1.  Hypothyroidism Compliant with Synthroid.  No reports of tremor, heart palpitations.  Reports ongoing difficulty losing weight.  See below  2.  Hypertension, hyperlipidemia, CKD associated with morbid obesity Patient continues have difficulty losing weight but she identified a source of sugar that she did not realize she was consuming in her creamer.  She has since switched off of that.  She also drinks one 7 ounce soda per day.  She admits that she does not workout like she did prior to COVID-19 and has not returned to [employer] because of fear for that infection.  No chest pain, shortness of breath.  When she tried to take pravastatin 40 mg it caused abdominal pain.  She used it for a month before discontinuing.   ROS: Per HPI  Allergies  Allergen Reactions   Ambien [Zolpidem Tartrate] Anxiety   Diovan [Valsartan] Other (See Comments)    Seizures.   Wellbutrin [Bupropion] Other (See Comments)    Seizures.   Codeine     Feel crazy and dizzy    Feldene [Piroxicam]     Swelling    Guaifenesin Er     Raise Bp / HR    Latex     Swelling    Mobic [Meloxicam] Other (See Comments)    Stomach cramps   Neurontin [Gabapentin]     "loopy"   Crestor [Rosuvastatin Calcium] Other (See Comments)    Myalgias    Lipitor [Atorvastatin Calcium] Other (See Comments)    myalgias   Livalo [Pitavastatin] Swelling and Other (See Comments)    stomach cramping    Simcor [Niacin-Simvastatin Er] Other (See Comments)    Stomach cramps   Zocor [Simvastatin] Other (See Comments)    Stomach cramps   Past Medical History:  Diagnosis Date   Benign neoplasm of colon    Blood transfusion without reported diagnosis    x 34 to date   Chronic kidney disease    Colon polyps    Diverticulitis of colon (without mention of  hemorrhage)(562.11)    Diverticulosis of colon (without mention of hemorrhage)    Elevated ferritin 05/03/2019   Essential hypertension, benign    Female stress incontinence    GERD (gastroesophageal reflux disease)    Hematuria    Hernia of unspecified site of abdominal cavity without mention of obstruction or gangrene    History of closed head injury 04/21/2018   Kidney stones    has had one    Lumbosacral spondylosis without myelopathy    Lymphedema    Meniere disease    Drs are uncertain of this DX    Migraine headache    Neuromuscular disorder ([place])    Obesity    Other and unspecified hyperlipidemia    Pneumonia    Pulmonary embolism (HCC)    Seizures ([place])    HX of --- from medications,2003   Stricture and stenosis of esophagus    Thyroid disease    Ventral hernia    Vertigo 12/12/2018    Current Outpatient Medications:    albuterol (PROVENTIL) (2.5 MG/3ML) 0.083% nebulizer solution, Take 3 mLs (2.5 mg total) by nebulization every 6 (six) hours as needed for wheezing or shortness of breath., Disp: 150 mL, Rfl: 1   albuterol (VENTOLIN HFA) 108 (90 Base) MCG/ACT inhaler, INHALE TWO PUFFS BY MOUTH EVERY 6 HOURS AS NEEDED  FOR WHEEZING OR SHORTNESS OF BREATH, Disp: 8.5 g, Rfl: 2   aspirin EC 81 MG tablet, Take 81 mg by mouth daily., Disp: , Rfl:    calcium-vitamin D (OSCAL WITH D) 500-200 MG-UNIT tablet, Take 1 tablet by mouth daily with breakfast., Disp: 90 tablet, Rfl: 1   esomeprazole (NEXIUM) 40 MG capsule, Take 1 capsule (40 mg total) by mouth 2 (two) times daily before a meal., Disp: 60 capsule, Rfl: 3   fluticasone (FLONASE) 50 MCG/ACT nasal spray, Place 2 sprays into both nostrils daily., Disp: 16 g, Rfl: 6   HYDROcodone-acetaminophen (NORCO/VICODIN) 5-325 MG tablet, Take 1 tablet by mouth daily as needed for moderate pain or severe pain., Disp: 30 tablet, Rfl: 0   levothyroxine (SYNTHROID) 100 MCG tablet, TAKE 1 TABLET BY MOUTH ON MONDAY AND FRIDAY, AND TAKE 1/2 TABLET ALL  THE OTHER DAYS, Disp: 90 tablet, Rfl: PRN   meclizine (ANTIVERT) 25 MG tablet, Take 1 tablet (25 mg total) by mouth every 8 (eight) hours as needed for dizziness., Disp: 30 tablet, Rfl: 2   metoprolol tartrate (LOPRESSOR) 50 MG tablet, TAKE 1/2 TABLET BY MOUTH DAILY, Disp: 45 tablet, Rfl: 1   Multiple Vitamins-Minerals (WOMENS 50+ ADVANCED PO), Take by mouth., Disp: , Rfl:    oxybutynin (DITROPAN-XL) 10 MG 24 hr tablet, TAKE 1 TABLET BY MOUTH AT BEDTIME, Disp: 30 tablet, Rfl: 0   pravastatin (PRAVACHOL) 40 MG tablet, Take 1 tablet (40 mg total) by mouth daily., Disp: 90 tablet, Rfl: 3   SYMBICORT 80-4.5 MCG/ACT inhaler, INHALE TWO PUFFS BY MOUTH TWICE DAILY (RINSE MOUTH AFTER EACH USE), Disp: 10.2 g, Rfl: 0   triamterene-hydrochlorothiazide (MAXZIDE-25) 37.5-25 MG tablet, TAKE 1/2 TABLET BY MOUTH EVERY DAY, Disp: 15 tablet, Rfl: 0   Vitamin D, Ergocalciferol, (DRISDOL) 1.25 MG (50000 UNIT) CAPS capsule, TAKE ONE CAPSULE BY MOUTH EVERY SEVEN DAYS, Disp: 12 capsule, Rfl: 3 Social History   Socioeconomic History   Marital status: Widowed    Spouse name: Brinda Focht   Number of children: 2   Years of education: Not on file   Highest education level: 12th grade  Occupational History   Occupation: Retired    Fish farm manager: Engineer, [employer]    Comment: 2005   Tobacco Use   Smoking status: Never   Smokeless tobacco: Never  Vaping Use   Vaping Use: Never used  Substance and Sexual Activity   Alcohol use: Not Currently    Alcohol/week: 0.0 standard drinks of alcohol    Comment: wine once or twice a year   Drug use: No   Sexual activity: Never    Birth control/protection: Post-menopausal  Other Topics Concern   Not on file  Social History Narrative   Lives alone.    Caffeine 2 cups daily    Right handed   Social Determinants of Health   Financial Resource Strain: Low Risk  (07/30/2021)   Overall Financial Resource Strain (CARDIA)    Difficulty of Paying Living Expenses: Not very hard   Food Insecurity: No Food Insecurity (07/30/2021)   Hunger Vital Sign    Worried About Running Out of Food in the Last Year: Never true    Ran Out of Food in the Last Year: Never true  Transportation Needs: No Transportation Needs (07/30/2021)   PRAPARE - Hydrologist (Medical): No    Lack of Transportation (Non-Medical): No  Physical Activity: Insufficiently Active (07/30/2021)   Exercise Vital Sign    Days of Exercise  per Week: 2 days    Minutes of Exercise per Session: 20 min  Stress: No Stress Concern Present (07/30/2021)   [place]    Feeling of Stress : Not at all  Social Connections: Moderately Integrated (07/30/2021)   Social Connection and Isolation Panel [NHANES]    Frequency of Communication with Friends and Family: More than three times a week    Frequency of Social Gatherings with Friends and Family: More than three times a week    Attends Religious Services: More than 4 times per year    Active Member of [employer] or Organizations: Yes    Attends Archivist Meetings: More than 4 times per year    Marital Status: Widowed  Intimate Partner Violence: Not At Risk (07/30/2021)   Humiliation, Afraid, Rape, and Kick questionnaire    Fear of Current or Ex-Partner: No    Emotionally Abused: No    Physically Abused: No    Sexually Abused: No   Family History  Problem Relation Age of Onset   Alzheimer's disease Mother    Hypertension Mother    Stroke Mother    Alzheimer's disease Father    Hypertension Father    Gout Brother    Fibromyalgia Daughter    Hypertension Son    Depression Son    Spondylitis Son        spondylosis   GER disease Son    Heart disease Maternal Grandmother    Heart disease Maternal Grandfather    Congestive Heart Failure Maternal Grandfather    Stroke Paternal Grandmother    Hypertension Paternal Grandmother    Depression Paternal Grandfather     Suicidality Paternal Grandfather    Colon cancer Neg Hx    Esophageal cancer Neg Hx    Pancreatic cancer Neg Hx    Stomach cancer Neg Hx    Liver disease Neg Hx     Objective: Office vital signs reviewed. BP 135/78   Pulse 62   Temp 97.8 F (36.6 C)   Ht '5\' 1"'  (1.549 m)   Wt 202 lb 12.8 oz (92 kg)   SpO2 94%   BMI 38.32 kg/m   Physical Examination:  General: Awake, alert, morbidly obese, No acute distress HEENT: Sclera white.  No exophthalmos.  No goiter Cardio: regular rate and rhythm, S1S2 heard, no murmurs appreciated Pulm: clear to auscultation bilaterally, no wheezes, rhonchi or rales; normal work of breathing on room air MSK: Ambulating independently Neuro: no tremor. Has contracture of left hand  Assessment/ Plan: 71 y.o. 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  Check thyroid levels  Note training diet given.  Discussed GLP but her insurance will not cover.  Add Zetia to the Pravachol 20 mg since did not tolerate higher doses of Pravachol and has been intolerant to multiple statins.  ASCVD risk score is over 19%  Norco renewed for as needed use.  Uses sparingly.  Up-to-date on UDS and CSC.  National narcotic database  reviewed and there were no red flags.  Had 1 fill of tramadol status post procedure which was appropriate  No orders of the defined types were placed in this  encounter.  No orders of the defined types were placed in this encounter.    Janora Norlander, DO [place] [phone]

## 2022-01-24 LAB — CMP14+EGFR
ALT: 18 IU/L (ref 0–32)
AST: 23 IU/L (ref 0–40)
Albumin/Globulin Ratio: 2.4 — ABNORMAL HIGH (ref 1.2–2.2)
Albumin: 4.3 g/dL (ref 3.8–4.8)
Alkaline Phosphatase: 110 IU/L (ref 44–121)
BUN/Creatinine Ratio: 14 (ref 12–28)
BUN: 14 mg/dL (ref 8–27)
Bilirubin Total: 0.3 mg/dL (ref 0.0–1.2)
CO2: 24 mmol/L (ref 20–29)
Calcium: 9.4 mg/dL (ref 8.7–10.3)
Chloride: 105 mmol/L (ref 96–106)
Creatinine, Ser: 0.99 mg/dL (ref 0.57–1.00)
Globulin, Total: 1.8 g/dL (ref 1.5–4.5)
Glucose: 88 mg/dL (ref 70–99)
Potassium: 4.1 mmol/L (ref 3.5–5.2)
Sodium: 147 mmol/L — ABNORMAL HIGH (ref 134–144)
Total Protein: 6.1 g/dL (ref 6.0–8.5)
eGFR: 60 mL/min/{1.73_m2} (ref >59)

## 2022-01-24 LAB — LIPID PANEL
Chol/HDL Ratio: 3.3 ratio (ref 0.0–4.4)
Cholesterol, Total: 200 mg/dL — ABNORMAL HIGH (ref 100–199)
HDL: 60 mg/dL (ref >39)
LDL Chol Calc (NIH): 117 mg/dL — ABNORMAL HIGH (ref 0–99)
Triglycerides: 132 mg/dL (ref 0–149)
VLDL Cholesterol Cal: 23 mg/dL (ref 5–40)

## 2022-01-24 LAB — TSH: TSH: 1.86 u[IU]/mL (ref 0.450–4.500)

## 2022-01-24 LAB — T4, FREE: Free T4: 1.28 ng/dL (ref 0.82–1.77)

## 2022-02-04 ENCOUNTER — Ambulatory Visit (INDEPENDENT_AMBULATORY_CARE_PROVIDER_SITE_OTHER): Payer: Medicare Other | Admitting: Family Medicine

## 2022-02-04 ENCOUNTER — Encounter: Payer: Self-pay | Admitting: Family Medicine

## 2022-02-04 DIAGNOSIS — J40 Bronchitis, not specified as acute or chronic: Secondary | ICD-10-CM | POA: Diagnosis not present

## 2022-02-04 MED ORDER — BENZONATATE 200 MG PO CAPS: 200.0000 mg | ORAL_CAPSULE | Freq: Two times a day (BID) | ORAL | 3 refills | Status: DC | PRN

## 2022-02-04 MED ORDER — FLUCONAZOLE 150 MG PO TABS: 150.0000 mg | ORAL_TABLET | Freq: Once | ORAL | 0 refills | Status: AC

## 2022-02-04 MED ORDER — AMOXICILLIN-POT CLAVULANATE 875-125 MG PO TABS: 1.0000 | ORAL_TABLET | Freq: Two times a day (BID) | ORAL | 0 refills | Status: DC

## 2022-02-04 MED ORDER — PREDNISONE 20 MG PO TABS: ORAL_TABLET | ORAL | 0 refills | Status: DC

## 2022-02-04 NOTE — Progress Notes (Signed)
Virtual Visit via telephone Note  I connected with Selena Hunt on 02/04/22 at 1326 by telephone and verified that I am speaking with the correct person using two identifiers. Selena Hunt is currently located at home and patient are currently with her during visit. The provider, Fransisca Kaufmann Imane Burrough, MD is located in their office at time of visit.  Call ended at 1338  I discussed the limitations, risks, security and privacy concerns of performing an evaluation and management service by telephone and the availability of in person appointments. I also discussed with the patient that there may be a patient responsible charge related to this service. The patient expressed understanding and agreed to proceed.   History and Present Illness: Patient is calling in for cough and chest congestion for 5 days.  She feels slight SOB.  She has a non-productive cough but has a history of severe pneumonia.  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 She is on 2LO2 at night.  She does have symbicort and albuterol and nebulizer. She takes mucinex and coricidin.  She gets like this in the fall.   1. Bronchitis     Outpatient Encounter Medications as of 02/04/2022  Medication Sig   amoxicillin-clavulanate (AUGMENTIN) 875-125 MG tablet Take 1 tablet by mouth 2 (two) times daily.   benzonatate (TESSALON) 200 MG capsule Take 1 capsule (200 mg total) by mouth 2 (two) times daily as needed for cough.   fluconazole (DIFLUCAN) 150 MG tablet Take 1 tablet (150 mg total) by mouth once for 1 dose.   predniSONE (DELTASONE) 20 MG tablet 2 po at same time daily for 5 days   albuterol (PROVENTIL) (2.5 MG/3ML) 0.083% nebulizer solution Take 3 mLs (2.5 mg total) by nebulization every 6 (six) hours as needed for wheezing or shortness of breath.   albuterol (VENTOLIN HFA) 108 (90 Base) MCG/ACT inhaler INHALE TWO PUFFS BY MOUTH EVERY 6 HOURS AS NEEDED FOR WHEEZING OR SHORTNESS OF BREATH   aspirin EC 81 MG tablet Take 81 mg  by mouth daily.   calcium-vitamin D (OSCAL WITH D) 500-200 MG-UNIT tablet Take 1 tablet by mouth daily with breakfast.   esomeprazole (NEXIUM) 40 MG capsule Take 1 capsule (40 mg total) by mouth 2 (two) times daily before a meal.   ezetimibe (ZETIA) 10 MG tablet Take 1 tablet (10 mg total) by mouth daily.   fluticasone (FLONASE) 50 MCG/ACT nasal spray Place 2 sprays into both nostrils daily.   HYDROcodone-acetaminophen (NORCO/VICODIN) 5-325 MG tablet Take 1 tablet by mouth daily as needed for moderate pain or severe pain.   levothyroxine (SYNTHROID) 100 MCG tablet TAKE 1 TABLET BY MOUTH ON MONDAY AND FRIDAY, AND TAKE 1/2 TABLET ALL THE OTHER DAYS   meclizine (ANTIVERT) 25 MG tablet Take 1 tablet (25 mg total) by mouth every 8 (eight) hours as needed for dizziness.   metoprolol tartrate (LOPRESSOR) 50 MG tablet TAKE 1/2 TABLET BY MOUTH DAILY   Multiple Vitamins-Minerals (WOMENS 50+ ADVANCED PO) Take by mouth.   oxybutynin (DITROPAN-XL) 10 MG 24 hr tablet TAKE 1 TABLET BY MOUTH AT BEDTIME   pravastatin (PRAVACHOL) 20 MG tablet Take 1 tablet (20 mg total) by mouth daily. Could not tolerate '40mg'$    SYMBICORT 80-4.5 MCG/ACT inhaler INHALE TWO PUFFS BY MOUTH TWICE DAILY (RINSE MOUTH AFTER EACH USE)   triamterene-hydrochlorothiazide (MAXZIDE-25) 37.5-25 MG tablet TAKE 1/2 TABLET BY MOUTH EVERY DAY   Vitamin D, Ergocalciferol, (DRISDOL) 1.25 MG (50000 UNIT) CAPS capsule TAKE ONE  CAPSULE BY MOUTH EVERY SEVEN DAYS   No facility-administered encounter medications on file as of 02/04/2022.    Review of Systems  Constitutional:  Negative for chills and fever.  HENT:  Positive for postnasal drip, rhinorrhea and sinus pressure. Negative for congestion, ear discharge, ear pain, sneezing and sore throat.   Eyes:  Negative for pain, redness and visual disturbance.  Respiratory:  Positive for cough and shortness of breath. Negative for chest tightness and wheezing.   Cardiovascular:  Negative for chest pain and  leg swelling.  Genitourinary:  Negative for difficulty urinating and dysuria.  Musculoskeletal:  Negative for back pain and gait problem.  Skin:  Negative for rash.  Neurological:  Negative for light-headedness and headaches.  Psychiatric/Behavioral:  Negative for agitation and behavioral problems.   All other systems reviewed and are negative.   Observations/Objective: Patient sounds comfortable and in no acute distress  Assessment and Plan: Problem List Items Addressed This Visit   None Visit Diagnoses     Bronchitis    -  Primary   Relevant Medications   amoxicillin-clavulanate (AUGMENTIN) 875-125 MG tablet   benzonatate (TESSALON) 200 MG capsule   predniSONE (DELTASONE) 20 MG tablet   fluconazole (DIFLUCAN) 150 MG tablet     Patient has a history of severe pneumonias and severe bronchitis, will treat preemptively.  She will call back if she needs anything further but sent Augmentin Tessalon Perles and prednisone and a Diflucan just in case she needs it.  Follow up plan: Return if symptoms worsen or fail to improve.     I discussed the assessment and treatment plan with the patient. The patient was provided an opportunity to ask questions and all were answered. The patient agreed with the plan and demonstrated an understanding of the instructions.   The patient was advised to call back or seek an in-person evaluation if the symptoms worsen or if the condition fails to improve as anticipated.  The above assessment and management plan was discussed with the patient. The patient verbalized understanding of and has agreed to the management plan. Patient is aware to call the clinic if symptoms persist or worsen. Patient is aware when to return to the clinic for a follow-up visit. Patient educated on when it is appropriate to go to the emergency department.    I provided 12 minutes of non-face-to-face time during this encounter.    Worthy Rancher, MD

## 2022-02-17 DIAGNOSIS — J1282 Pneumonia due to coronavirus disease 2019: Secondary | ICD-10-CM | POA: Diagnosis not present

## 2022-02-17 DIAGNOSIS — U071 COVID-19: Secondary | ICD-10-CM | POA: Diagnosis not present

## 2022-02-18 ENCOUNTER — Other Ambulatory Visit: Payer: Self-pay | Admitting: Family Medicine

## 2022-02-18 DIAGNOSIS — R600 Localized edema: Secondary | ICD-10-CM

## 2022-02-18 DIAGNOSIS — I10 Essential (primary) hypertension: Secondary | ICD-10-CM

## 2022-02-25 DIAGNOSIS — H04123 Dry eye syndrome of bilateral lacrimal glands: Secondary | ICD-10-CM | POA: Diagnosis not present

## 2022-03-19 DIAGNOSIS — J1282 Pneumonia due to coronavirus disease 2019: Secondary | ICD-10-CM | POA: Diagnosis not present

## 2022-03-19 DIAGNOSIS — U071 COVID-19: Secondary | ICD-10-CM | POA: Diagnosis not present

## 2022-03-21 DIAGNOSIS — Z9621 Cochlear implant status: Secondary | ICD-10-CM | POA: Diagnosis not present

## 2022-03-21 DIAGNOSIS — H903 Sensorineural hearing loss, bilateral: Secondary | ICD-10-CM | POA: Diagnosis not present

## 2022-04-10 ENCOUNTER — Ambulatory Visit (INDEPENDENT_AMBULATORY_CARE_PROVIDER_SITE_OTHER): Payer: Medicare Other

## 2022-04-10 ENCOUNTER — Encounter: Payer: Self-pay | Admitting: Family Medicine

## 2022-04-10 ENCOUNTER — Ambulatory Visit (INDEPENDENT_AMBULATORY_CARE_PROVIDER_SITE_OTHER): Payer: Medicare Other | Admitting: Family Medicine

## 2022-04-10 ENCOUNTER — Encounter: Payer: Self-pay | Admitting: Nurse Practitioner

## 2022-04-10 ENCOUNTER — Encounter: Payer: Medicare Other | Admitting: Nurse Practitioner

## 2022-04-10 VITALS — BP 129/74 | HR 88 | Temp 100.7°F

## 2022-04-10 DIAGNOSIS — U071 COVID-19: Secondary | ICD-10-CM | POA: Diagnosis not present

## 2022-04-10 DIAGNOSIS — J40 Bronchitis, not specified as acute or chronic: Secondary | ICD-10-CM | POA: Diagnosis not present

## 2022-04-10 DIAGNOSIS — J189 Pneumonia, unspecified organism: Secondary | ICD-10-CM | POA: Diagnosis not present

## 2022-04-10 DIAGNOSIS — R918 Other nonspecific abnormal finding of lung field: Secondary | ICD-10-CM | POA: Diagnosis not present

## 2022-04-10 MED ORDER — BENZONATATE 200 MG PO CAPS: 200.0000 mg | ORAL_CAPSULE | Freq: Two times a day (BID) | ORAL | 0 refills | Status: DC | PRN

## 2022-04-10 MED ORDER — PREDNISONE 20 MG PO TABS: ORAL_TABLET | ORAL | 0 refills | Status: DC

## 2022-04-10 MED ORDER — FLUCONAZOLE 150 MG PO TABS: 150.0000 mg | ORAL_TABLET | Freq: Once | ORAL | 0 refills | Status: AC

## 2022-04-10 MED ORDER — AZITHROMYCIN 250 MG PO TABS: ORAL_TABLET | ORAL | 0 refills | Status: DC

## 2022-04-10 MED ORDER — MOLNUPIRAVIR EUA 200MG CAPSULE: 4.0000 | ORAL_CAPSULE | Freq: Two times a day (BID) | ORAL | 0 refills | Status: AC

## 2022-04-10 NOTE — Progress Notes (Signed)
Telephone visit  Subjective: BJ:YNWGN PCP: Janora Norlander, DO FAO:ZHYQ E Klassen is a 72 y.o. female calls for telephone consult today. Patient provides verbal consent for consult held via phone.  Due to COVID-19 pandemic this visit was conducted virtually. This visit type was conducted due to national recommendations for restrictions regarding the COVID-19 Pandemic (e.g. social distancing, sheltering in place) in an effort to limit this patient's exposure and mitigate transmission in our community. All issues noted in this document were discussed and addressed.  A physical exam was not performed with this format.   Location of patient: car Location of provider: [facility] Others present for call: daughter  1. COVID  Patient reports sinus congestion that onset 4 days ago. Then yesterday, she started having chills, aching.  Took a COVID test and it was positive.  She reports that she has had to use the O2 the whole time.  Fever to 100.83F. Having a productive cough with this yellow mucus.  She is using 3L O2 currently.  Drops into the 60s without it.    ROS: Per HPI  Allergies  Allergen Reactions   Ambien [Zolpidem Tartrate] Anxiety   Diovan [Valsartan] Other (See Comments)    Seizures.   Wellbutrin [Bupropion] Other (See Comments)    Seizures.   Codeine     Feel crazy and dizzy    Doxycycline Other (See Comments)    conjunctivitis   Feldene [Piroxicam]     Swelling    Guaifenesin Er     Raise Bp / HR    Latex     Swelling    Mobic [Meloxicam] Other (See Comments)    Stomach cramps   Neurontin [Gabapentin]     "loopy"   Crestor [Rosuvastatin Calcium] Other (See Comments)    Myalgias    Lipitor [Atorvastatin Calcium] Other (See Comments)    myalgias   Livalo [Pitavastatin] Swelling and Other (See Comments)    stomach cramping    Simcor [Niacin-Simvastatin Er] Other (See Comments)    Stomach cramps   Zocor [Simvastatin] Other (See Comments)    Stomach cramps   Past  Medical History:  Diagnosis Date   Benign neoplasm of colon    Blood transfusion without reported diagnosis    x 34 to date   Chronic kidney disease    Colon polyps    Diverticulitis of colon (without mention of hemorrhage)(562.11)    Diverticulosis of colon (without mention of hemorrhage)    Elevated ferritin 05/03/2019   Essential hypertension, benign    Female stress incontinence    GERD (gastroesophageal reflux disease)    Hematuria    Hernia of unspecified site of abdominal cavity without mention of obstruction or gangrene    History of closed head injury 04/21/2018   Kidney stones    has had one    Lumbosacral spondylosis without myelopathy    Lymphedema    Meniere disease    Drs are uncertain of this DX    Migraine headache    Neuromuscular disorder ([place])    Obesity    Other and unspecified hyperlipidemia    Pneumonia    Pulmonary embolism ([place])    Seizures ([place])    HX of --- from medications,2003   Stricture and stenosis of esophagus    Thyroid disease    Ventral hernia    Vertigo 12/12/2018    Current Outpatient Medications:    albuterol (PROVENTIL) (2.5 MG/3ML) 0.083% nebulizer solution, Take 3 mLs (2.5 mg total) by nebulization every  6 (six) hours as needed for wheezing or shortness of breath., Disp: 150 mL, Rfl: 1   albuterol (VENTOLIN HFA) 108 (90 Base) MCG/ACT inhaler, INHALE TWO PUFFS BY MOUTH EVERY 6 HOURS AS NEEDED FOR WHEEZING OR SHORTNESS OF BREATH, Disp: 8.5 g, Rfl: 2   amoxicillin-clavulanate (AUGMENTIN) 875-125 MG tablet, Take 1 tablet by mouth 2 (two) times daily., Disp: 20 tablet, Rfl: 0   aspirin EC 81 MG tablet, Take 81 mg by mouth daily., Disp: , Rfl:    benzonatate (TESSALON) 200 MG capsule, Take 1 capsule (200 mg total) by mouth 2 (two) times daily as needed for cough., Disp: 60 capsule, Rfl: 3   calcium-vitamin D (OSCAL WITH D) 500-200 MG-UNIT tablet, Take 1 tablet by mouth daily with breakfast., Disp: 90 tablet, Rfl: 1   esomeprazole (NEXIUM) 40  MG capsule, Take 1 capsule (40 mg total) by mouth 2 (two) times daily before a meal., Disp: 60 capsule, Rfl: 3   ezetimibe (ZETIA) 10 MG tablet, Take 1 tablet (10 mg total) by mouth daily., Disp: 90 tablet, Rfl: 3   fluticasone (FLONASE) 50 MCG/ACT nasal spray, Place 2 sprays into both nostrils daily., Disp: 16 g, Rfl: 6   HYDROcodone-acetaminophen (NORCO/VICODIN) 5-325 MG tablet, Take 1 tablet by mouth daily as needed for moderate pain or severe pain., Disp: 30 tablet, Rfl: 0   levothyroxine (SYNTHROID) 100 MCG tablet, TAKE 1 TABLET BY MOUTH ON MONDAY AND FRIDAY, AND TAKE 1/2 TABLET ALL THE OTHER DAYS, Disp: 90 tablet, Rfl: PRN   meclizine (ANTIVERT) 25 MG tablet, Take 1 tablet (25 mg total) by mouth every 8 (eight) hours as needed for dizziness., Disp: 30 tablet, Rfl: 2   metoprolol tartrate (LOPRESSOR) 50 MG tablet, TAKE 1/2 TABLET BY MOUTH DAILY, Disp: 45 tablet, Rfl: 0   Multiple Vitamins-Minerals (WOMENS 50+ ADVANCED PO), Take by mouth., Disp: , Rfl:    oxybutynin (DITROPAN-XL) 10 MG 24 hr tablet, TAKE 1 TABLET BY MOUTH AT BEDTIME, Disp: 30 tablet, Rfl: 2   pravastatin (PRAVACHOL) 20 MG tablet, Take 1 tablet (20 mg total) by mouth daily. Could not tolerate '40mg'$ , Disp: 90 tablet, Rfl: 3   predniSONE (DELTASONE) 20 MG tablet, 2 po at same time daily for 5 days, Disp: 10 tablet, Rfl: 0   SYMBICORT 80-4.5 MCG/ACT inhaler, INHALE TWO PUFFS BY MOUTH TWICE DAILY (RINSE MOUTH AFTER EACH USE), Disp: 10.2 g, Rfl: 0   triamterene-hydrochlorothiazide (MAXZIDE-25) 37.5-25 MG tablet, TAKE 1/2 TABLET BY MOUTH EVERY DAY, Disp: 15 tablet, Rfl: 2   Vitamin D, Ergocalciferol, (DRISDOL) 1.25 MG (50000 UNIT) CAPS capsule, TAKE ONE CAPSULE BY MOUTH EVERY SEVEN DAYS, Disp: 12 capsule, Rfl: 3     04/10/2022    9:58 AM 01/23/2022    8:03 AM 07/30/2021    2:50 PM  Vitals with BMI  Height  '5\' 1"'$  '5\' 1"'$   Weight  202 lbs 13 oz 194 lbs  BMI  40.10 27.25  Systolic 366 440   Diastolic 74 78   Pulse 88 62     DG Chest  2 View  Result Date: 04/10/2022 CLINICAL DATA:  COVID with increased oxygen requirement EXAM: CHEST - 2 VIEW COMPARISON:  Chest radiograph dated 08/28/2020 FINDINGS: Normal lung volumes. Lingular patchy opacity. Retrocardiac opacity with fluid levels, likely large hiatal hernia. No pleural effusion or pneumothorax. The heart size and mediastinal contours are within normal limits. The visualized skeletal structures are unremarkable. IMPRESSION: 1. Lingular patchy opacity may reflect atelectasis, aspiration, or pneumonia. 2. Retrocardiac opacity  with fluid levels, likely large hiatal hernia. Electronically Signed   By: Darrin Nipper M.D.   On: 04/10/2022 10:15    Assessment/ Plan: 72 y.o. female   COVID-19 - Plan: molnupiravir EUA (LAGEVRIO) 200 mg CAPS capsule, predniSONE (DELTASONE) 20 MG tablet, benzonatate (TESSALON) 200 MG capsule, DG Chest 2 View  Bronchitis - Plan: predniSONE (DELTASONE) 20 MG tablet, benzonatate (TESSALON) 200 MG capsule  Lingular pneumonia - Plan: azithromycin (ZITHROMAX) 250 MG tablet, fluconazole (DIFLUCAN) 150 MG tablet  COVID medication sent.  I am also covering her for what appears to be a lingular pneumonia with azithromycin.  Diflucan sent should she develop yeast vaginitis.  Proceed with prednisone, Tessalon Perles as needed.  Home care instructions reviewed and reasons for reevaluation discussed.  She will follow-up as needed  Start time: called home 8:51am, called cell 8:52a; called back with results 10:21a End time: 9:00a; called ended a second time at 10:22a  Total time spent on patient care (including telephone call/ virtual visit): 9 minutes  [place], [place] [phone]

## 2022-04-11 NOTE — Progress Notes (Signed)
Erroneous- was seen by another provider

## 2022-04-18 ENCOUNTER — Other Ambulatory Visit: Payer: Self-pay | Admitting: Family Medicine

## 2022-04-18 DIAGNOSIS — R0602 Shortness of breath: Secondary | ICD-10-CM

## 2022-04-19 DIAGNOSIS — J1282 Pneumonia due to coronavirus disease 2019: Secondary | ICD-10-CM | POA: Diagnosis not present

## 2022-04-19 DIAGNOSIS — U071 COVID-19: Secondary | ICD-10-CM | POA: Diagnosis not present

## 2022-04-23 ENCOUNTER — Encounter: Payer: Self-pay | Admitting: Gastroenterology

## 2022-04-26 ENCOUNTER — Other Ambulatory Visit: Payer: Self-pay | Admitting: Family Medicine

## 2022-04-26 ENCOUNTER — Ambulatory Visit (INDEPENDENT_AMBULATORY_CARE_PROVIDER_SITE_OTHER): Payer: Medicare Other | Admitting: Family Medicine

## 2022-04-26 ENCOUNTER — Encounter: Payer: Self-pay | Admitting: Family Medicine

## 2022-04-26 VITALS — BP 120/73 | HR 68 | Temp 98.7°F | Ht 61.0 in | Wt 199.0 lb

## 2022-04-26 DIAGNOSIS — E7800 Pure hypercholesterolemia, unspecified: Secondary | ICD-10-CM

## 2022-04-26 DIAGNOSIS — M47817 Spondylosis without myelopathy or radiculopathy, lumbosacral region: Secondary | ICD-10-CM | POA: Diagnosis not present

## 2022-04-26 DIAGNOSIS — Z79899 Other long term (current) drug therapy: Secondary | ICD-10-CM

## 2022-04-26 DIAGNOSIS — G8929 Other chronic pain: Secondary | ICD-10-CM | POA: Diagnosis not present

## 2022-04-26 DIAGNOSIS — R0602 Shortness of breath: Secondary | ICD-10-CM

## 2022-04-26 DIAGNOSIS — M25551 Pain in right hip: Secondary | ICD-10-CM

## 2022-04-26 DIAGNOSIS — Z1211 Encounter for screening for malignant neoplasm of colon: Secondary | ICD-10-CM

## 2022-04-26 MED ORDER — HYDROCODONE-ACETAMINOPHEN 5-325 MG PO TABS: 1.0000 | ORAL_TABLET | Freq: Every day | ORAL | 0 refills | Status: DC | PRN

## 2022-04-26 NOTE — Progress Notes (Signed)
Subjective: CC: HLD, chronic pain PCP: Janora Norlander, DO Selena Hunt is a 72 y.o. female presenting to clinic today for:  1. HLD Compliant with Pravastatin, Zetia.  No CP.  SOB better after meds.  Continues to have some wheezing after recent COVID infection.  She has not picked back up using her nebulizer so she is going to start doing this.  2.  Chronic pain syndrome Patient reports sparing use of Norco.  Still has a few tablets left over from last refill.  Has not had any issues with constipation, dizziness or falls.  She found that a tablet of Norco actually helped her coughing spells during COVID-19   ROS: Per HPI  Allergies  Allergen Reactions   Ambien [Zolpidem Tartrate] Anxiety   Diovan [Valsartan] Other (See Comments)    Seizures.   Wellbutrin [Bupropion] Other (See Comments)    Seizures.   Codeine     Feel crazy and dizzy    Doxycycline Other (See Comments)    conjunctivitis   Feldene [Piroxicam]     Swelling    Guaifenesin Er     Raise Bp / HR    Latex     Swelling    Mobic [Meloxicam] Other (See Comments)    Stomach cramps   Neurontin [Gabapentin]     "loopy"   Crestor [Rosuvastatin Calcium] Other (See Comments)    Myalgias    Lipitor [Atorvastatin Calcium] Other (See Comments)    myalgias   Livalo [Pitavastatin] Swelling and Other (See Comments)    stomach cramping    Simcor [Niacin-Simvastatin Er] Other (See Comments)    Stomach cramps   Zocor [Simvastatin] Other (See Comments)    Stomach cramps   Past Medical History:  Diagnosis Date   Benign neoplasm of colon    Blood transfusion without reported diagnosis    x 34 to date   Chronic kidney disease    Colon polyps    Diverticulitis of colon (without mention of hemorrhage)(562.11)    Diverticulosis of colon (without mention of hemorrhage)    Elevated ferritin 05/03/2019   Essential hypertension, benign    Female stress incontinence    GERD (gastroesophageal reflux disease)     Hematuria    Hernia of unspecified site of abdominal cavity without mention of obstruction or gangrene    History of closed head injury 04/21/2018   Kidney stones    has had one    Lumbosacral spondylosis without myelopathy    Lymphedema    Meniere disease    Drs are uncertain of this DX    Migraine headache    Neuromuscular disorder ([place])    Obesity    Other and unspecified hyperlipidemia    Pneumonia    Pulmonary embolism ([facility])    Seizures ([place])    HX of --- from medications,2003   Stricture and stenosis of esophagus    Thyroid disease    Ventral hernia    Vertigo 12/12/2018    Current Outpatient Medications:    albuterol (PROVENTIL) (2.5 MG/3ML) 0.083% nebulizer solution, Take 3 mLs (2.5 mg total) by nebulization every 6 (six) hours as needed for wheezing or shortness of breath., Disp: 150 mL, Rfl: 1   albuterol (VENTOLIN HFA) 108 (90 Base) MCG/ACT inhaler, INHALE TWO PUFFS BY MOUTH EVERY 6 HOURS AS NEEDED FOR WHEEZING OR SHORTNESS OF BREATH, Disp: 8.5 g, Rfl: 0   aspirin EC 81 MG tablet, Take 81 mg by mouth daily., Disp: , Rfl:  azithromycin (ZITHROMAX) 250 MG tablet, Take 2 tablets today, then take 1 tablet daily until gone., Disp: 6 tablet, Rfl: 0   benzonatate (TESSALON) 200 MG capsule, Take 1 capsule (200 mg total) by mouth 2 (two) times daily as needed for cough., Disp: 60 capsule, Rfl: 0   budesonide-formoterol (SYMBICORT) 80-4.5 MCG/ACT inhaler, INHALE TWO PUFFS BY MOUTH TWICE DAILY (RINSE MOUTH AFTER EACH USE), Disp: 10.2 g, Rfl: 0   calcium-vitamin D (OSCAL WITH D) 500-200 MG-UNIT tablet, Take 1 tablet by mouth daily with breakfast., Disp: 90 tablet, Rfl: 1   esomeprazole (NEXIUM) 40 MG capsule, Take 1 capsule (40 mg total) by mouth 2 (two) times daily before a meal., Disp: 60 capsule, Rfl: 3   ezetimibe (ZETIA) 10 MG tablet, Take 1 tablet (10 mg total) by mouth daily., Disp: 90 tablet, Rfl: 3   fluticasone (FLONASE) 50 MCG/ACT nasal spray, Place 2 sprays into both  nostrils daily., Disp: 16 g, Rfl: 6   HYDROcodone-acetaminophen (NORCO/VICODIN) 5-325 MG tablet, Take 1 tablet by mouth daily as needed for moderate pain or severe pain., Disp: 30 tablet, Rfl: 0   levothyroxine (SYNTHROID) 100 MCG tablet, TAKE 1 TABLET BY MOUTH ON MONDAY AND FRIDAY, AND TAKE 1/2 TABLET ALL THE OTHER DAYS, Disp: 90 tablet, Rfl: PRN   meclizine (ANTIVERT) 25 MG tablet, Take 1 tablet (25 mg total) by mouth every 8 (eight) hours as needed for dizziness., Disp: 30 tablet, Rfl: 2   metoprolol tartrate (LOPRESSOR) 50 MG tablet, TAKE 1/2 TABLET BY MOUTH DAILY, Disp: 45 tablet, Rfl: 0   Multiple Vitamins-Minerals (WOMENS 50+ ADVANCED PO), Take by mouth., Disp: , Rfl:    oxybutynin (DITROPAN-XL) 10 MG 24 hr tablet, TAKE 1 TABLET BY MOUTH AT BEDTIME, Disp: 30 tablet, Rfl: 2   pravastatin (PRAVACHOL) 20 MG tablet, Take 1 tablet (20 mg total) by mouth daily. Could not tolerate '40mg'$ , Disp: 90 tablet, Rfl: 3   predniSONE (DELTASONE) 20 MG tablet, 2 po at same time daily for 5 days, Disp: 10 tablet, Rfl: 0   triamterene-hydrochlorothiazide (MAXZIDE-25) 37.5-25 MG tablet, TAKE 1/2 TABLET BY MOUTH EVERY DAY, Disp: 15 tablet, Rfl: 2   Vitamin D, Ergocalciferol, (DRISDOL) 1.25 MG (50000 UNIT) CAPS capsule, TAKE ONE CAPSULE BY MOUTH EVERY SEVEN DAYS, Disp: 12 capsule, Rfl: 3 Social History   Socioeconomic History   Marital status: Widowed    Spouse name: Alida Greiner   Number of children: 2   Years of education: Not on file   Highest education level: 12th grade  Occupational History   Occupation: Retired    Fish farm manager: Engineer, [employer]    Comment: 2005   Tobacco Use   Smoking status: Never   Smokeless tobacco: Never  Vaping Use   Vaping Use: Never used  Substance and Sexual Activity   Alcohol use: Not Currently    Alcohol/week: 0.0 standard drinks of alcohol    Comment: wine once or twice a year   Drug use: No   Sexual activity: Never    Birth control/protection: Post-menopausal  Other  Topics Concern   Not on file  Social History Narrative   Lives alone.    Caffeine 2 cups daily    Right handed   Social Determinants of Health   Financial Resource Strain: Low Risk  (07/30/2021)   Overall Financial Resource Strain (CARDIA)    Difficulty of Paying Living Expenses: Not very hard  Food Insecurity: No Food Insecurity (07/30/2021)   Hunger Vital Sign    Worried About  Running Out of Food in the Last Year: Never true    [place] in the Last Year: Never true  Transportation Needs: No Transportation Needs (07/30/2021)   PRAPARE - Hydrologist (Medical): No    Lack of Transportation (Non-Medical): No  Physical Activity: Insufficiently Active (07/30/2021)   Exercise Vital Sign    Days of Exercise per Week: 2 days    Minutes of Exercise per Session: 20 min  Stress: No Stress Concern Present (07/30/2021)   [place]    Feeling of Stress : Not at all  Social Connections: Moderately Integrated (07/30/2021)   Social Connection and Isolation Panel [NHANES]    Frequency of Communication with Friends and Family: More than three times a week    Frequency of Social Gatherings with Friends and Family: More than three times a week    Attends Religious Services: More than 4 times per year    Active Member of [employer] or Organizations: Yes    Attends Archivist Meetings: More than 4 times per year    Marital Status: Widowed  Intimate Partner Violence: Not At Risk (07/30/2021)   Humiliation, Afraid, Rape, and Kick questionnaire    Fear of Current or Ex-Partner: No    Emotionally Abused: No    Physically Abused: No    Sexually Abused: No   Family History  Problem Relation Age of Onset   Alzheimer's disease Mother    Hypertension Mother    Stroke Mother    Alzheimer's disease Father    Hypertension Father    Gout Brother    Fibromyalgia Daughter    Hypertension Son     Depression Son    Spondylitis Son        spondylosis   GER disease Son    Heart disease Maternal Grandmother    Heart disease Maternal Grandfather    Congestive Heart Failure Maternal Grandfather    Stroke Paternal Grandmother    Hypertension Paternal Grandmother    Depression Paternal Grandfather    Suicidality Paternal Grandfather    Colon cancer Neg Hx    Esophageal cancer Neg Hx    Pancreatic cancer Neg Hx    Stomach cancer Neg Hx    Liver disease Neg Hx     Objective: Office vital signs reviewed. BP 120/73   Pulse 68   Temp 98.7 F (37.1 C)   Ht '5\' 1"'$  (1.549 m)   Wt 199 lb (90.3 kg)   SpO2 93%   BMI 37.60 kg/m   Physical Examination:  General: Awake, alert, well nourished, No acute distress HEENT:  sclera white, MMM Cardio: regular rate and rhythm, S1S2 heard, no murmurs appreciated Pulm: bibasilar crackles, mild.  Normal WOB on room air. No wheezing.  MSK: antalgic but independent gait  Assessment/ Plan: 72 y.o. female   Chronic right hip pain - Plan: ToxASSURE Select 13 (MW), Urine, HYDROcodone-acetaminophen (NORCO/VICODIN) 5-325 MG tablet  Lumbosacral spondylosis without myelopathy - Plan: ToxASSURE Select 13 (MW), Urine, HYDROcodone-acetaminophen (NORCO/VICODIN) 5-325 MG tablet  Controlled substance agreement signed - Plan: ToxASSURE Select 13 (MW), Urine  Colon cancer screening - Plan: Ambulatory referral to Gastroenterology  Pure hypercholesterolemia - Plan: Lipid panel, Hepatic function panel  No red flag signs or symptoms.  Pain is controlled with sparing use of norco. UDS and CSC updated.  The Narcotic Database has been reviewed.  There were no red flags.    Future lab  for fasting lipid and liver function test placed.    Referral back to gastroenterology for screening for colon cancer   No orders of the defined types were placed in this encounter.  No orders of the defined types were placed in this encounter.    Janora Norlander,  DO [place] [phone]

## 2022-04-29 ENCOUNTER — Other Ambulatory Visit: Payer: Medicare Other

## 2022-04-29 DIAGNOSIS — E7800 Pure hypercholesterolemia, unspecified: Secondary | ICD-10-CM | POA: Diagnosis not present

## 2022-04-30 LAB — HEPATIC FUNCTION PANEL
ALT: 21 IU/L (ref 0–32)
AST: 25 IU/L (ref 0–40)
Albumin: 4 g/dL (ref 3.8–4.8)
Alkaline Phosphatase: 117 IU/L (ref 44–121)
Bilirubin Total: 0.4 mg/dL (ref 0.0–1.2)
Bilirubin, Direct: 0.14 mg/dL (ref 0.00–0.40)
Total Protein: 6 g/dL (ref 6.0–8.5)

## 2022-04-30 LAB — LIPID PANEL
Chol/HDL Ratio: 3.5 ratio (ref 0.0–4.4)
Cholesterol, Total: 165 mg/dL (ref 100–199)
HDL: 47 mg/dL (ref >39)
LDL Chol Calc (NIH): 97 mg/dL (ref 0–99)
Triglycerides: 116 mg/dL (ref 0–149)
VLDL Cholesterol Cal: 21 mg/dL (ref 5–40)

## 2022-05-01 LAB — TOXASSURE SELECT 13 (MW), URINE

## 2022-05-02 ENCOUNTER — Telehealth: Payer: Self-pay | Admitting: *Deleted

## 2022-05-02 ENCOUNTER — Ambulatory Visit: Payer: Medicare Other

## 2022-05-02 NOTE — Telephone Encounter (Signed)
Given O2 needs and age please schedule an office appt to determine if repeat colonoscopy is necessary.

## 2022-05-02 NOTE — Telephone Encounter (Signed)
Pt. Scheduled for OV on 06/24/22.

## 2022-05-02 NOTE — Telephone Encounter (Signed)
Pt. Is on schedule for recall colonoscopy she had covid in December of 2020 , when d/c from hospital she went home on oxygen she currently on 2 L @ night and is trying to wean herself off but still has to use in strenuous situations,informed her, I will be cancelling procedure for 05/09/22 in [facility] and we will contact her with Dr. Daphine Deutscher understanding.

## 2022-05-02 NOTE — Progress Notes (Unsigned)
While interviewing pt. For  pre-visit she informed me that she is on oxygen @ 2L at night and when she does something strenuous,she will have to wear the oxygen,stated " I had covid  in December of 2020 and when I came home they sent me home with oxygen,I am trying to wean myself off of it".explained to pt. Because she is on oxygen that she would have to be done at the hospital.she stated "I will be fine ,"she verbalized understanding.Informed her that I will cancel procedure scheduled 05/09/22 and pre-visit and contact DR. And our office will be contact with her with DR. Recommendations.

## 2022-05-09 ENCOUNTER — Encounter: Payer: Medicare Other | Admitting: Gastroenterology

## 2022-05-19 ENCOUNTER — Other Ambulatory Visit: Payer: Self-pay | Admitting: Family Medicine

## 2022-05-19 DIAGNOSIS — R600 Localized edema: Secondary | ICD-10-CM

## 2022-05-19 DIAGNOSIS — I10 Essential (primary) hypertension: Secondary | ICD-10-CM

## 2022-05-20 DIAGNOSIS — J1282 Pneumonia due to coronavirus disease 2019: Secondary | ICD-10-CM | POA: Diagnosis not present

## 2022-05-20 DIAGNOSIS — U071 COVID-19: Secondary | ICD-10-CM | POA: Diagnosis not present

## 2022-06-18 DIAGNOSIS — J1282 Pneumonia due to coronavirus disease 2019: Secondary | ICD-10-CM | POA: Diagnosis not present

## 2022-06-18 DIAGNOSIS — U071 COVID-19: Secondary | ICD-10-CM | POA: Diagnosis not present

## 2022-06-24 ENCOUNTER — Ambulatory Visit (INDEPENDENT_AMBULATORY_CARE_PROVIDER_SITE_OTHER): Payer: Medicare Other | Admitting: Gastroenterology

## 2022-06-24 ENCOUNTER — Encounter: Payer: Self-pay | Admitting: Gastroenterology

## 2022-06-24 VITALS — BP 124/68 | HR 67 | Ht 61.0 in | Wt 200.0 lb

## 2022-06-24 DIAGNOSIS — Z86010 Personal history of colonic polyps: Secondary | ICD-10-CM

## 2022-06-24 NOTE — Patient Instructions (Addendum)
You are due for a colonoscopy in October 2025.  Please follow up as needed.  _______________________________________________________  If your blood pressure at your visit was 140/90 or greater, please contact your primary care physician to follow up on this.  _______________________________________________________  If you are age 74 or older, your body mass index should be between 23-30. Your Body mass index is 37.79 kg/m. If this is out of the aforementioned range listed, please consider follow up with your Primary Care Provider.  If you are age 31 or younger, your body mass index should be between 19-25. Your Body mass index is 37.79 kg/m. If this is out of the aformentioned range listed, please consider follow up with your Primary Care Provider.   ________________________________________________________  The [facility] providers would like to encourage you to use [facility] to communicate with providers for non-urgent requests or questions.  Due to long hold times on the telephone, sending your provider a message by [facility] may be a faster and more efficient way to get a response.  Please allow 48 business hours for a response.  Please remember that this is for non-urgent requests.  _______________________________________________________ It was a pleasure to see you today!  Thank you for trusting me with your gastrointestinal care!

## 2022-06-24 NOTE — Progress Notes (Signed)
Assessment    Personal history of adenomatous colon polyps  GERD, hiatal hernia, history of an esophageal stricture Chronic respiratory failure on O2 at night post lung contusion with lung collapse in 2003 and post Covid pneumonia in 2020  Recommendations   Change colonoscopy recall to 7 year interval, Oct 2025 Continue Nexium 40 mg po bid, Gas-X 4 times daily as needed and follow antireflux measures.    HPI   Chief complaint: Personal history of adenomatous colon polyps, GERD  Patient profile:  Selena Hunt is a 72 y.o. female referred by Adam Phenix, DO for personal history of adenomatous colon polyps.  Colonoscopy 2018 below.  Initial recall interval set at 5 years.  She has no ongoing gastrointestinal complaints except for occasional reflux symptoms and upper intestinal gas.  She notes certain foods exacerbate her reflux.  She remains on O2 at night following COVID-pneumonia in 2020.  Recent chest x-ray showed a large hiatal hernia.  EGD in 2018 showed a small hiatal hernia.  Denies weight loss, abdominal pain, constipation, diarrhea, change in stool caliber, melena, hematochezia, nausea, vomiting, dysphagia, chest pain.   Previous Labs / Imaging::    Latest Ref Rng & Units 07/02/2021    8:49 AM 06/09/2020    9:03 AM 05/03/2019   10:16 AM  CBC  WBC 3.4 - 10.8 x10E3/uL 5.2  5.8  6.9   Hemoglobin 11.1 - 15.9 g/dL 14.1  14.6  13.6   Hematocrit 34.0 - 46.6 % 40.9  43.4  39.9   Platelets 150 - 450 x10E3/uL 232  198  178     Lab Results  Component Value Date   LIPASE 36 06/23/2017      Latest Ref Rng & Units 04/29/2022    9:07 AM 01/23/2022    8:29 AM 07/02/2021    8:49 AM  CMP  Glucose 70 - 99 mg/dL  88  94   BUN 8 - 27 mg/dL  14  17   Creatinine 0.57 - 1.00 mg/dL  0.99  1.00   Sodium 134 - 144 mmol/L  147  141   Potassium 3.5 - 5.2 mmol/L  4.1  4.2   Chloride 96 - 106 mmol/L  105  102   CO2 20 - 29 mmol/L  24  26   Calcium 8.7 - 10.3 mg/dL  9.4  9.9   Total  Protein 6.0 - 8.5 g/dL 6.0  6.1  6.2   Total Bilirubin 0.0 - 1.2 mg/dL 0.4  0.3  0.6   Alkaline Phos 44 - 121 IU/L 117  110  92   AST 0 - 40 IU/L 25  23  26    ALT 0 - 32 IU/L 21  18  25       Previous GI evaluation    Endoscopies:  Colonoscopy Oct 2018   Imaging:  DG Chest 2 View CLINICAL DATA:  COVID with increased oxygen requirement  EXAM: CHEST - 2 VIEW  COMPARISON:  Chest radiograph dated 08/28/2020  FINDINGS: Normal lung volumes. Lingular patchy opacity. Retrocardiac opacity with fluid levels, likely large hiatal hernia. No pleural effusion or pneumothorax. The heart size and mediastinal contours are within normal limits. The visualized skeletal structures are unremarkable.  IMPRESSION: 1. Lingular patchy opacity may reflect atelectasis, aspiration, or pneumonia. 2. Retrocardiac opacity with fluid levels, likely large hiatal hernia.  Electronically Signed   By: Darrin Nipper M.D.   On: 04/10/2022 10:15    Past Medical History:  Diagnosis Date  Allergy    SEASONAL   Benign neoplasm of colon    Blood transfusion without reported diagnosis    x 34 to date   Cancer ([place])    [place] left [place]ham   Chronic kidney disease    Colon polyps    Diverticulitis of colon (without mention of hemorrhage)(562.11)    Diverticulosis of colon (without mention of hemorrhage)    Elevated ferritin 05/03/2019   Essential hypertension, benign    Female stress incontinence    GERD (gastroesophageal reflux disease)    Hematuria    Hernia of unspecified site of abdominal cavity without mention of obstruction or gangrene    History of closed head injury 04/21/2018   Kidney stones    has had one    Lumbosacral spondylosis without myelopathy    Lymphedema    Meniere disease    Drs are uncertain of this DX    Migraine headache    Neuromuscular disorder ([place])    Obesity    Other and unspecified hyperlipidemia    Oxygen deficiency    Pneumonia    Pulmonary embolism (HCC)     Seizures (HCC)    HX of --- from medications,2003   Stricture and stenosis of esophagus    Thyroid disease    Ventral hernia    Vertigo 12/12/2018   Past Surgical History:  Procedure Laterality Date   ABDOMINAL HYSTERECTOMY  1990   APPENDECTOMY     cochlar     COLON SURGERY  1997   Removed   FEMORAL VARUS OSTEOTOMY W/ ADDUCTOR RELEASE AND ILIAC CREST BONE GRAFT, PELVIC OSTEOTOMY     FEMUR FRACTURE SURGERY Left 2003   HERNIA REPAIR     with mesh, abdominal   HIP FRACTURE SURGERY Left    HUMERUS FRACTURE SURGERY Left    LUMBAR FUSION  1995   Fusion L4 - L5   PARTIAL COLECTOMY     Secondary to diverticulitis   TENDON TRANSFER     TUBAL LIGATION  1972   ULNAR NERVE REPAIR Left    Family History  Problem Relation Age of Onset   Alzheimer's disease Mother    Hypertension Mother    Stroke Mother    Alzheimer's disease Father    Hypertension Father    Gout Brother    Fibromyalgia Daughter    Hypertension Son    Depression Son    Spondylitis Son        spondylosis   GER disease Son    Heart disease Maternal Grandmother    Heart disease Maternal Grandfather    Congestive Heart Failure Maternal Grandfather    Stroke Paternal Grandmother    Hypertension Paternal Grandmother    Depression Paternal Grandfather    Suicidality Paternal Grandfather    Colon cancer Neg Hx    Esophageal cancer Neg Hx    Pancreatic cancer Neg Hx    Stomach cancer Neg Hx    Liver disease Neg Hx    Social History   Tobacco Use   Smoking status: Never   Smokeless tobacco: Never  Vaping Use   Vaping Use: Never used  Substance Use Topics   Alcohol use: Not Currently   Drug use: No   Current Outpatient Medications  Medication Sig Dispense Refill   albuterol (PROVENTIL) (2.5 MG/3ML) 0.083% nebulizer solution Take 3 mLs (2.5 mg total) by nebulization every 6 (six) hours as needed for wheezing or shortness of breath. 150 mL 1   albuterol (VENTOLIN HFA) 108 (90 Base)  MCG/ACT inhaler INHALE  TWO PUFFS BY MOUTH EVERY 6 HOURS AS NEEDED FOR WHEEZING OR SHORTNESS OF BREATH 8.5 g 0   aspirin EC 81 MG tablet Take 81 mg by mouth daily.     benzonatate (TESSALON) 200 MG capsule Take 1 capsule (200 mg total) by mouth 2 (two) times daily as needed for cough. 60 capsule 0   budesonide-formoterol (SYMBICORT) 80-4.5 MCG/ACT inhaler INHALE TWO PUFFS BY MOUTH TWICE DAILY (RINSE MOUTH AFTER EACH USE) 10.2 g 0   calcium-vitamin D (OSCAL WITH D) 500-200 MG-UNIT tablet Take 1 tablet by mouth daily with breakfast. 90 tablet 1   cycloSPORINE (RESTASIS) 0.05 % ophthalmic emulsion      esomeprazole (NEXIUM) 40 MG capsule Take 1 capsule (40 mg total) by mouth 2 (two) times daily before a meal. 60 capsule 3   ezetimibe (ZETIA) 10 MG tablet Take 1 tablet (10 mg total) by mouth daily. 90 tablet 3   fluticasone (FLONASE) 50 MCG/ACT nasal spray Place 2 sprays into both nostrils daily. 16 g 6   HYDROcodone-acetaminophen (NORCO/VICODIN) 5-325 MG tablet Take 1 tablet by mouth daily as needed for moderate pain or severe pain. 30 tablet 0   levothyroxine (SYNTHROID) 100 MCG tablet TAKE 1 TABLET BY MOUTH ON MONDAY AND FRIDAY, AND TAKE 1/2 TABLET ALL THE OTHER DAYS 90 tablet PRN   meclizine (ANTIVERT) 25 MG tablet Take 1 tablet (25 mg total) by mouth every 8 (eight) hours as needed for dizziness. 30 tablet 2   metoprolol tartrate (LOPRESSOR) 50 MG tablet TAKE 1/2 TABLET BY MOUTH DAILY 45 tablet 0   Multiple Vitamins-Minerals (WOMENS 50+ ADVANCED PO) Take by mouth.     oxybutynin (DITROPAN-XL) 10 MG 24 hr tablet TAKE 1 TABLET BY MOUTH AT BEDTIME 30 tablet 1   pravastatin (PRAVACHOL) 20 MG tablet Take 1 tablet (20 mg total) by mouth daily. Could not tolerate 40mg  90 tablet 3   triamterene-hydrochlorothiazide (MAXZIDE-25) 37.5-25 MG tablet TAKE 1/2 TABLET BY MOUTH EVERY DAY 15 tablet 1   Vitamin D, Ergocalciferol, (DRISDOL) 1.25 MG (50000 UNIT) CAPS capsule TAKE ONE CAPSULE BY MOUTH EVERY SEVEN DAYS 12 capsule 3    azithromycin (ZITHROMAX) 250 MG tablet Take 2 tablets today, then take 1 tablet daily until gone. (Patient not taking: Reported on 06/24/2022) 6 tablet 0   predniSONE (DELTASONE) 20 MG tablet 2 po at same time daily for 5 days (Patient not taking: Reported on 06/24/2022) 10 tablet 0   No current facility-administered medications for this visit.   Allergies  Allergen Reactions   Ambien [Zolpidem Tartrate] Anxiety   Diovan [Valsartan] Other (See Comments)    Seizures.   Wellbutrin [Bupropion] Other (See Comments)    Seizures.   Codeine     Feel crazy and dizzy    Doxycycline Other (See Comments)    conjunctivitis   Feldene [Piroxicam]     Swelling    Guaifenesin Er     Raise Bp / HR    Latex     Swelling    Mobic [Meloxicam] Other (See Comments)    Stomach cramps   Neurontin [Gabapentin]     "loopy"   Crestor [Rosuvastatin Calcium] Other (See Comments)    Myalgias    Lipitor [Atorvastatin Calcium] Other (See Comments)    myalgias   Livalo [Pitavastatin] Swelling and Other (See Comments)    stomach cramping    Simcor [Niacin-Simvastatin Er] Other (See Comments)    Stomach cramps   Zocor [Simvastatin] Other (See Comments)  Stomach cramps    Review of Systems: All other systems reviewed and negative except where noted in HPI.    Physical Exam    Wt Readings from Last 3 Encounters:  06/24/22 200 lb (90.7 kg)  05/02/22 198 lb (89.8 kg)  04/26/22 199 lb (90.3 kg)    Ht 5\' 1"  (1.549 m)   Wt 200 lb (90.7 kg)   BMI 37.79 kg/m  Constitutional:  Generally well appearing female in no acute distress. HEENT: Pupils normal.  Conjunctivae are normal. No scleral icterus. No oral lesions or deformities noted.  Neck: Supple.  Cardiac: Normal rate, regular rhythm without murmurs. Pulmonary/chest: Effort normal and breath sounds normal. No wheezing, rales or rhonchi. Abdominal: Soft, nondistended, nontender. Active bowel sounds. No palpable HSM, masses or hernias. Rectal:  Not  done Extremities: No edema or deformities noted Neurological: Alert and oriented to person, place and time. Psychiatric: Pleasant. Normal mood and affect. Behavior is normal. Skin: Skin is warm and dry. No rashes noted.  Lucio Edward, MD   cc:  Referring Provider Janora Norlander, DO

## 2022-07-17 ENCOUNTER — Other Ambulatory Visit: Payer: Self-pay | Admitting: Family Medicine

## 2022-07-17 DIAGNOSIS — E034 Atrophy of thyroid (acquired): Secondary | ICD-10-CM

## 2022-07-17 DIAGNOSIS — R600 Localized edema: Secondary | ICD-10-CM

## 2022-07-19 DIAGNOSIS — U071 COVID-19: Secondary | ICD-10-CM | POA: Diagnosis not present

## 2022-07-19 DIAGNOSIS — J1282 Pneumonia due to coronavirus disease 2019: Secondary | ICD-10-CM | POA: Diagnosis not present

## 2022-07-26 DIAGNOSIS — H90A21 Sensorineural hearing loss, unilateral, right ear, with restricted hearing on the contralateral side: Secondary | ICD-10-CM | POA: Diagnosis not present

## 2022-07-26 DIAGNOSIS — Z4889 Encounter for other specified surgical aftercare: Secondary | ICD-10-CM | POA: Diagnosis not present

## 2022-07-26 DIAGNOSIS — H9190 Unspecified hearing loss, unspecified ear: Secondary | ICD-10-CM | POA: Diagnosis not present

## 2022-07-26 DIAGNOSIS — H8101 Meniere's disease, right ear: Secondary | ICD-10-CM | POA: Diagnosis not present

## 2022-07-26 DIAGNOSIS — Z9621 Cochlear implant status: Secondary | ICD-10-CM | POA: Diagnosis not present

## 2022-08-05 ENCOUNTER — Ambulatory Visit (INDEPENDENT_AMBULATORY_CARE_PROVIDER_SITE_OTHER): Payer: Medicare Other

## 2022-08-05 VITALS — Ht 61.0 in | Wt 198.0 lb

## 2022-08-05 DIAGNOSIS — Z0000 Encounter for general adult medical examination without abnormal findings: Secondary | ICD-10-CM | POA: Diagnosis not present

## 2022-08-05 NOTE — Patient Instructions (Signed)
Selena Hunt , Thank you for taking time to come for your Medicare Wellness Visit. I appreciate your ongoing commitment to your health goals. Please review the following plan we discussed and let me know if I can assist you in the future.   These are the goals we discussed:  Goals      AWV     05/31/2019 AWV Goal: Exercise for General Health  Patient will verbalize understanding of the benefits of increased physical activity: Exercising regularly is important. It will improve your overall fitness, flexibility, and endurance. Regular exercise also will improve your overall health. It can help you control your weight, reduce stress, and improve your bone density. Over the next year, patient will increase physical activity as tolerated with a goal of at least 150 minutes of moderate physical activity per week.  You can tell that you are exercising at a moderate intensity if your heart starts beating faster and you start breathing faster but can still hold a conversation. Moderate-intensity exercise ideas include: Walking 1 mile (1.6 km) in about 15 minutes Biking Hiking Golfing Dancing Water aerobics Patient will verbalize understanding of everyday activities that increase physical activity by providing examples like the following: Yard work, such as: Insurance underwriter Gardening Washing windows or floors Patient will be able to explain general safety guidelines for exercising:  Before you start a new exercise program, talk with your health care provider. Do not exercise so much that you hurt yourself, feel dizzy, or get very short of breath. Wear comfortable clothes and wear shoes with good support. Drink plenty of water while you exercise to prevent dehydration or heat stroke. Work out until your breathing and your heartbeat get faster.      Weight (lb) < 140 lb (63.5 kg)     Wants off of meds - HTN ,  lipids         This is a list of the screening recommended for you and due dates:  Health Maintenance  Topic Date Due   Zoster (Shingles) Vaccine (1 of 2) Never done   COVID-19 Vaccine (4 - 2023-24 season) 12/07/2021   Colon Cancer Screening  04/27/2023*   Flu Shot  11/07/2022   Mammogram  11/07/2022   DEXA scan (bone density measurement)  01/10/2023   Medicare Annual Wellness Visit  08/05/2023   DTaP/Tdap/Td vaccine (4 - Td or Tdap) 01/24/2032   Pneumonia Vaccine  Completed   Hepatitis C Screening: USPSTF Recommendation to screen - Ages 23-79 yo.  Completed   HPV Vaccine  Aged Out  *Topic was postponed. The date shown is not the original due date.    Advanced directives: Please bring a copy of your health care power of attorney and living will to the office to be added to your chart at your convenience.   Conditions/risks identified: Aim for 30 minutes of exercise or brisk walking, 6-8 glasses of water, and 5 servings of fruits and vegetables each day.   Next appointment: Follow up in one year for your annual wellness visit    Preventive Care 65 Years and Older, Female Preventive care refers to lifestyle choices and visits with your health care provider that can promote health and wellness. What does preventive care include? A yearly physical exam. This is also called an annual well check. Dental exams once or twice a year. Routine eye exams. Ask your health care provider how often you should  have your eyes checked. Personal lifestyle choices, including: Daily care of your teeth and gums. Regular physical activity. Eating a healthy diet. Avoiding tobacco and drug use. Limiting alcohol use. Practicing safe sex. Taking low-dose aspirin every day. Taking vitamin and mineral supplements as recommended by your health care provider. What happens during an annual well check? The services and screenings done by your health care provider during your annual well check will depend  on your age, overall health, lifestyle risk factors, and family history of disease. Counseling  Your health care provider may ask you questions about your: Alcohol use. Tobacco use. Drug use. Emotional well-being. Home and relationship well-being. Sexual activity. Eating habits. History of falls. Memory and ability to understand (cognition). Work and work Astronomer. Reproductive health. Screening  You may have the following tests or measurements: Height, weight, and BMI. Blood pressure. Lipid and cholesterol levels. These may be checked every 5 years, or more frequently if you are over 66 years old. Skin check. Lung cancer screening. You may have this screening every year starting at age 79 if you have a 30-pack-year history of smoking and currently smoke or have quit within the past 15 years. Fecal occult blood test (FOBT) of the stool. You may have this test every year starting at age 38. Flexible sigmoidoscopy or colonoscopy. You may have a sigmoidoscopy every 5 years or a colonoscopy every 10 years starting at age 67. Hepatitis C blood test. Hepatitis B blood test. Sexually transmitted disease (STD) testing. Diabetes screening. This is done by checking your blood sugar (glucose) after you have not eaten for a while (fasting). You may have this done every 1-3 years. Bone density scan. This is done to screen for osteoporosis. You may have this done starting at age 30. Mammogram. This may be done every 1-2 years. Talk to your health care provider about how often you should have regular mammograms. Talk with your health care provider about your test results, treatment options, and if necessary, the need for more tests. Vaccines  Your health care provider may recommend certain vaccines, such as: Influenza vaccine. This is recommended every year. Tetanus, diphtheria, and acellular pertussis (Tdap, Td) vaccine. You may need a Td booster every 10 years. Zoster vaccine. You may need  this after age 19. Pneumococcal 13-valent conjugate (PCV13) vaccine. One dose is recommended after age 26. Pneumococcal polysaccharide (PPSV23) vaccine. One dose is recommended after age 71. Talk to your health care provider about which screenings and vaccines you need and how often you need them. This information is not intended to replace advice given to you by your health care provider. Make sure you discuss any questions you have with your health care provider. Document Released: 04/21/2015 Document Revised: 12/13/2015 Document Reviewed: 01/24/2015 Elsevier Interactive Patient Education  2017 [employer].  Fall Prevention in the Home Falls can cause injuries. They can happen to people of all ages. There are many things you can do to make your home safe and to help prevent falls. What can I do on the outside of my home? Regularly fix the edges of walkways and driveways and fix any cracks. Remove anything that might make you trip as you walk through a door, such as a raised step or threshold. Trim any bushes or trees on the path to your home. Use bright outdoor lighting. Clear any walking paths of anything that might make someone trip, such as rocks or tools. Regularly check to see if handrails are loose or broken. Make sure  that both sides of any steps have handrails. Any raised decks and porches should have guardrails on the edges. Have any leaves, snow, or ice cleared regularly. Use sand or salt on walking paths during winter. Clean up any spills in your garage right away. This includes oil or grease spills. What can I do in the bathroom? Use night lights. Install grab bars by the toilet and in the tub and shower. Do not use towel bars as grab bars. Use non-skid mats or decals in the tub or shower. If you need to sit down in the shower, use a plastic, non-slip stool. Keep the floor dry. Clean up any water that spills on the floor as soon as it happens. Remove soap buildup in the tub  or shower regularly. Attach bath mats securely with double-sided non-slip rug tape. Do not have throw rugs and other things on the floor that can make you trip. What can I do in the bedroom? Use night lights. Make sure that you have a light by your bed that is easy to reach. Do not use any sheets or blankets that are too big for your bed. They should not hang down onto the floor. Have a firm chair that has side arms. You can use this for support while you get dressed. Do not have throw rugs and other things on the floor that can make you trip. What can I do in the kitchen? Clean up any spills right away. Avoid walking on wet floors. Keep items that you use a lot in easy-to-reach places. If you need to reach something above you, use a strong step stool that has a grab bar. Keep electrical cords out of the way. Do not use floor polish or wax that makes floors slippery. If you must use wax, use non-skid floor wax. Do not have throw rugs and other things on the floor that can make you trip. What can I do with my stairs? Do not leave any items on the stairs. Make sure that there are handrails on both sides of the stairs and use them. Fix handrails that are broken or loose. Make sure that handrails are as long as the stairways. Check any carpeting to make sure that it is firmly attached to the stairs. Fix any carpet that is loose or worn. Avoid having throw rugs at the top or bottom of the stairs. If you do have throw rugs, attach them to the floor with carpet tape. Make sure that you have a light switch at the top of the stairs and the bottom of the stairs. If you do not have them, ask someone to add them for you. What else can I do to help prevent falls? Wear shoes that: Do not have high heels. Have rubber bottoms. Are comfortable and fit you well. Are closed at the toe. Do not wear sandals. If you use a stepladder: Make sure that it is fully opened. Do not climb a closed stepladder. Make  sure that both sides of the stepladder are locked into place. Ask someone to hold it for you, if possible. Clearly mark and make sure that you can see: Any grab bars or handrails. First and last steps. Where the edge of each step is. Use tools that help you move around (mobility aids) if they are needed. These include: Canes. Walkers. Scooters. Crutches. Turn on the lights when you go into a dark area. Replace any light bulbs as soon as they burn out. Set up your furniture  so you have a clear path. Avoid moving your furniture around. If any of your floors are uneven, fix them. If there are any pets around you, be aware of where they are. Review your medicines with your doctor. Some medicines can make you feel dizzy. This can increase your chance of falling. Ask your doctor what other things that you can do to help prevent falls. This information is not intended to replace advice given to you by your health care provider. Make sure you discuss any questions you have with your health care provider. Document Released: 01/19/2009 Document Revised: 08/31/2015 Document Reviewed: 04/29/2014 Elsevier Interactive Patient Education  2017 [employer].

## 2022-08-05 NOTE — Progress Notes (Signed)
Subjective:   Selena Hunt is a 72 y.o. female who presents for Medicare Annual (Subsequent) preventive examination. I connected with  Darcella Gasman on 08/05/22 by a audio enabled telemedicine application and verified that I am speaking with the correct person using two identifiers.  Patient Location: Home  Provider Location: Home Office  I discussed the limitations of evaluation and management by telemedicine. The patient expressed understanding and agreed to proceed.  Review of Systems     Cardiac Risk Factors include: advanced age (>24men, >27 women);dyslipidemia;hypertension     Objective:    Today's Vitals   08/05/22 1459  Weight: 198 lb (89.8 kg)  Height: 5\' 1"  (1.549 m)   Body mass index is 37.41 kg/m.     08/05/2022    3:03 PM 07/30/2021    3:02 PM 07/28/2020    2:35 PM 10/05/2019    2:43 PM 05/31/2019    8:48 AM 03/31/2019    6:04 PM 03/31/2019    5:14 PM  Advanced Directives  Does Patient Have a Medical Advance Directive? Yes Yes Yes No Yes Yes Yes  Type of Estate agent of [place];Living will Healthcare Power of [place];Living will Healthcare Power of [place];Living will  Healthcare Power of [place];Living will;Out of facility DNR (pink MOST or yellow form) Living will Living will  Does patient want to make changes to medical advance directive?      No - Patient declined   Copy of Healthcare Power of Attorney in Chart? No - copy requested No - copy requested No - copy requested      Would patient like information on creating a medical advance directive?    No - Patient declined       Current Medications (verified) Outpatient Encounter Medications as of 08/05/2022  Medication Sig   albuterol (PROVENTIL) (2.5 MG/3ML) 0.083% nebulizer solution Take 3 mLs (2.5 mg total) by nebulization every 6 (six) hours as needed for wheezing or shortness of breath.   albuterol (VENTOLIN HFA) 108 (90 Base) MCG/ACT inhaler INHALE TWO PUFFS BY MOUTH EVERY 6  HOURS AS NEEDED FOR WHEEZING OR SHORTNESS OF BREATH   benzonatate (TESSALON) 200 MG capsule Take 1 capsule (200 mg total) by mouth 2 (two) times daily as needed for cough.   budesonide-formoterol (SYMBICORT) 80-4.5 MCG/ACT inhaler INHALE TWO PUFFS BY MOUTH TWICE DAILY (RINSE MOUTH AFTER EACH USE)   calcium-vitamin D (OSCAL WITH D) 500-200 MG-UNIT tablet Take 1 tablet by mouth daily with breakfast.   cycloSPORINE (RESTASIS) 0.05 % ophthalmic emulsion    esomeprazole (NEXIUM) 40 MG capsule Take 1 capsule (40 mg total) by mouth 2 (two) times daily before a meal.   ezetimibe (ZETIA) 10 MG tablet Take 1 tablet (10 mg total) by mouth daily.   fluticasone (FLONASE) 50 MCG/ACT nasal spray Place 2 sprays into both nostrils daily.   HYDROcodone-acetaminophen (NORCO/VICODIN) 5-325 MG tablet Take 1 tablet by mouth daily as needed for moderate pain or severe pain.   levothyroxine (SYNTHROID) 100 MCG tablet TAKE 1 TABLET BY MOUTH ON MONDAYS AND FRIDAYS, AND TAKE 1/2 TABLET ALL OTHER DAYS   meclizine (ANTIVERT) 25 MG tablet Take 1 tablet (25 mg total) by mouth every 8 (eight) hours as needed for dizziness.   metoprolol tartrate (LOPRESSOR) 50 MG tablet TAKE 1/2 TABLET BY MOUTH DAILY   Multiple Vitamins-Minerals (WOMENS 50+ ADVANCED PO) Take by mouth.   oxybutynin (DITROPAN-XL) 10 MG 24 hr tablet Take 1 tablet (10 mg total) by mouth at bedtime. (  NEEDS TO BE SEEN BEFORE NEXT REFILL)   pravastatin (PRAVACHOL) 20 MG tablet Take 1 tablet (20 mg total) by mouth daily. Could not tolerate 40mg    triamterene-hydrochlorothiazide (MAXZIDE-25) 37.5-25 MG tablet Take 0.5 tablets by mouth daily. (NEEDS TO BE SEEN BEFORE NEXT REFILL)   Vitamin D, Ergocalciferol, (DRISDOL) 1.25 MG (50000 UNIT) CAPS capsule TAKE ONE CAPSULE BY MOUTH EVERY SEVEN DAYS   aspirin EC 81 MG tablet Take 81 mg by mouth daily.   azithromycin (ZITHROMAX) 250 MG tablet Take 2 tablets today, then take 1 tablet daily until gone.   predniSONE (DELTASONE) 20  MG tablet 2 po at same time daily for 5 days (Patient not taking: Reported on 06/24/2022)   [DISCONTINUED] albuterol (VENTOLIN HFA) 108 (90 Base) MCG/ACT inhaler INHALE TWO PUFFS BY MOUTH EVERY 6 HOURS AS NEEDED FOR WHEEZING OR SHORTNESS OF BREATH   [DISCONTINUED] HYDROcodone-acetaminophen (NORCO/VICODIN) 5-325 MG tablet Take 1 tablet by mouth daily as needed for moderate pain or severe pain.   [DISCONTINUED] levothyroxine (SYNTHROID) 100 MCG tablet TAKE 1 TABLET BY MOUTH ON MONDAY AND FRIDAY, AND TAKE 1/2 TABLET ALL THE OTHER DAYS   [DISCONTINUED] metoprolol tartrate (LOPRESSOR) 50 MG tablet TAKE 1/2 TABLET BY MOUTH DAILY   [DISCONTINUED] oxybutynin (DITROPAN-XL) 10 MG 24 hr tablet TAKE 1 TABLET BY MOUTH AT BEDTIME   [DISCONTINUED] SYMBICORT 80-4.5 MCG/ACT inhaler INHALE TWO PUFFS BY MOUTH TWICE DAILY (RINSE MOUTH AFTER EACH USE)   [DISCONTINUED] triamterene-hydrochlorothiazide (MAXZIDE-25) 37.5-25 MG tablet TAKE 1/2 TABLET BY MOUTH EVERY DAY   No facility-administered encounter medications on file as of 08/05/2022.    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[place]   Chronic kidney disease    Colon polyps    Diverticulitis of colon (without mention of hemorrhage)(562.11)    Diverticulosis of colon (without mention of hemorrhage)    Elevated ferritin 05/03/2019   Essential hypertension, benign    Female stress incontinence    GERD (gastroesophageal reflux disease)    Hematuria    Hernia of  unspecified site of abdominal cavity without mention of obstruction or gangrene    History of closed head injury 04/21/2018   Kidney stones    has had one    Lumbosacral spondylosis without myelopathy    Lymphedema    Meniere disease    Drs are uncertain of this DX    Migraine headache    Neuromuscular disorder (HCC)    Obesity    Other and unspecified hyperlipidemia    Oxygen deficiency    Pneumonia    Pulmonary embolism (HCC)    Seizures (HCC)    HX of --- from medications,2003   Stricture and stenosis of esophagus    Thyroid disease    Ventral hernia    Vertigo 12/12/2018   Past Surgical History:  Procedure Laterality Date   ABDOMINAL HYSTERECTOMY  1990   APPENDECTOMY     cochlar     COLON SURGERY  1997   Removed   FEMORAL VARUS OSTEOTOMY W/ ADDUCTOR RELEASE AND ILIAC CREST BONE GRAFT, PELVIC OSTEOTOMY  FEMUR FRACTURE SURGERY Left 2003   HERNIA REPAIR     with mesh, abdominal   HIP FRACTURE SURGERY Left    HUMERUS FRACTURE SURGERY Left    LUMBAR FUSION  1995   Fusion L4 - L5   PARTIAL COLECTOMY     Secondary to diverticulitis   TENDON TRANSFER     TUBAL LIGATION  1972   ULNAR NERVE REPAIR Left    Family History  Problem Relation Age of Onset   Alzheimer's disease Mother    Hypertension Mother    Stroke Mother    Alzheimer's disease Father    Hypertension Father    Gout Brother    Fibromyalgia Daughter    Hypertension Son    Depression Son    Spondylitis Son        spondylosis   GER disease Son    Heart disease Maternal Grandmother    Heart disease Maternal Grandfather    Congestive Heart Failure Maternal Grandfather    Stroke Paternal Grandmother    Hypertension Paternal Grandmother    Depression Paternal Grandfather    Suicidality Paternal Grandfather    Colon cancer Neg Hx    Esophageal cancer Neg Hx    Pancreatic cancer Neg Hx    Stomach cancer Neg Hx    Liver disease Neg Hx    Social History   Socioeconomic History   Marital status:  Widowed    Spouse name: Calissa Swenor   Number of children: 2   Years of education: Not on file   Highest education level: 12th grade  Occupational History   Occupation: Retired    Associate Professor: Nurse, [employer]    Comment: 2005    Occupation: retired  Tobacco Use   Smoking status: Never   Smokeless tobacco: Never  Vaping Use   Vaping Use: Never used  Substance and Sexual Activity   Alcohol use: Not Currently   Drug use: No   Sexual activity: Never    Birth control/protection: Post-menopausal  Other Topics Concern   Not on file  Social History Narrative   Lives alone.    Caffeine 2 cups daily    Right handed   Social Determinants of Health   Financial Resource Strain: Low Risk  (08/05/2022)   Overall Financial Resource Strain (CARDIA)    Difficulty of Paying Living Expenses: Not hard at all  Food Insecurity: No Food Insecurity (08/05/2022)   Hunger Vital Sign    Worried About Running Out of Food in the Last Year: Never true    Ran Out of Food in the Last Year: Never true  Transportation Needs: No Transportation Needs (08/05/2022)   PRAPARE - Administrator, Civil Service (Medical): No    Lack of Transportation (Non-Medical): No  Physical Activity: Insufficiently Active (08/05/2022)   Exercise Vital Sign    Days of Exercise per Week: 2 days    Minutes of Exercise per Session: 30 min  Stress: No Stress Concern Present (08/05/2022)   [employer] of Occupational Health - Occupational Stress Questionnaire    Feeling of Stress : Not at all  Social Connections: Moderately Isolated (08/05/2022)   Social Connection and Isolation Panel [NHANES]    Frequency of Communication with Friends and Family: More than three times a week    Frequency of Social Gatherings with Friends and Family: More than three times a week    Attends Religious Services: More than 4 times per year    Active Member of Clubs or  Organizations: No    Attends Banker Meetings: Never     Marital Status: Widowed    Tobacco Counseling Counseling given: Not Answered   Clinical Intake:  Pre-visit preparation completed: Yes  Pain : No/denies pain     Nutritional Risks: None Diabetes: No  How often do you need to have someone help you when you read instructions, pamphlets, or other written materials from your doctor or pharmacy?: 1 - Never  Diabetic?no   Interpreter Needed?: No  Information entered by :: Renie Ora, LPN   Activities of Daily Living    08/05/2022    3:03 PM 08/01/2022   11:45 AM  In your present state of health, do you have any difficulty performing the following activities:  Hearing? 0 1  Vision? 0 0  Difficulty concentrating or making decisions? 0 0  Walking or climbing stairs? 0 1  Dressing or bathing? 0 0  Doing errands, shopping? 0 0  Preparing Food and eating ? N N  Using the Toilet? N N  In the past six months, have you accidently leaked urine? N Y  Do you have problems with loss of bowel control? N N  Managing your Medications? N N  Managing your Finances? N N  Housekeeping or managing your Housekeeping? N N    Patient Care Team: Raliegh Ip, DO as PCP - General (Family Medicine) Wyline Mood, Dorothe Pea, MD as PCP - Cardiology (Cardiology) Daisy Lazar, DO (Optometry) Beverely Low, MD as Consulting Physician (Orthopedic Surgery) Rollene Rotunda, MD as Consulting Physician (Cardiology) Keturah Barre, MD as Consulting Physician (Otolaryngology) Memory Argue Wilson Singer, MD as Referring Physician Sharman Cheek, DMD (Dentistry) Ermalinda Barrios, MD as Consulting Physician (Otolaryngology) Meryl Dare, MD as Consulting Physician (Gastroenterology) Janalyn Harder, MD (Inactive) as Consulting Physician (Dermatology)  Indicate any recent Medical Services you may have received from other than [facility] providers in the past year (date may be approximate).     Assessment:   This is a routine wellness examination for  [employer].  Hearing/Vision screen Vision Screening - Comments:: Wears rx glasses - up to date with routine eye exams with  Dr.Cotter  Dietary issues and exercise activities discussed: Current Exercise Habits: Home exercise routine, Type of exercise: walking, Time (Minutes): 30, Frequency (Times/Week): 2, Weekly Exercise (Minutes/Week): 60, Intensity: Mild, Exercise limited by: respiratory conditions(s)   Goals Addressed             This Visit's Progress    AWV   On track    05/31/2019 AWV Goal: Exercise for General Health  Patient will verbalize understanding of the benefits of increased physical activity: Exercising regularly is important. It will improve your overall fitness, flexibility, and endurance. Regular exercise also will improve your overall health. It can help you control your weight, reduce stress, and improve your bone density. Over the next year, patient will increase physical activity as tolerated with a goal of at least 150 minutes of moderate physical activity per week.  You can tell that you are exercising at a moderate intensity if your heart starts beating faster and you start breathing faster but can still hold a conversation. Moderate-intensity exercise ideas include: Walking 1 mile (1.6 km) in about 15 minutes Biking Hiking Golfing Dancing Water aerobics Patient will verbalize understanding of everyday activities that increase physical activity by providing examples like the following: Yard work, such as: Production designer, theatre/television/film windows  or floors Patient will be able to explain general safety guidelines for exercising:  Before you start a new exercise program, talk with your health care provider. Do not exercise so much that you hurt yourself, feel dizzy, or get very short of breath. Wear comfortable clothes and wear shoes with good support. Drink plenty of water  while you exercise to prevent dehydration or heat stroke. Work out until your breathing and your heartbeat get faster.        Depression Screen    08/05/2022    3:02 PM 04/26/2022   12:59 PM 01/23/2022    8:07 AM 07/30/2021    2:58 PM 07/02/2021    8:31 AM 01/02/2021    2:30 PM 07/28/2020    2:25 PM  PHQ 2/9 Scores  PHQ - 2 Score 0 0 0 0 0 0 0  PHQ- 9 Score  2    2 0    Fall Risk    08/05/2022    3:00 PM 08/01/2022   11:45 AM 04/26/2022   12:59 PM 01/23/2022    8:07 AM 07/30/2021    3:07 PM  Fall Risk   Falls in the past year? 0 0 0 0 0  Number falls in past yr: 0  0  0  Injury with Fall? 0 0 0  0  Risk for fall due to : No Fall Risks  No Fall Risks  No Fall Risks  Follow up Falls prevention discussed  Education provided  Falls prevention discussed    FALL RISK PREVENTION PERTAINING TO THE HOME:  Any stairs in or around the home? No  If so, are there any without handrails? No  Home free of loose throw rugs in walkways, pet beds, electrical cords, etc? Yes  Adequate lighting in your home to reduce risk of falls? Yes   ASSISTIVE DEVICES UTILIZED TO PREVENT FALLS:  Life alert? No  Use of a cane, walker or w/c? Yes  Grab bars in the bathroom? Yes  Shower chair or bench in shower? Yes  Elevated toilet seat or a handicapped toilet? Yes       08/04/2017    9:24 AM 07/23/2016    2:26 PM  MMSE - Mini Mental State Exam  Orientation to time 5 5  Orientation to Place 5 5  Registration 3 3  Attention/ Calculation 5 5  Recall 3 3  Language- name 2 objects 2 2  Language- repeat 1 1  Language- follow 3 step command 3 3  Language- read & follow direction 1 1  Write a sentence 1 1  Copy design 1 1  Total score 30 30        08/05/2022    3:03 PM 07/30/2021    3:10 PM 05/31/2019    8:48 AM  6CIT Screen  What Year? 0 points 0 points 0 points  What month? 0 points 0 points 0 points  What time? 0 points 0 points 0 points  Count back from 20 0 points 0 points 0 points   Months in reverse 0 points 0 points 2 points  Repeat phrase 0 points 0 points 0 points  Total Score 0 points 0 points 2 points    Immunizations Immunization History  Administered Date(s) Administered   Moderna Sars-Covid-2 Vaccination 07/09/2019, 08/10/2019, 04/21/2020   Pneumococcal Conjugate-13 01/14/2014   Pneumococcal Polysaccharide-23 03/06/2015   Td 04/08/2001, 01/23/2022   Td (Adult),5 Lf Tetanus Toxid, Preservative Free 04/08/2001   Tdap 01/07/2011    TDAP status:  Up to date  Flu Vaccine status: Up to date  Pneumococcal vaccine status: Up to date  Covid-19 vaccine status: Completed vaccines  Qualifies for Shingles Vaccine? Yes   Zostavax completed No   Shingrix Completed?: No.    Education has been provided regarding the importance of this vaccine. Patient has been advised to call insurance company to determine out of pocket expense if they have not yet received this vaccine. Advised may also receive vaccine at local pharmacy or Health Dept. Verbalized acceptance and understanding.  Screening Tests Health Maintenance  Topic Date Due   Zoster Vaccines- Shingrix (1 of 2) Never done   COVID-19 Vaccine (4 - 2023-24 season) 12/07/2021   COLONOSCOPY (Pts 45-26yrs Insurance coverage will need to be confirmed)  04/27/2023 (Originally 02/04/2022)   INFLUENZA VACCINE  11/07/2022   MAMMOGRAM  11/07/2022   DEXA SCAN  01/10/2023   Medicare Annual Wellness (AWV)  08/05/2023   DTaP/Tdap/Td (4 - Td or Tdap) 01/24/2032   Pneumonia Vaccine 83+ Years old  Completed   Hepatitis C Screening  Completed   HPV VACCINES  Aged Out    Health Maintenance  Health Maintenance Due  Topic Date Due   Zoster Vaccines- Shingrix (1 of 2) Never done   COVID-19 Vaccine (4 - 2023-24 season) 12/07/2021    Colorectal cancer screening: Referral to GI placed per patient delayed per Dr.Stark till 01/2024. Pt aware the office will call re: appt.  Mammogram status: Completed 11/06/2021. Repeat  every year  Bone Density status: Completed 01/09/2021. Results reflect: Bone density results: OSTEOPOROSIS. Repeat every 2 years.  Lung Cancer Screening: (Low Dose CT Chest recommended if Age 48-80 years, 30 pack-year currently smoking OR have quit w/in 15years.) does not qualify.   Lung Cancer Screening Referral: n/a  Additional Screening:  Hepatitis C Screening: does not qualify; Completed 12/31/2016  Vision Screening: Recommended annual ophthalmology exams for early detection of glaucoma and other disorders of the eye. Is the patient up to date with their annual eye exam?  Yes  Who is the provider or what is the name of the office in which the patient attends annual eye exams? Dr.Cotter  If pt is not established with a provider, would they like to be referred to a provider to establish care? No .   Dental Screening: Recommended annual dental exams for proper oral hygiene  Community Resource Referral / Chronic Care Management: CRR required this visit?  No   CCM required this visit?  No      Plan:     I have personally reviewed and noted the following in the patient's chart:   Medical and social history Use of alcohol, tobacco or illicit drugs  Current medications and supplements including opioid prescriptions. Patient is not currently taking opioid prescriptions. Functional ability and status Nutritional status Physical activity Advanced directives List of other physicians Hospitalizations, surgeries, and ER visits in previous 12 months Vitals Screenings to include cognitive, depression, and falls Referrals and appointments  In addition, I have reviewed and discussed with patient certain preventive protocols, quality metrics, and best practice recommendations. A written personalized care plan for preventive services as well as general preventive health recommendations were provided to patient.     Lorrene Reid, LPN   [id number]/61/0960   Nurse Notes: none

## 2022-08-15 ENCOUNTER — Other Ambulatory Visit: Payer: Self-pay | Admitting: Family Medicine

## 2022-08-15 DIAGNOSIS — I10 Essential (primary) hypertension: Secondary | ICD-10-CM

## 2022-08-15 DIAGNOSIS — R600 Localized edema: Secondary | ICD-10-CM

## 2022-08-18 DIAGNOSIS — U071 COVID-19: Secondary | ICD-10-CM | POA: Diagnosis not present

## 2022-08-18 DIAGNOSIS — J1282 Pneumonia due to coronavirus disease 2019: Secondary | ICD-10-CM | POA: Diagnosis not present

## 2022-08-22 DIAGNOSIS — M25561 Pain in right knee: Secondary | ICD-10-CM | POA: Diagnosis not present

## 2022-08-27 ENCOUNTER — Other Ambulatory Visit: Payer: Self-pay | Admitting: Family Medicine

## 2022-08-27 DIAGNOSIS — R0602 Shortness of breath: Secondary | ICD-10-CM

## 2022-09-09 DIAGNOSIS — M25561 Pain in right knee: Secondary | ICD-10-CM | POA: Diagnosis not present

## 2022-09-18 DIAGNOSIS — J1282 Pneumonia due to coronavirus disease 2019: Secondary | ICD-10-CM | POA: Diagnosis not present

## 2022-09-18 DIAGNOSIS — U071 COVID-19: Secondary | ICD-10-CM | POA: Diagnosis not present

## 2022-10-15 ENCOUNTER — Other Ambulatory Visit: Payer: Self-pay | Admitting: Family Medicine

## 2022-10-15 DIAGNOSIS — E034 Atrophy of thyroid (acquired): Secondary | ICD-10-CM

## 2022-10-18 DIAGNOSIS — J1282 Pneumonia due to coronavirus disease 2019: Secondary | ICD-10-CM | POA: Diagnosis not present

## 2022-10-18 DIAGNOSIS — U071 COVID-19: Secondary | ICD-10-CM | POA: Diagnosis not present

## 2022-11-12 DIAGNOSIS — Z1231 Encounter for screening mammogram for malignant neoplasm of breast: Secondary | ICD-10-CM | POA: Diagnosis not present

## 2022-11-18 DIAGNOSIS — J1282 Pneumonia due to coronavirus disease 2019: Secondary | ICD-10-CM | POA: Diagnosis not present

## 2022-11-18 DIAGNOSIS — U071 COVID-19: Secondary | ICD-10-CM | POA: Diagnosis not present

## 2022-11-26 ENCOUNTER — Encounter: Payer: Self-pay | Admitting: Family Medicine

## 2022-11-27 DIAGNOSIS — U071 COVID-19: Secondary | ICD-10-CM | POA: Diagnosis not present

## 2022-11-27 DIAGNOSIS — J1282 Pneumonia due to coronavirus disease 2019: Secondary | ICD-10-CM | POA: Diagnosis not present

## 2022-12-10 ENCOUNTER — Other Ambulatory Visit: Payer: Self-pay | Admitting: Family Medicine

## 2022-12-10 DIAGNOSIS — R0602 Shortness of breath: Secondary | ICD-10-CM

## 2023-01-05 ENCOUNTER — Other Ambulatory Visit: Payer: Self-pay | Admitting: Family Medicine

## 2023-01-05 DIAGNOSIS — E559 Vitamin D deficiency, unspecified: Secondary | ICD-10-CM

## 2023-01-07 ENCOUNTER — Other Ambulatory Visit: Payer: Self-pay | Admitting: Family Medicine

## 2023-01-07 DIAGNOSIS — R0602 Shortness of breath: Secondary | ICD-10-CM

## 2023-01-11 ENCOUNTER — Other Ambulatory Visit: Payer: Self-pay | Admitting: Family Medicine

## 2023-01-11 DIAGNOSIS — E7800 Pure hypercholesterolemia, unspecified: Secondary | ICD-10-CM

## 2023-01-12 ENCOUNTER — Other Ambulatory Visit: Payer: Self-pay | Admitting: Family Medicine

## 2023-01-12 DIAGNOSIS — E034 Atrophy of thyroid (acquired): Secondary | ICD-10-CM

## 2023-01-13 ENCOUNTER — Other Ambulatory Visit: Payer: Medicare Other

## 2023-01-13 ENCOUNTER — Encounter: Payer: Self-pay | Admitting: Family Medicine

## 2023-01-13 ENCOUNTER — Ambulatory Visit (INDEPENDENT_AMBULATORY_CARE_PROVIDER_SITE_OTHER): Payer: Medicare Other

## 2023-01-13 ENCOUNTER — Ambulatory Visit (INDEPENDENT_AMBULATORY_CARE_PROVIDER_SITE_OTHER): Payer: Medicare Other | Admitting: Family Medicine

## 2023-01-13 VITALS — BP 135/84 | HR 64 | Temp 98.5°F | Ht 64.0 in | Wt 196.0 lb

## 2023-01-13 DIAGNOSIS — M85831 Other specified disorders of bone density and structure, right forearm: Secondary | ICD-10-CM

## 2023-01-13 DIAGNOSIS — G8929 Other chronic pain: Secondary | ICD-10-CM | POA: Diagnosis not present

## 2023-01-13 DIAGNOSIS — E7800 Pure hypercholesterolemia, unspecified: Secondary | ICD-10-CM | POA: Diagnosis not present

## 2023-01-13 DIAGNOSIS — M25551 Pain in right hip: Secondary | ICD-10-CM

## 2023-01-13 DIAGNOSIS — M7989 Other specified soft tissue disorders: Secondary | ICD-10-CM | POA: Diagnosis not present

## 2023-01-13 DIAGNOSIS — B370 Candidal stomatitis: Secondary | ICD-10-CM

## 2023-01-13 DIAGNOSIS — R233 Spontaneous ecchymoses: Secondary | ICD-10-CM | POA: Diagnosis not present

## 2023-01-13 DIAGNOSIS — M47817 Spondylosis without myelopathy or radiculopathy, lumbosacral region: Secondary | ICD-10-CM | POA: Diagnosis not present

## 2023-01-13 DIAGNOSIS — E034 Atrophy of thyroid (acquired): Secondary | ICD-10-CM | POA: Diagnosis not present

## 2023-01-13 MED ORDER — HYDROCODONE-ACETAMINOPHEN 5-325 MG PO TABS: 1.0000 | ORAL_TABLET | Freq: Every day | ORAL | 0 refills | Status: DC | PRN

## 2023-01-13 MED ORDER — EZETIMIBE 10 MG PO TABS
10.0000 mg | ORAL_TABLET | Freq: Every day | ORAL | 3 refills | Status: DC
Start: 2023-01-13 — End: 2024-01-14

## 2023-01-13 MED ORDER — PRAVASTATIN SODIUM 20 MG PO TABS
20.0000 mg | ORAL_TABLET | Freq: Every day | ORAL | 3 refills | Status: DC
Start: 2023-01-13 — End: 2023-12-10

## 2023-01-13 MED ORDER — LEVOTHYROXINE SODIUM 100 MCG PO TABS: ORAL_TABLET | ORAL | 4 refills | Status: DC

## 2023-01-13 MED ORDER — NYSTATIN 100000 UNIT/ML MT SUSP
5.0000 mL | Freq: Four times a day (QID) | OROMUCOSAL | 0 refills | Status: DC | PRN
Start: 2023-01-13 — End: 2024-01-13

## 2023-01-13 NOTE — Progress Notes (Signed)
Subjective: CC: Chronic follow-up, lipoma PCP: Raliegh Ip, DO NWG:NFAO E Apple is a 72 y.o. female presenting to clinic today for:  1.  Lipoma Patient reports that she has had a soft tissue mass along the right anterior chest just below the shoulder.  This seems to be getting a little larger and is becoming more irritating as it is right below her bra strap.  She has had to switch to sports bras in efforts to alleviate irritation.  Reports no overt pain, erythema, warmth or drainage.  Will be seeing dermatology in December for other skin lesions but wanted to have this evaluated sooner  2.  Osteoarthritis She has known osteoarthritis in bilateral knees, reporting right worse than left.  She is status post corticosteroid injection with [facility] into the right knee and this did help.  Continues to use Norco extremely sparingly.  She takes Colace when she does take the medicine to avoid any GI issues.  She denies any excessive daytime sedation, falls or respiratory depression.  Needs refills on Norco  3.  Hypothyroidism Compliant with thyroid medicine.  No reports of tremor, heart palpitations, changes in bowel movements or weight   ROS: Per HPI  Allergies  Allergen Reactions   Ambien [Zolpidem Tartrate] Anxiety   Diovan [Valsartan] Other (See Comments)    Seizures.   Wellbutrin [Bupropion] Other (See Comments)    Seizures.   Codeine     Feel crazy and dizzy    Doxycycline Other (See Comments)    conjunctivitis   Feldene [Piroxicam]     Swelling    Guaifenesin Er     Raise Bp / HR    Latex     Swelling    Mobic [Meloxicam] Other (See Comments)    Stomach cramps   Neurontin [Gabapentin]     "loopy"   Crestor [Rosuvastatin Calcium] Other (See Comments)    Myalgias    Lipitor [Atorvastatin Calcium] Other (See Comments)    myalgias   Livalo [Pitavastatin] Swelling and Other (See Comments)    stomach cramping    Simcor [Niacin-Simvastatin Er] Other (See  Comments)    Stomach cramps   Zocor [Simvastatin] Other (See Comments)    Stomach cramps   Past Medical History:  Diagnosis Date   Allergy    SEASONAL   Benign neoplasm of colon    Blood transfusion without reported diagnosis    x 34 to date   Cancer (HCC)    MELONAMA left foreham   Chronic kidney disease    Colon polyps    Diverticulitis of colon (without mention of hemorrhage)(562.11)    Diverticulosis of colon (without mention of hemorrhage)    Elevated ferritin 05/03/2019   Essential hypertension, benign    Female stress incontinence    GERD (gastroesophageal reflux disease)    Hematuria    Hernia of unspecified site of abdominal cavity without mention of obstruction or gangrene    History of closed head injury 04/21/2018   Kidney stones    has had one    Lumbosacral spondylosis without myelopathy    Lymphedema    Meniere disease    Drs are uncertain of this DX    Migraine headache    Neuromuscular disorder (HCC)    Obesity    Other and unspecified hyperlipidemia    Oxygen deficiency    Pneumonia    Pulmonary embolism (HCC)    Seizures (HCC)    HX of --- from medications,2003   Stricture and stenosis  of esophagus    Thyroid disease    Ventral hernia    Vertigo 12/12/2018    Current Outpatient Medications:    albuterol (PROVENTIL) (2.5 MG/3ML) 0.083% nebulizer solution, Take 3 mLs (2.5 mg total) by nebulization every 6 (six) hours as needed for wheezing or shortness of breath., Disp: 150 mL, Rfl: 1   albuterol (VENTOLIN HFA) 108 (90 Base) MCG/ACT inhaler, INHALE TWO PUFFS BY MOUTH EVERY 6 HOURS AS NEEDED FOR WHEEZING OR SHORTNESS OF BREATH, Disp: 8.5 g, Rfl: 0   budesonide-formoterol (SYMBICORT) 80-4.5 MCG/ACT inhaler, INHALE TWO PUFFS BY MOUTH TWICE DAILY (RINSE MOUTH AFTER EACH USE), Disp: 10.2 g, Rfl: 0   calcium-vitamin D (OSCAL WITH D) 500-200 MG-UNIT tablet, Take 1 tablet by mouth daily with breakfast., Disp: 90 tablet, Rfl: 1   cycloSPORINE (RESTASIS) 0.05  % ophthalmic emulsion, , Disp: , Rfl:    esomeprazole (NEXIUM) 40 MG capsule, Take 1 capsule (40 mg total) by mouth 2 (two) times daily before a meal., Disp: 60 capsule, Rfl: 3   ezetimibe (ZETIA) 10 MG tablet, Take 1 tablet (10 mg total) by mouth daily., Disp: 90 tablet, Rfl: 3   fluticasone (FLONASE) 50 MCG/ACT nasal spray, Place 2 sprays into both nostrils daily., Disp: 16 g, Rfl: 6   HYDROcodone-acetaminophen (NORCO/VICODIN) 5-325 MG tablet, Take 1 tablet by mouth daily as needed for moderate pain or severe pain., Disp: 30 tablet, Rfl: 0   levothyroxine (SYNTHROID) 100 MCG tablet, TAKE 1 TABLET BY MOUTH ON MONDAYS AND FRIDAYS, AND TAKE 1/2 TABLET ALL OTHER DAYS, Disp: 90 tablet, Rfl: 0   meclizine (ANTIVERT) 25 MG tablet, Take 1 tablet (25 mg total) by mouth every 8 (eight) hours as needed for dizziness., Disp: 30 tablet, Rfl: 2   metoprolol tartrate (LOPRESSOR) 50 MG tablet, TAKE 1/2 TABLET BY MOUTH DAILY, Disp: 45 tablet, Rfl: 3   Multiple Vitamins-Minerals (WOMENS 50+ ADVANCED PO), Take by mouth., Disp: , Rfl:    oxybutynin (DITROPAN-XL) 10 MG 24 hr tablet, Take 1 tablet (10 mg total) by mouth at bedtime., Disp: 90 tablet, Rfl: 3   pravastatin (PRAVACHOL) 20 MG tablet, Take 1 tablet (20 mg total) by mouth daily. Could not tolerate 40mg , Disp: 90 tablet, Rfl: 3   triamterene-hydrochlorothiazide (MAXZIDE-25) 37.5-25 MG tablet, Take 0.5 tablets by mouth daily., Disp: 45 tablet, Rfl: 3   Vitamin D, Ergocalciferol, (DRISDOL) 1.25 MG (50000 UNIT) CAPS capsule, TAKE ONE CAPSULE BY MOUTH EVERY SEVEN DAYS, Disp: 12 capsule, Rfl: 3 Social History   Socioeconomic History   Marital status: Widowed    Spouse name: Aldona Bryner   Number of children: 2   Years of education: Not on file   Highest education level: 12th grade  Occupational History   Occupation: Retired    Associate Professor: Nurse, [employer]    Comment: 2005    Occupation: retired  Tobacco Use   Smoking status: Never   Smokeless tobacco:  Never  Vaping Use   Vaping status: Never Used  Substance and Sexual Activity   Alcohol use: Not Currently   Drug use: No   Sexual activity: Never    Birth control/protection: Post-menopausal  Other Topics Concern   Not on file  Social History Narrative   Lives alone.    Caffeine 2 cups daily    Right handed   Social Determinants of Health   Financial Resource Strain: Low Risk  (08/05/2022)   Overall Financial Resource Strain (CARDIA)    Difficulty of Paying Living Expenses: Not hard  at all  Food Insecurity: No Food Insecurity (08/05/2022)   Hunger Vital Sign    Worried About Running Out of Food in the Last Year: Never true    Ran Out of Food in the Last Year: Never true  Transportation Needs: No Transportation Needs (08/05/2022)   PRAPARE - Administrator, Civil Service (Medical): No    Lack of Transportation (Non-Medical): No  Physical Activity: Insufficiently Active (08/05/2022)   Exercise Vital Sign    Days of Exercise per Week: 2 days    Minutes of Exercise per Session: 30 min  Stress: No Stress Concern Present (08/05/2022)   [employer] of Occupational Health - Occupational Stress Questionnaire    Feeling of Stress : Not at all  Social Connections: Moderately Isolated (08/05/2022)   Social Connection and Isolation Panel [NHANES]    Frequency of Communication with Friends and Family: More than three times a week    Frequency of Social Gatherings with Friends and Family: More than three times a week    Attends Religious Services: More than 4 times per year    Active Member of [employer] or Organizations: No    Attends Banker Meetings: Never    Marital Status: Widowed  Intimate Partner Violence: Not At Risk (08/05/2022)   Humiliation, Afraid, Rape, and Kick questionnaire    Fear of Current or Ex-Partner: No    Emotionally Abused: No    Physically Abused: No    Sexually Abused: No   Family History  Problem Relation Age of Onset   Alzheimer's  disease Mother    Hypertension Mother    Stroke Mother    Alzheimer's disease Father    Hypertension Father    Gout Brother    Fibromyalgia Daughter    Hypertension Son    Depression Son    Spondylitis Son        spondylosis   GER disease Son    Heart disease Maternal Grandmother    Heart disease Maternal Grandfather    Congestive Heart Failure Maternal Grandfather    Stroke Paternal Grandmother    Hypertension Paternal Grandmother    Depression Paternal Grandfather    Suicidality Paternal Grandfather    Colon cancer Neg Hx    Esophageal cancer Neg Hx    Pancreatic cancer Neg Hx    Stomach cancer Neg Hx    Liver disease Neg Hx     Objective: Office vital signs reviewed. BP 135/84   Pulse 64   Temp 98.5 F (36.9 C)   Ht 5\' 4"  (1.626 m)   Wt 196 lb (88.9 kg)   SpO2 95%   BMI 33.64 kg/m   Physical Examination:  General: Awake, alert, well nourished, No acute distress HEENT: sclera white, MMM.  No exophthalmos.  No goiter Cardio: regular rate and rhythm, S1S2 heard, no murmurs appreciated Pulm: clear to auscultation bilaterally, no wheezes, rhonchi or rales; normal work of breathing on room air MSK: Ambulating independently station.  Has all the arthritic changes to bilateral knees left greater than right Skin: Golf ball sized soft tissue mass that is rubbery and well-circumscribed.  Mobile.  No tenderness, erythema, warmth or central punctum appreciated.  This is located at roughly the right upper chest just below the anterior shoulder underneath her bra line.  Has some senile purpura noted along the forearms  Assessment/ Plan: 72 y.o. female   Hypothyroidism due to acquired atrophy of thyroid - Plan: CMP14+EGFR, TSH, T4, Free, levothyroxine (SYNTHROID) 100 MCG  tablet  Chronic right hip pain - Plan: HYDROcodone-acetaminophen (NORCO/VICODIN) 5-325 MG tablet  Lumbosacral spondylosis without myelopathy - Plan: HYDROcodone-acetaminophen (NORCO/VICODIN) 5-325 MG  tablet  Osteopenia of right forearm - Plan: DG [facility] DEXA  Easy bruisability - Plan: CBC with Differential  Pure hypercholesterolemia - Plan: Lipid Panel, ezetimibe (ZETIA) 10 MG tablet, pravastatin (PRAVACHOL) 20 MG tablet  Mass of soft tissue of chest - Plan: [place] CHEST SOFT TISSUE  Thrush - Plan: nystatin (MYCOSTATIN) 100000 UNIT/ML suspension  Check thyroid levels.  Asymptomatic from a thyroid standpoint  Norco renewed.  She is up-to-date on UDS and CSC.  National narcotic database reviewed and there were no red flags  DEXA scan performed today.  CBC collected given easy bruisability.  Suspect secondary to thinning skin but will evaluate for other causes  Fasting labs collected today.  Continue lipid-lowering medications  Ultrasound of what appears to be a lipoma.  I anticipate this could be excised by dermatology but asked that she contact me if she needs referral to general surgeon.  Will plan for Dr. Henreitta Leber if needed  I gave her a prescription for nystatin to swish and spit if needed for thrush caused by inhaled corticosteroid   Raliegh Ip, DO [facility] [phone]

## 2023-01-13 NOTE — Patient Instructions (Addendum)
You had labs performed today.  You will be contacted with the results of the labs once they are available, usually in the next 3 business days for routine lab work.  If you have an active my chart account, they will be released to your MyChart.  If you prefer to have these labs released to you via telephone, please let us know.  Lipoma  A lipoma is a noncancerous (benign) tumor that is made up of fat cells. This is a very common type of soft-tissue growth. Lipomas are usually found under the skin (subcutaneous). They may occur in any tissue of the body that contains fat. Common areas for lipomas to appear include the back, arms, shoulders, buttocks, and thighs. Lipomas grow slowly, and they are usually painless. Most lipomas do not cause problems and do not require treatment. What are the causes? The cause of this condition is not known. What increases the risk? You are more likely to develop this condition if: You are 57-40 years old. You have a family history of lipomas. What are the signs or symptoms? A lipoma usually appears as a small, round bump under the skin. In most cases, the lump will: Feel soft or rubbery. Not cause pain or other symptoms. However, if a lipoma is located in an area where it pushes on nerves, it can become painful or cause other symptoms. How is this diagnosed? A lipoma can usually be diagnosed with a physical exam. You may also have tests to confirm the diagnosis and to rule out other conditions. Tests may include: Imaging tests, such as a CT scan or an MRI. Removal of a tissue sample to be looked at under a microscope (biopsy). How is this treated? Treatment for this condition depends on the size of the lipoma and whether it is causing any symptoms. For small lipomas that are not causing problems, no treatment is needed. If a lipoma is bigger or it causes problems, surgery may be done to remove the lipoma. Lipomas can also be removed to improve appearance. Most  often, the procedure is done after applying a medicine that numbs the area (local anesthetic). Liposuction may be done to reduce the size of the lipoma before it is removed through surgery, or it may be done to remove the lipoma. Lipomas are removed with this method to limit incision size and scarring. A liposuction tube is inserted through a small incision into the lipoma, and the contents of the lipoma are removed through the tube with suction. Follow these instructions at home: Watch your lipoma for any changes. Keep all follow-up visits. This is important. Where to find more information OrthoInfo: orthoinfo.aaos.org Contact a health care provider if: Your lipoma becomes larger or hard. Your lipoma becomes painful, red, or increasingly swollen. These could be signs of infection or a more serious condition. Get help right away if: You develop tingling or numbness in an area near the lipoma. This could indicate that the lipoma is causing nerve damage. Summary A lipoma is a noncancerous tumor that is made up of fat cells. Most lipomas do not cause problems and do not require treatment. If a lipoma is bigger or it causes problems, surgery may be done to remove the lipoma. Contact a health care provider if your lipoma becomes larger or hard, or if it becomes painful, red, or increasingly swollen. These could be signs of infection or a more serious condition. This information is not intended to replace advice given to you by your health  care provider. Make sure you discuss any questions you have with your health care provider. Document Revised: 04/13/2021 Document Reviewed: 04/13/2021 Elsevier Patient Education  2024 [employer].

## 2023-01-14 LAB — CBC WITH DIFFERENTIAL/PLATELET
Basophils Absolute: 0 10*3/uL (ref 0.0–0.2)
Basos: 1 %
EOS (ABSOLUTE): 0.3 10*3/uL (ref 0.0–0.4)
Eos: 6 %
Hematocrit: 41.3 % (ref 34.0–46.6)
Hemoglobin: 13.7 g/dL (ref 11.1–15.9)
Immature Grans (Abs): 0 10*3/uL (ref 0.0–0.1)
Immature Granulocytes: 0 %
Lymphocytes Absolute: 1.3 10*3/uL (ref 0.7–3.1)
Lymphs: 23 %
MCH: 29 pg (ref 26.6–33.0)
MCHC: 33.2 g/dL (ref 31.5–35.7)
MCV: 88 fL (ref 79–97)
Monocytes Absolute: 0.5 10*3/uL (ref 0.1–0.9)
Monocytes: 9 %
Neutrophils Absolute: 3.6 10*3/uL (ref 1.4–7.0)
Neutrophils: 61 %
Platelets: 217 10*3/uL (ref 150–450)
RBC: 4.72 x10E6/uL (ref 3.77–5.28)
RDW: 12.7 % (ref 11.7–15.4)
WBC: 5.8 10*3/uL (ref 3.4–10.8)

## 2023-01-14 LAB — CMP14+EGFR
ALT: 15 [IU]/L (ref 0–32)
AST: 21 [IU]/L (ref 0–40)
Albumin: 4.3 g/dL (ref 3.8–4.8)
Alkaline Phosphatase: 100 [IU]/L (ref 44–121)
BUN/Creatinine Ratio: 16 (ref 12–28)
BUN: 14 mg/dL (ref 8–27)
Bilirubin Total: 0.7 mg/dL (ref 0.0–1.2)
CO2: 25 mmol/L (ref 20–29)
Calcium: 9.8 mg/dL (ref 8.7–10.3)
Chloride: 104 mmol/L (ref 96–106)
Creatinine, Ser: 0.89 mg/dL (ref 0.57–1.00)
Globulin, Total: 1.7 g/dL (ref 1.5–4.5)
Glucose: 87 mg/dL (ref 70–99)
Potassium: 4 mmol/L (ref 3.5–5.2)
Sodium: 143 mmol/L (ref 134–144)
Total Protein: 6 g/dL (ref 6.0–8.5)
eGFR: 68 mL/min/{1.73_m2} (ref >59)

## 2023-01-14 LAB — LIPID PANEL
Chol/HDL Ratio: 2.5 {ratio} (ref 0.0–4.4)
Cholesterol, Total: 154 mg/dL (ref 100–199)
HDL: 62 mg/dL (ref >39)
LDL Chol Calc (NIH): 75 mg/dL (ref 0–99)
Triglycerides: 93 mg/dL (ref 0–149)
VLDL Cholesterol Cal: 17 mg/dL (ref 5–40)

## 2023-01-14 LAB — T4, FREE: Free T4: 1.4 ng/dL (ref 0.82–1.77)

## 2023-01-14 LAB — TSH: TSH: 1.7 u[IU]/mL (ref 0.450–4.500)

## 2023-01-17 ENCOUNTER — Ambulatory Visit (HOSPITAL_COMMUNITY)
Admission: RE | Admit: 2023-01-17 | Discharge: 2023-01-17 | Disposition: A | Discharge status: Home / Self Care | Payer: Medicare Other | Source: Ambulatory Visit | Attending: Family Medicine | Admitting: Family Medicine

## 2023-01-17 DIAGNOSIS — M7989 Other specified soft tissue disorders: Secondary | ICD-10-CM | POA: Insufficient documentation

## 2023-01-17 DIAGNOSIS — R2231 Localized swelling, mass and lump, right upper limb: Secondary | ICD-10-CM | POA: Diagnosis not present

## 2023-01-30 DIAGNOSIS — Z780 Asymptomatic menopausal state: Secondary | ICD-10-CM | POA: Diagnosis not present

## 2023-01-30 DIAGNOSIS — M8589 Other specified disorders of bone density and structure, multiple sites: Secondary | ICD-10-CM | POA: Diagnosis not present

## 2023-02-05 ENCOUNTER — Other Ambulatory Visit: Payer: Self-pay | Admitting: Family Medicine

## 2023-02-05 DIAGNOSIS — R0602 Shortness of breath: Secondary | ICD-10-CM

## 2023-03-20 DIAGNOSIS — L821 Other seborrheic keratosis: Secondary | ICD-10-CM | POA: Diagnosis not present

## 2023-03-20 DIAGNOSIS — Z85820 Personal history of malignant melanoma of skin: Secondary | ICD-10-CM | POA: Diagnosis not present

## 2023-03-20 DIAGNOSIS — L570 Actinic keratosis: Secondary | ICD-10-CM | POA: Diagnosis not present

## 2023-04-11 ENCOUNTER — Other Ambulatory Visit: Payer: Self-pay | Admitting: Family Medicine

## 2023-04-11 DIAGNOSIS — J9601 Acute respiratory failure with hypoxia: Secondary | ICD-10-CM

## 2023-04-28 DIAGNOSIS — L814 Other melanin hyperpigmentation: Secondary | ICD-10-CM | POA: Diagnosis not present

## 2023-04-28 DIAGNOSIS — D179 Benign lipomatous neoplasm, unspecified: Secondary | ICD-10-CM | POA: Diagnosis not present

## 2023-04-28 DIAGNOSIS — D229 Melanocytic nevi, unspecified: Secondary | ICD-10-CM | POA: Diagnosis not present

## 2023-04-28 DIAGNOSIS — L821 Other seborrheic keratosis: Secondary | ICD-10-CM | POA: Diagnosis not present

## 2023-04-28 DIAGNOSIS — L570 Actinic keratosis: Secondary | ICD-10-CM | POA: Diagnosis not present

## 2023-04-28 DIAGNOSIS — L578 Other skin changes due to chronic exposure to nonionizing radiation: Secondary | ICD-10-CM | POA: Diagnosis not present

## 2023-07-14 ENCOUNTER — Ambulatory Visit: Payer: Medicare Other | Admitting: Family Medicine

## 2023-07-14 ENCOUNTER — Encounter: Payer: Self-pay | Admitting: Family Medicine

## 2023-07-14 VITALS — BP 105/70 | HR 65 | Temp 98.3°F | Ht 64.0 in | Wt 198.0 lb

## 2023-07-14 DIAGNOSIS — E7800 Pure hypercholesterolemia, unspecified: Secondary | ICD-10-CM | POA: Diagnosis not present

## 2023-07-14 DIAGNOSIS — Z0001 Encounter for general adult medical examination with abnormal findings: Secondary | ICD-10-CM | POA: Diagnosis not present

## 2023-07-14 DIAGNOSIS — M47817 Spondylosis without myelopathy or radiculopathy, lumbosacral region: Secondary | ICD-10-CM

## 2023-07-14 DIAGNOSIS — M2550 Pain in unspecified joint: Secondary | ICD-10-CM

## 2023-07-14 DIAGNOSIS — M353 Polymyalgia rheumatica: Secondary | ICD-10-CM

## 2023-07-14 DIAGNOSIS — N3000 Acute cystitis without hematuria: Secondary | ICD-10-CM | POA: Diagnosis not present

## 2023-07-14 DIAGNOSIS — R0602 Shortness of breath: Secondary | ICD-10-CM | POA: Diagnosis not present

## 2023-07-14 DIAGNOSIS — I890 Lymphedema, not elsewhere classified: Secondary | ICD-10-CM

## 2023-07-14 DIAGNOSIS — M85831 Other specified disorders of bone density and structure, right forearm: Secondary | ICD-10-CM | POA: Diagnosis not present

## 2023-07-14 DIAGNOSIS — Z0000 Encounter for general adult medical examination without abnormal findings: Secondary | ICD-10-CM

## 2023-07-14 DIAGNOSIS — Z6833 Body mass index (BMI) 33.0-33.9, adult: Secondary | ICD-10-CM

## 2023-07-14 DIAGNOSIS — I10 Essential (primary) hypertension: Secondary | ICD-10-CM | POA: Diagnosis not present

## 2023-07-14 DIAGNOSIS — R7309 Other abnormal glucose: Secondary | ICD-10-CM | POA: Diagnosis not present

## 2023-07-14 DIAGNOSIS — M25541 Pain in joints of right hand: Secondary | ICD-10-CM | POA: Diagnosis not present

## 2023-07-14 DIAGNOSIS — E559 Vitamin D deficiency, unspecified: Secondary | ICD-10-CM

## 2023-07-14 DIAGNOSIS — E034 Atrophy of thyroid (acquired): Secondary | ICD-10-CM

## 2023-07-14 DIAGNOSIS — R300 Dysuria: Secondary | ICD-10-CM | POA: Diagnosis not present

## 2023-07-14 LAB — MICROSCOPIC EXAMINATION
Epithelial Cells (non renal): NONE SEEN /HPF (ref 0–10)
RBC, Urine: NONE SEEN /HPF (ref 0–2)
Renal Epithel, UA: NONE SEEN /HPF
Yeast, UA: NONE SEEN

## 2023-07-14 LAB — URINALYSIS, ROUTINE W REFLEX MICROSCOPIC
Bilirubin, UA: NEGATIVE
Glucose, UA: NEGATIVE
Ketones, UA: NEGATIVE
Nitrite, UA: NEGATIVE
Protein,UA: NEGATIVE
RBC, UA: NEGATIVE
Specific Gravity, UA: 1.02 (ref 1.005–1.030)
Urobilinogen, Ur: 0.2 mg/dL (ref 0.2–1.0)
pH, UA: 7 (ref 5.0–7.5)

## 2023-07-14 LAB — BAYER DCA HB A1C WAIVED: HB A1C (BAYER DCA - WAIVED): 5.2 % (ref 4.8–5.6)

## 2023-07-14 LAB — LIPID PANEL

## 2023-07-14 MED ORDER — CEPHALEXIN 500 MG PO CAPS: 500.0000 mg | ORAL_CAPSULE | Freq: Two times a day (BID) | ORAL | 0 refills | Status: AC

## 2023-07-14 MED ORDER — BUDESONIDE-FORMOTEROL FUMARATE 80-4.5 MCG/ACT IN AERO: 2.0000 | INHALATION_SPRAY | Freq: Two times a day (BID) | RESPIRATORY_TRACT | 12 refills | Status: AC

## 2023-07-14 MED ORDER — OXYBUTYNIN CHLORIDE ER 10 MG PO TB24: 10.0000 mg | ORAL_TABLET | Freq: Every day | ORAL | 3 refills | Status: AC

## 2023-07-14 MED ORDER — TRIAMTERENE-HCTZ 37.5-25 MG PO TABS
0.5000 | ORAL_TABLET | Freq: Every day | ORAL | 3 refills | Status: AC
Start: 2023-07-14 — End: ?

## 2023-07-14 MED ORDER — METOPROLOL TARTRATE 50 MG PO TABS
25.0000 mg | ORAL_TABLET | Freq: Every day | ORAL | 3 refills | Status: AC
Start: 2023-07-14 — End: ?

## 2023-07-14 MED ORDER — HYDROCODONE-ACETAMINOPHEN 5-325 MG PO TABS: 1.0000 | ORAL_TABLET | Freq: Every day | ORAL | 0 refills | Status: DC | PRN

## 2023-07-14 NOTE — Progress Notes (Addendum)
 Selena Hunt is a 73 y.o. female presents to office today for annual physical exam examination.    Concerns today include: 1.  Myalgia, arthralgias Patient reports that she has been having increasing myalgia and arthralgia.  She points to the right MCP as an area of discomfort.  She has contractures of the left hand.  She reports bilateral shoulder pain, particularly in the morning.  She finds it hard to lift her arm sometimes.  She notes that she had a good to every other day dosing of the Zetia for cholesterol because daily caused her increased arthralgias.  She would like to have an arthritis workup if possible.  Had to use her Norco a few days ago because the pain had gotten too bad  Occupation: retired, Substance use: none Health Maintenance Due  Topic Date Due   Colonoscopy  02/04/2022   Medicare Annual Wellness (AWV)  08/05/2023   Refills needed today: all  Immunization History  Administered Date(s) Administered   Moderna Sars-Covid-2 Vaccination 07/09/2019, 08/10/2019, 04/21/2020   Pneumococcal Conjugate-13 01/14/2014   Pneumococcal Polysaccharide-23 03/06/2015   Td 04/08/2001, 01/23/2022   Td (Adult),5 Lf Tetanus Toxid, Preservative Free 04/08/2001   Tdap 01/07/2011   Past Medical History:  Diagnosis Date   Allergy    SEASONAL   Benign neoplasm of colon    Blood transfusion without reported diagnosis    x 34 to date   Cancer (HCC)    MELONAMA left foreham   Chronic kidney disease    Colon polyps    Diverticulitis of colon (without mention of hemorrhage)(562.11)    Diverticulosis of colon (without mention of hemorrhage)    Elevated ferritin 05/03/2019   Essential hypertension, benign    Female stress incontinence    GERD (gastroesophageal reflux disease)    Hematuria    Hernia of unspecified site of abdominal cavity without mention of obstruction or gangrene    History of closed head injury 04/21/2018   Kidney stones    has had one    Lumbosacral spondylosis  without myelopathy    Lymphedema    Meniere disease    Drs are uncertain of this DX    Migraine headache    Neuromuscular disorder (HCC)    Obesity    Other and unspecified hyperlipidemia    Oxygen deficiency    Pneumonia    Pulmonary embolism (HCC)    Seizures (HCC)    HX of --- from medications,2003   Stricture and stenosis of esophagus    Thyroid disease    Ventral hernia    Vertigo 12/12/2018   Social History   Socioeconomic History   Marital status: Widowed    Spouse name: Dahlila Pfahler   Number of children: 2   Years of education: Not on file   Highest education level: 12th grade  Occupational History   Occupation: Retired    Associate Professor: Nurse, adult [employer]    Comment: 2005    Occupation: retired  Tobacco Use   Smoking status: Never   Smokeless tobacco: Never  Vaping Use   Vaping status: Never Used  Substance and Sexual Activity   Alcohol use: Not Currently   Drug use: No   Sexual activity: Never    Birth control/protection: Post-menopausal  Other Topics Concern   Not on file  Social History Narrative   Lives alone.    Caffeine 2 cups daily    Right handed   Social Drivers of Health   Financial Resource Strain: Low Risk  (  07/14/2023)   Overall Financial Resource Strain (CARDIA)    Difficulty of Paying Living Expenses: Not hard at all  Food Insecurity: No Food Insecurity (07/14/2023)   Hunger Vital Sign    Worried About Running Out of Food in the Last Year: Never true    Ran Out of Food in the Last Year: Never true  Transportation Needs: No Transportation Needs (07/14/2023)   PRAPARE - Administrator, Civil Service (Medical): No    Lack of Transportation (Non-Medical): No  Physical Activity: Insufficiently Active (07/14/2023)   Exercise Vital Sign    Days of Exercise per Week: 4 days    Minutes of Exercise per Session: 30 min  Stress: No Stress Concern Present (07/14/2023)   [employer] of Occupational Health - Occupational Stress  Questionnaire    Feeling of Stress : Not at all  Social Connections: Moderately Isolated (07/14/2023)   Social Connection and Isolation Panel [NHANES]    Frequency of Communication with Friends and Family: More than three times a week    Frequency of Social Gatherings with Friends and Family: More than three times a week    Attends Religious Services: More than 4 times per year    Active Member of [employer] or Organizations: No    Attends Banker Meetings: Never    Marital Status: Widowed  Intimate Partner Violence: Not At Risk (07/14/2023)   Humiliation, Afraid, Rape, and Kick questionnaire    Fear of Current or Ex-Partner: No    Emotionally Abused: No    Physically Abused: No    Sexually Abused: No   Past Surgical History:  Procedure Laterality Date   ABDOMINAL HYSTERECTOMY  1990   APPENDECTOMY     cochlar     COLON SURGERY  1997   Removed   FEMORAL VARUS OSTEOTOMY W/ ADDUCTOR RELEASE AND ILIAC CREST BONE GRAFT, PELVIC OSTEOTOMY     FEMUR FRACTURE SURGERY Left 2003   HERNIA REPAIR     with mesh, abdominal   HIP FRACTURE SURGERY Left    HUMERUS FRACTURE SURGERY Left    LUMBAR FUSION  1995   Fusion L4 - L5   PARTIAL COLECTOMY     Secondary to diverticulitis   TENDON TRANSFER     TUBAL LIGATION  1972   ULNAR NERVE REPAIR Left    Family History  Problem Relation Age of Onset   Alzheimer's disease Mother    Hypertension Mother    Stroke Mother    Alzheimer's disease Father    Hypertension Father    Gout Brother    Fibromyalgia Daughter    Hypertension Son    Depression Son    Spondylitis Son        spondylosis   GER disease Son    Heart disease Maternal Grandmother    Heart disease Maternal Grandfather    Congestive Heart Failure Maternal Grandfather    Stroke Paternal Grandmother    Hypertension Paternal Grandmother    Depression Paternal Grandfather    Suicidality Paternal Grandfather    Colon cancer Neg Hx    Esophageal cancer Neg Hx    Pancreatic  cancer Neg Hx    Stomach cancer Neg Hx    Liver disease Neg Hx     Current Outpatient Medications:    albuterol (PROVENTIL) (2.5 MG/3ML) 0.083% nebulizer solution, take THREE mls by NEBULIZER EVERY 6 HOURS AS NEEDED FOR wheezing OR SHORTNESS OF BREATH, Disp: 150 mL, Rfl: 1   albuterol (VENTOLIN HFA) 108 (  90 Base) MCG/ACT inhaler, INHALE TWO PUFFS BY MOUTH EVERY 6 HOURS AS NEEDED FOR WHEEZING OR SHORTNESS OF BREATH, Disp: 8.5 g, Rfl: 4   calcium-vitamin D (OSCAL WITH D) 500-200 MG-UNIT tablet, Take 1 tablet by mouth daily with breakfast., Disp: 90 tablet, Rfl: 1   cycloSPORINE (RESTASIS) 0.05 % ophthalmic emulsion, , Disp: , Rfl:    cycloSPORINE 0.1 % EMUL, , Disp: , Rfl:    esomeprazole (NEXIUM) 40 MG capsule, Take 1 capsule (40 mg total) by mouth 2 (two) times daily before a meal., Disp: 60 capsule, Rfl: 3   ezetimibe (ZETIA) 10 MG tablet, Take 1 tablet (10 mg total) by mouth daily., Disp: 90 tablet, Rfl: 3   fluticasone (FLONASE) 50 MCG/ACT nasal spray, Place 2 sprays into both nostrils daily., Disp: 16 g, Rfl: 6   levothyroxine (SYNTHROID) 100 MCG tablet, TAKE 1 TABLET BY MOUTH ON MONDAYS AND FRIDAYS, AND TAKE 1/2 TABLET ALL OTHER DAYS, Disp: 90 tablet, Rfl: 4   meclizine (ANTIVERT) 25 MG tablet, Take 1 tablet (25 mg total) by mouth every 8 (eight) hours as needed for dizziness., Disp: 30 tablet, Rfl: 2   Multiple Vitamins-Minerals (WOMENS 50+ ADVANCED PO), Take by mouth., Disp: , Rfl:    nystatin (MYCOSTATIN) 100000 UNIT/ML suspension, Take 5 mLs (500,000 Units total) by mouth 4 (four) times daily as needed (thrush flare up from inhaler)., Disp: 473 mL, Rfl: 0   pravastatin (PRAVACHOL) 20 MG tablet, Take 1 tablet (20 mg total) by mouth daily. Could not tolerate 40mg , Disp: 90 tablet, Rfl: 3   Vitamin D, Ergocalciferol, (DRISDOL) 1.25 MG (50000 UNIT) CAPS capsule, TAKE ONE CAPSULE BY MOUTH EVERY SEVEN DAYS, Disp: 12 capsule, Rfl: 3   budesonide-formoterol (SYMBICORT) 80-4.5 MCG/ACT inhaler,  Inhale 2 puffs into the lungs in the morning and at bedtime., Disp: 10.2 g, Rfl: 12   HYDROcodone-acetaminophen (NORCO/VICODIN) 5-325 MG tablet, Take 1 tablet by mouth daily as needed for moderate pain (pain score 4-6) or severe pain (pain score 7-10)., Disp: 30 tablet, Rfl: 0   metoprolol tartrate (LOPRESSOR) 50 MG tablet, Take 0.5 tablets (25 mg total) by mouth daily., Disp: 45 tablet, Rfl: 3   oxybutynin (DITROPAN-XL) 10 MG 24 hr tablet, Take 1 tablet (10 mg total) by mouth at bedtime., Disp: 90 tablet, Rfl: 3   triamterene-hydrochlorothiazide (MAXZIDE-25) 37.5-25 MG tablet, Take 0.5 tablets by mouth daily., Disp: 45 tablet, Rfl: 3  Allergies  Allergen Reactions   Ambien [Zolpidem Tartrate] Anxiety   Diovan [Valsartan] Other (See Comments)    Seizures.   Wellbutrin [Bupropion] Other (See Comments)    Seizures.   Codeine     Feel crazy and dizzy    Doxycycline Other (See Comments)    conjunctivitis   Feldene [Piroxicam]     Swelling    Guaifenesin Er     Raise Bp / HR    Latex     Swelling    Mobic [Meloxicam] Other (See Comments)    Stomach cramps   Neurontin [Gabapentin]     "loopy"   Crestor [Rosuvastatin Calcium] Other (See Comments)    Myalgias    Lipitor [Atorvastatin Calcium] Other (See Comments)    myalgias   Livalo [Pitavastatin] Swelling and Other (See Comments)    stomach cramping    Simcor [Niacin-Simvastatin Er] Other (See Comments)    Stomach cramps   Zocor [Simvastatin] Other (See Comments)    Stomach cramps     ROS: Review of Systems A comprehensive review of systems was  negative except for: Eyes: positive for contacts/glasses Respiratory: positive for dyspnea on exertion Genitourinary: positive for urinary incontinence Hematologic/lymphatic: positive for lymphedema of b/l legs. 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 Septum midline. Mucosa normal. No drainage or sinus tenderness. Throat: lips, mucosa, and tongue normal; teeth and gums normal Neck: no adenopathy, supple, symmetrical, trachea midline, and thyroid not enlarged, symmetric, no tenderness/mass/nodules Back:  Mild increase kyphosis of thoracic spine Lungs: clear to auscultation bilaterally Heart: regular rate and rhythm, S1, S2 normal, no murmur, click, rub or gallop Abdomen: soft, non-tender; bowel sounds normal; no masses,  no organomegaly Extremities:  Lymphedema present bilaterally .  She has contracture of the left hand.  Right hand with no gross soft tissue swelling including joint swelling, erythema or warmth.  She is ambulating independently. Pulses: 2+ and symmetric Skin:  Senile purpura on the left forearm present Lymph nodes: Cervical, supraclavicular, and axillary nodes normal. Neurologic: Grossly normal      07/14/2023    8:08 AM 08/05/2022    3:02 PM 04/26/2022   12:59 PM  Depression screen PHQ 2/9  Decreased Interest 0 0 0  Down, Depressed, Hopeless 0 0 0  PHQ - 2 Score 0 0 0  Altered sleeping 0  1  Tired, decreased energy 0  1  Change in appetite 0  0  Feeling bad or failure about yourself  0  0  Trouble concentrating 0  0  Moving slowly or fidgety/restless 0  0  Suicidal thoughts 0  0  PHQ-9 Score 0  2  Difficult doing work/chores   Not difficult at all      07/14/2023    8:08 AM 04/26/2022   12:59 PM 01/23/2022    8:08 AM 07/02/2021    8:31 AM  GAD  7 : Generalized Anxiety Score  Nervous, Anxious, on Edge 0 0 0 0  Control/stop worrying 0 0 0 0  Worry too much - different things 0 0 0 0  Trouble relaxing 0 0 0 0  Restless 0 0 0 0  Easily annoyed or irritable 0 0 0 0  Afraid - awful might happen 0 0 0 0  Total GAD 7 Score 0 0 0 0  Anxiety Difficulty  Not difficult at all Not difficult at all Not difficult at all     Assessment/ Plan: Darcella Gasman here for annual physical exam.   Annual physical exam  Acute cystitis without hematuria - Plan: Urine Culture, Urinalysis, Routine w reflex microscopic, cephALEXin (KEFLEX) 500 MG capsule  Hypothyroidism due to acquired atrophy of thyroid - Plan: TSH + free T4  Hypertension - Plan: CMP14+EGFR, metoprolol tartrate (LOPRESSOR) 50 MG tablet  Pure hypercholesterolemia - Plan: CMP14+EGFR, Lipid Panel  Osteopenia of right forearm - Plan: CMP14+EGFR, CBC  Vitamin D deficiency - Plan: VITAMIN D 25 Hydroxy (Vit-D Deficiency, Fractures)  BMI 33.0-33.9,adult - Plan:  CMP14+EGFR, CBC, Bayer DCA Hb A1c Waived  Polyarthralgia - Plan: Rheumatoid factor, C-reactive protein, Sedimentation Rate, ANA w/Reflex if Positive, CK, ToxASSURE Select 13 (MW), Urine  Polymyalgia (HCC) - Plan: Rheumatoid factor, C-reactive protein, Sedimentation Rate, ANA w/Reflex if Positive, CK, ToxASSURE Select 13 (MW), Urine  Lumbosacral spondylosis without myelopathy - Plan: ToxASSURE Select 13 (MW), Urine, HYDROcodone-acetaminophen (NORCO/VICODIN) 5-325 MG tablet  Shortness of breath - Plan: budesonide-formoterol (SYMBICORT) 80-4.5 MCG/ACT inhaler  Lymphedema - Plan: triamterene-hydrochlorothiazide (MAXZIDE-25) 37.5-25 MG tablet  Keflex sent for presumed UTI given moderate bacteria and 6-10 white blood cells.  Negative nitrites.  Culture sent.  Check fasting labs.  Clinically euthyroid.  I have renewed medications that required refills prior to her next visit.  She will continue all medicines as prescribed  For her  polyarthralgia and polymyalgia, we will look for autoimmune etiology.  I have renewed her Norco for as needed use.  UDS and CSA were updated as per office policy and the national narcotic database was reviewed with no red flags  Discussed the importance of as needed use of albuterol and utilization of Symbicort as her primary modality of treatment during the allergy months  I renewed the Maxide but encouraged daily use of her lymphatic pumps as that will be her mainstay of treatment for lymphedema  Counseled on healthy lifestyle choices, including diet (rich in fruits, vegetables and lean meats and low in salt and simple carbohydrates) and exercise (at least 30 minutes of moderate physical activity daily).  Patient to follow up 73m  Hampton Cost M. Nadine Counts, DO

## 2023-07-14 NOTE — Addendum Note (Signed)
 Addended by: Raliegh Ip on: 07/14/2023 12:40 PM   Modules accepted: Orders

## 2023-07-14 NOTE — Patient Instructions (Signed)
 Set up colonoscopy  Health Maintenance, Female Adopting a healthy lifestyle and getting preventive care are important in promoting health and wellness. Ask your health care provider about: The right schedule for you to have regular tests and exams. Things you can do on your own to prevent diseases and keep yourself healthy. What should I know about diet, weight, and exercise? Eat a healthy diet  Eat a diet that includes plenty of vegetables, fruits, low-fat dairy products, and lean protein. Do not eat a lot of foods that are high in solid fats, added sugars, or sodium. Maintain a healthy weight Body mass index (BMI) is used to identify weight problems. It estimates body fat based on height and weight. Your health care provider can help determine your BMI and help you achieve or maintain a healthy weight. Get regular exercise Get regular exercise. This is one of the most important things you can do for your health. Most adults should: Exercise for at least 150 minutes each week. The exercise should increase your heart rate and make you sweat (moderate-intensity exercise). Do strengthening exercises at least twice a week. This is in addition to the moderate-intensity exercise. Spend less time sitting. Even light physical activity can be beneficial. Watch cholesterol and blood lipids Have your blood tested for lipids and cholesterol at 75 years of age, then have this test every 5 years. Have your cholesterol levels checked more often if: Your lipid or cholesterol levels are high. You are older than 75 years of age. You are at high risk for heart disease. What should I know about cancer screening? Depending on your health history and family history, you may need to have cancer screening at various ages. This may include screening for: Breast cancer. Cervical cancer. Colorectal cancer. Skin cancer. Lung cancer. What should I know about heart disease, diabetes, and high blood  pressure? Blood pressure and heart disease High blood pressure causes heart disease and increases the risk of stroke. This is more likely to develop in people who have high blood pressure readings or are overweight. Have your blood pressure checked: Every 3-5 years if you are 55-74 years of age. Every year if you are 66 years old or older. Diabetes Have regular diabetes screenings. This checks your fasting blood sugar level. Have the screening done: Once every three years after age 34 if you are at a normal weight and have a low risk for diabetes. More often and at a younger age if you are overweight or have a high risk for diabetes. What should I know about preventing infection? Hepatitis B If you have a higher risk for hepatitis B, you should be screened for this virus. Talk with your health care provider to find out if you are at risk for hepatitis B infection. Hepatitis C Testing is recommended for: Everyone born from [birth year] through 1967. Anyone with known risk factors for hepatitis C. Sexually transmitted infections (STIs) Get screened for STIs, including gonorrhea and chlamydia, if: You are sexually active and are younger than 75 years of age. You are older than 75 years of age and your health care provider tells you that you are at risk for this type of infection. Your sexual activity has changed since you were last screened, and you are at increased risk for chlamydia or gonorrhea. Ask your health care provider if you are at risk. Ask your health care provider about whether you are at high risk for HIV. Your health care provider may recommend a prescription medicine  to help prevent HIV infection. If you choose to take medicine to prevent HIV, you should first get tested for HIV. You should then be tested every 3 months for as long as you are taking the medicine. Pregnancy If you are about to stop having your period (premenopausal) and you may become pregnant, seek counseling before you  get pregnant. Take 400 to 800 micrograms (mcg) of folic acid every day if you become pregnant. Ask for birth control (contraception) if you want to prevent pregnancy. Osteoporosis and menopause Osteoporosis is a disease in which the bones lose minerals and strength with aging. This can result in bone fractures. If you are 75 years old or older, or if you are at risk for osteoporosis and fractures, ask your health care provider if you should: Be screened for bone loss. Take a calcium or vitamin D supplement to lower your risk of fractures. Be given hormone replacement therapy (HRT) to treat symptoms of menopause. Follow these instructions at home: Alcohol use Do not drink alcohol if: Your health care provider tells you not to drink. You are pregnant, may be pregnant, or are planning to become pregnant. If you drink alcohol: Limit how much you have to: 0-1 drink a day. Know how much alcohol is in your drink. In the [place] one drink equals one 12 oz bottle of beer (355 mL), one 5 oz glass of wine (148 mL), or one 1 oz glass of hard liquor (44 mL). Lifestyle Do not use any products that contain nicotine or tobacco. These products include cigarettes, chewing tobacco, and vaping devices, such as e-cigarettes. If you need help quitting, ask your health care provider. Do not use street drugs. Do not share needles. Ask your health care provider for help if you need support or information about quitting drugs. General instructions Schedule regular health, dental, and eye exams. Stay current with your vaccines. Tell your health care provider if: You often feel depressed. You have ever been abused or do not feel safe at home. Summary Adopting a healthy lifestyle and getting preventive care are important in promoting health and wellness. Follow your health care provider's instructions about healthy diet, exercising, and getting tested or screened for diseases. Follow your health care provider's  instructions on monitoring your cholesterol and blood pressure. This information is not intended to replace advice given to you by your health care provider. Make sure you discuss any questions you have with your health care provider. Document Revised: 08/14/2020 Document Reviewed: 08/14/2020 Elsevier Patient Education  2024 [employer].

## 2023-07-15 LAB — CBC
Hematocrit: 42.8 % (ref 34.0–46.6)
Hemoglobin: 13.9 g/dL (ref 11.1–15.9)
MCH: 27.3 pg (ref 26.6–33.0)
MCHC: 32.5 g/dL (ref 31.5–35.7)
MCV: 84 fL (ref 79–97)
Platelets: 210 10*3/uL (ref 150–450)
RBC: 5.09 x10E6/uL (ref 3.77–5.28)
RDW: 13.4 % (ref 11.7–15.4)
WBC: 4.5 10*3/uL (ref 3.4–10.8)

## 2023-07-15 LAB — CMP14+EGFR
ALT: 19 IU/L (ref 0–32)
AST: 22 IU/L (ref 0–40)
Albumin: 4.3 g/dL (ref 3.8–4.8)
Alkaline Phosphatase: 103 IU/L (ref 44–121)
BUN/Creatinine Ratio: 20 (ref 12–28)
BUN: 19 mg/dL (ref 8–27)
Bilirubin Total: 0.4 mg/dL (ref 0.0–1.2)
CO2: 24 mmol/L (ref 20–29)
Calcium: 9.6 mg/dL (ref 8.7–10.3)
Chloride: 106 mmol/L (ref 96–106)
Creatinine, Ser: 0.94 mg/dL (ref 0.57–1.00)
Globulin, Total: 1.9 g/dL (ref 1.5–4.5)
Glucose: 87 mg/dL (ref 70–99)
Potassium: 4.1 mmol/L (ref 3.5–5.2)
Sodium: 145 mmol/L — ABNORMAL HIGH (ref 134–144)
Total Protein: 6.2 g/dL (ref 6.0–8.5)
eGFR: 64 mL/min/{1.73_m2} (ref >59)

## 2023-07-15 LAB — LIPID PANEL
Cholesterol, Total: 179 mg/dL (ref 100–199)
HDL: 57 mg/dL (ref >39)
LDL CALC COMMENT:: 3.1 ratio (ref 0.0–4.4)
LDL Chol Calc (NIH): 106 mg/dL — ABNORMAL HIGH (ref 0–99)
Triglycerides: 88 mg/dL (ref 0–149)
VLDL Cholesterol Cal: 16 mg/dL (ref 5–40)

## 2023-07-15 LAB — VITAMIN D 25 HYDROXY (VIT D DEFICIENCY, FRACTURES): Vit D, 25-Hydroxy: 67.1 ng/mL (ref 30.0–100.0)

## 2023-07-15 LAB — TSH+FREE T4
Free T4: 1.33 ng/dL (ref 0.82–1.77)
TSH: 1.72 u[IU]/mL (ref 0.450–4.500)

## 2023-07-15 LAB — SEDIMENTATION RATE: Sed Rate: 3 mm/h (ref 0–40)

## 2023-07-15 LAB — RHEUMATOID FACTOR

## 2023-07-15 LAB — C-REACTIVE PROTEIN: CRP: 5 mg/L (ref 0–10)

## 2023-07-15 LAB — ANA W/REFLEX IF POSITIVE

## 2023-07-15 LAB — CK: Total CK: 98 U/L (ref 32–182)

## 2023-07-16 LAB — TOXASSURE SELECT 13 (MW), URINE

## 2023-07-17 LAB — URINE CULTURE

## 2023-08-05 ENCOUNTER — Other Ambulatory Visit: Payer: Self-pay | Admitting: Family Medicine

## 2023-08-05 DIAGNOSIS — R0602 Shortness of breath: Secondary | ICD-10-CM

## 2023-08-11 ENCOUNTER — Ambulatory Visit (INDEPENDENT_AMBULATORY_CARE_PROVIDER_SITE_OTHER): Payer: Medicare Other

## 2023-08-11 VITALS — Ht 64.0 in | Wt 198.0 lb

## 2023-08-11 DIAGNOSIS — Z0000 Encounter for general adult medical examination without abnormal findings: Secondary | ICD-10-CM | POA: Diagnosis not present

## 2023-08-11 NOTE — Patient Instructions (Signed)
 Selena Hunt , Thank you for taking time to come for your Medicare Wellness Visit. I appreciate your ongoing commitment to your health goals. Please review the following plan we discussed and let me know if I can assist you in the future.   Referrals/Orders/Follow-Ups/Clinician Recommendations: Aim for 30 minutes of exercise or brisk walking, 6-8 glasses of water, and 5 servings of fruits and vegetables each day.  This is a list of the screening recommended for you and due dates:  Health Maintenance  Topic Date Due   Colon Cancer Screening  02/04/2022   COVID-19 Vaccine (4 - 2024-25 season) 12/08/2022   Zoster (Shingles) Vaccine (1 of 2) 10/13/2023*   Flu Shot  11/07/2023   Mammogram  11/12/2023   Medicare Annual Wellness Visit  08/10/2024   DEXA scan (bone density measurement)  01/29/2025   DTaP/Tdap/Td vaccine (4 - Td or Tdap) 01/24/2032   Pneumonia Vaccine  Completed   Hepatitis C Screening  Completed   HPV Vaccine  Aged Out   Meningitis B Vaccine  Aged Out  *Topic was postponed. The date shown is not the original due date.    Advanced directives: (ACP Link)Information on Advanced Care Planning can be found at [place]  Secretary of [facility] Advance Health Care Directives Advance Health Care Directives. [email or web address]   Next Medicare Annual Wellness Visit scheduled for next year: Yes  Have you seen your provider in the last 6 months (3 months if uncontrolled diabetes)? Yes

## 2023-08-11 NOTE — Progress Notes (Signed)
 Subjective:   Selena Hunt is a 73 y.o. who presents for a Medicare Wellness preventive visit.  Visit Complete: Virtual I connected with  Selena Hunt on 08/11/23 by a audio enabled telemedicine application and verified that I am speaking with the correct person using two identifiers.  Patient Location: Home  Provider Location: Home Office  I discussed the limitations of evaluation and management by telemedicine. The patient expressed understanding and agreed to proceed.  Vital Signs: Because this visit was a virtual/telehealth visit, some criteria may be missing or patient reported. Any vitals not documented were not able to be obtained and vitals that have been documented are patient reported.  VideoDeclined- This patient declined Librarian, academic. Therefore the visit was completed with audio only.  Persons Participating in Visit: Patient.  AWV Questionnaire: No: Patient Medicare AWV questionnaire was not completed prior to this visit.  Cardiac Risk Factors include: advanced age (>80men, >88 women);dyslipidemia;hypertension     Objective:    Today's Vitals   08/11/23 0811  Weight: 198 lb (89.8 kg)  Height: 5\' 4"  (1.626 m)   Body mass index is 33.99 kg/m.     08/11/2023    8:41 AM 08/05/2022    3:03 PM 07/30/2021    3:02 PM 07/28/2020    2:35 PM 10/05/2019    2:43 PM 05/31/2019    8:48 AM 03/31/2019    6:04 PM  Advanced Directives  Does Patient Have a Medical Advance Directive? No Yes Yes Yes No Yes Yes  Type of Special educational needs teacher of [place];Living will Healthcare Power of [place];Living will Healthcare Power of [place];Living will  Healthcare Power of [place];Living will;Out of facility DNR (pink MOST or yellow form) Living will  Does patient want to make changes to medical advance directive?       No - Patient declined  Copy of Healthcare Power of Attorney in Chart?  No - copy requested No - copy requested No - copy  requested     Would patient like information on creating a medical advance directive? Yes (MAU/Ambulatory/Procedural Areas - Information given)    No - Patient declined      Current Medications (verified) Outpatient Encounter Medications as of 08/11/2023  Medication Sig   albuterol  (PROVENTIL ) (2.5 MG/3ML) 0.083% nebulizer solution take THREE mls by NEBULIZER EVERY 6 HOURS AS NEEDED FOR wheezing OR SHORTNESS OF BREATH   albuterol  (VENTOLIN  HFA) 108 (90 Base) MCG/ACT inhaler INHALE TWO PUFFS BY MOUTH EVERY 6 HOURS AS NEEDED FOR WHEEZING OR SHORTNESS OF BREATH   budesonide -formoterol  (SYMBICORT ) 80-4.5 MCG/ACT inhaler Inhale 2 puffs into the lungs in the morning and at bedtime.   calcium -vitamin D  (OSCAL WITH D) 500-200 MG-UNIT tablet Take 1 tablet by mouth daily with breakfast.   cycloSPORINE (RESTASIS) 0.05 % ophthalmic emulsion    cycloSPORINE 0.1 % EMUL    esomeprazole  (NEXIUM ) 40 MG capsule Take 1 capsule (40 mg total) by mouth 2 (two) times daily before a meal.   ezetimibe  (ZETIA ) 10 MG tablet Take 1 tablet (10 mg total) by mouth daily.   fluticasone  (FLONASE ) 50 MCG/ACT nasal spray Place 2 sprays into both nostrils daily.   HYDROcodone -acetaminophen  (NORCO/VICODIN) 5-325 MG tablet Take 1 tablet by mouth daily as needed for moderate pain (pain score 4-6) or severe pain (pain score 7-10).   levothyroxine  (SYNTHROID ) 100 MCG tablet TAKE 1 TABLET BY MOUTH ON MONDAYS AND FRIDAYS, AND TAKE 1/2 TABLET ALL OTHER DAYS   meclizine  (ANTIVERT ) 25  MG tablet Take 1 tablet (25 mg total) by mouth every 8 (eight) hours as needed for dizziness.   metoprolol  tartrate (LOPRESSOR ) 50 MG tablet Take 0.5 tablets (25 mg total) by mouth daily.   Multiple Vitamins-Minerals (WOMENS 50+ ADVANCED PO) Take by mouth.   nystatin  (MYCOSTATIN ) 100000 UNIT/ML suspension Take 5 mLs (500,000 Units total) by mouth 4 (four) times daily as needed (thrush flare up from inhaler).   oxybutynin  (DITROPAN -XL) 10 MG 24 hr tablet Take 1  tablet (10 mg total) by mouth at bedtime.   pravastatin  (PRAVACHOL ) 20 MG tablet Take 1 tablet (20 mg total) by mouth daily. Could not tolerate 40mg    triamterene -hydrochlorothiazide  (MAXZIDE-25) 37.5-25 MG tablet Take 0.5 tablets by mouth daily.   Vitamin D , Ergocalciferol , (DRISDOL ) 1.25 MG (50000 UNIT) CAPS capsule TAKE ONE CAPSULE BY MOUTH EVERY SEVEN DAYS   No facility-administered encounter medications on file as of 08/11/2023.    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[place]   Chronic kidney disease    Colon polyps    Diverticulitis of colon (without mention of hemorrhage)(562.11)    Diverticulosis of colon (without mention of hemorrhage)    Elevated ferritin 05/03/2019   Essential hypertension, benign    Female stress incontinence    GERD (gastroesophageal reflux disease)    Hematuria    Hernia of unspecified site of abdominal cavity without mention of obstruction or gangrene    History of closed head injury 04/21/2018   Kidney stones    has had one    Lumbosacral spondylosis without myelopathy    Lymphedema    Meniere disease    Drs are uncertain of this DX    Migraine headache    Neuromuscular disorder (HCC)    Obesity    Other and unspecified hyperlipidemia    Oxygen  deficiency    Pneumonia    Pulmonary embolism (HCC)    Seizures (HCC)    HX of --- from medications,2003   Stricture and stenosis of esophagus    Thyroid  disease    Ventral hernia    Vertigo 12/12/2018    Past Surgical History:  Procedure Laterality Date   ABDOMINAL HYSTERECTOMY  1990   APPENDECTOMY     cochlar     COLON SURGERY  1997   Removed   FEMORAL VARUS OSTEOTOMY W/ ADDUCTOR RELEASE AND ILIAC CREST BONE GRAFT, PELVIC OSTEOTOMY     FEMUR FRACTURE SURGERY Left 2003   HERNIA REPAIR     with mesh, abdominal   HIP FRACTURE SURGERY Left    HUMERUS FRACTURE SURGERY Left    LUMBAR FUSION  1995   Fusion L4 - L5   PARTIAL COLECTOMY     Secondary to diverticulitis   TENDON TRANSFER     TUBAL LIGATION  1972   ULNAR NERVE REPAIR Left    Family History  Problem Relation Age of Onset   Alzheimer's disease Mother    Hypertension Mother    Stroke Mother    Alzheimer's disease Father    Hypertension Father    Gout Brother  Fibromyalgia Daughter    Hypertension Son    Depression Son    Spondylitis Son        spondylosis   GER disease Son    Heart disease Maternal Grandmother    Heart disease Maternal Grandfather    Congestive Heart Failure Maternal Grandfather    Stroke Paternal Grandmother    Hypertension Paternal Grandmother    Depression Paternal Grandfather    Suicidality Paternal Grandfather    Colon cancer Neg Hx    Esophageal cancer Neg Hx    Pancreatic cancer Neg Hx    Stomach cancer Neg Hx    Liver disease Neg Hx    Social History   Socioeconomic History   Marital status: Widowed    Spouse name: Jocabed [place]   Number of children: 2   Years of education: Not on file   Highest education level: 12th grade  Occupational History   Occupation: Retired    Associate Professor: Nurse, adult [employer]    Comment: 2005    Occupation: retired  Tobacco Use   Smoking status: Never   Smokeless tobacco: Never  Vaping Use   Vaping status: Never Used  Substance and Sexual Activity   Alcohol use: Not Currently   Drug use: No   Sexual activity: Never    Birth control/protection: Post-menopausal  Other Topics Concern   Not on file  Social History Narrative   Lives alone.     Caffeine 2 cups daily    Right handed   Social Drivers of Health   Financial Resource Strain: Low Risk  (08/11/2023)   Overall Financial Resource Strain (CARDIA)    Difficulty of Paying Living Expenses: Not hard at all  Food Insecurity: No Food Insecurity (08/11/2023)   Hunger Vital Sign    Worried About Running Out of Food in the Last Year: Never true    Ran Out of Food in the Last Year: Never true  Transportation Needs: No Transportation Needs (08/11/2023)   PRAPARE - Administrator, Civil Service (Medical): No    Lack of Transportation (Non-Medical): No  Physical Activity: Sufficiently Active (08/11/2023)   Exercise Vital Sign    Days of Exercise per Week: 5 days    Minutes of Exercise per Session: 30 min  Recent Concern: Physical Activity - Insufficiently Active (07/14/2023)   Exercise Vital Sign    Days of Exercise per Week: 4 days    Minutes of Exercise per Session: 30 min  Stress: No Stress Concern Present (08/11/2023)   [employer] of Occupational Health - Occupational Stress Questionnaire    Feeling of Stress : Not at all  Social Connections: Moderately Isolated (08/11/2023)   Social Connection and Isolation Panel [NHANES]    Frequency of Communication with Friends and Family: More than three times a week    Frequency of Social Gatherings with Friends and Family: Three times a week    Attends Religious Services: More than 4 times per year    Active Member of Clubs or Organizations: No    Attends Banker Meetings: Never    Marital Status: Widowed    Tobacco Counseling Counseling given: Not Answered    Clinical Intake:  Pre-visit preparation completed: Yes  Pain : No/denies pain     Diabetes: No  Lab Results  Component Value Date   HGBA1C 5.2 07/14/2023   HGBA1C 5.0 07/02/2021   HGBA1C 5.3 09/13/2013     How often do you need to have someone help you when  you read instructions, pamphlets, or other written materials from your doctor or  pharmacy?: 1 - Never  Interpreter Needed?: No  Information entered by :: Seabron Cypress LPN   Activities of Daily Living     08/11/2023    8:40 AM  In your present state of health, do you have any difficulty performing the following activities:  Hearing? 0  Vision? 0  Difficulty concentrating or making decisions? 0  Walking or climbing stairs? 1  Comment at times  Dressing or bathing? 0  Doing errands, shopping? 0  Preparing Food and eating ? N  Using the Toilet? N  In the past six months, have you accidently leaked urine? N  Do you have problems with loss of bowel control? N  Managing your Medications? N  Managing your Finances? N  Housekeeping or managing your Housekeeping? N    Patient Care Team: [name], DO as PCP - General (Family Medicine) Amanda Jungling, Joyceann No, MD as PCP - Cardiology (Cardiology) Lucendia Rusk, DO (Optometry) Winston Hawking, MD as Consulting Physician (Orthopedic Surgery) Eilleen Grates, MD as Consulting Physician (Cardiology) Deena Farrier, MD as Consulting Physician (Otolaryngology) Gwynne Less Francena Infield, MD as Referring Physician Gerre Kraft, DMD (Dentistry) Ryland Cozier, MD as Consulting Physician (Otolaryngology) Asencion Blacksmith, MD (Inactive) as Consulting Physician (Gastroenterology) Devon Fogo, MD (Inactive) as Consulting Physician (Dermatology)  Indicate any recent Medical Services you may have received from other than [facility] providers in the past year (date may be approximate).     Assessment:   This is a routine wellness examination for [employer].  Hearing/Vision screen Hearing Screening - Comments:: Some hearing issues. Deaf R ear, had Cochlear implant surgery scheduled for 09/2021. Vision Screening - Comments:: Wears rx glasses - up to date with routine eye exams with [facility]. Selene Dais    Goals Addressed             This Visit's Progress    AWV   On track    05/31/2019 AWV Goal: Exercise for General  Health  Patient will verbalize understanding of the benefits of increased physical activity: Exercising regularly is important. It will improve your overall fitness, flexibility, and endurance. Regular exercise also will improve your overall health. It can help you control your weight, reduce stress, and improve your bone density. Over the next year, patient will increase physical activity as tolerated with a goal of at least 150 minutes of moderate physical activity per week.  You can tell that you are exercising at a moderate intensity if your heart starts beating faster and you start breathing faster but can still hold a conversation. Moderate-intensity exercise ideas include: Walking 1 mile (1.6 km) in about 15 minutes Biking Hiking Golfing Dancing Water aerobics Patient will verbalize understanding of everyday activities that increase physical activity by providing examples like the following: Yard work, such as: Insurance underwriter Gardening Washing windows or floors Patient will be able to explain general safety guidelines for exercising:  Before you start a new exercise program, talk with your health care provider. Do not exercise so much that you hurt yourself, feel dizzy, or get very short of breath. Wear comfortable clothes and wear shoes with good support. Drink plenty of water while you exercise to prevent dehydration or heat stroke. Work out until your breathing and your heartbeat get faster.        Depression Screen     08/11/2023  8:29 AM 07/14/2023    8:08 AM 08/05/2022    3:02 PM 04/26/2022   12:59 PM 01/23/2022    8:07 AM 07/30/2021    2:58 PM 07/02/2021    8:31 AM  PHQ 2/9 Scores  PHQ - 2 Score 0 0 0 0 0 0 0  PHQ- 9 Score 0 0  2       Fall Risk     08/11/2023    8:40 AM 07/14/2023    8:09 AM 01/13/2023    8:21 AM 08/05/2022    3:00 PM 08/01/2022   11:45 AM  Fall Risk   Falls in  the past year? 0 0 0 0 0  Number falls in past yr: 0 0 0 0   Injury with Fall? 0 0 0 0 0  Risk for fall due to : No Fall Risks No Fall Risks No Fall Risks No Fall Risks   Follow up Falls prevention discussed;Education provided;Falls evaluation completed Falls evaluation completed Education provided Falls prevention discussed     MEDICARE RISK AT HOME:  Medicare Risk at Home Any stairs in or around the home?: No If so, are there any without handrails?: No Home free of loose throw rugs in walkways, pet beds, electrical cords, etc?: Yes Adequate lighting in your home to reduce risk of falls?: Yes Life alert?: No Use of a cane, walker or w/c?: No Grab bars in the bathroom?: Yes Shower chair or bench in shower?: No Elevated toilet seat or a handicapped toilet?: Yes  TIMED UP AND GO:  Was the test performed?  No  Cognitive Function: 6CIT completed    08/04/2017    9:24 AM 07/23/2016    2:26 PM  MMSE - Mini Mental State Exam  Orientation to time 5 5  Orientation to Place 5 5  Registration 3 3  Attention/ Calculation 5 5  Recall 3 3  Language- name 2 objects 2 2  Language- repeat 1 1  Language- follow 3 step command 3 3  Language- read & follow direction 1 1  Write a sentence 1 1  Copy design 1 1  Total score 30 30        08/11/2023    8:41 AM 08/05/2022    3:03 PM 07/30/2021    3:10 PM 05/31/2019    8:48 AM  6CIT Screen  What Year? 0 points 0 points 0 points 0 points  What month? 0 points 0 points 0 points 0 points  What time? 0 points 0 points 0 points 0 points  Count back from 20 0 points 0 points 0 points 0 points  Months in reverse 0 points 0 points 0 points 2 points  Repeat phrase 0 points 0 points 0 points 0 points  Total Score 0 points 0 points 0 points 2 points    Immunizations Immunization History  Administered Date(s) Administered   Moderna Sars-Covid-2 Vaccination 07/09/2019, 08/10/2019, 04/21/2020   Pneumococcal Conjugate-13 01/14/2014   Pneumococcal  Polysaccharide-23 03/06/2015   Td 04/08/2001, 01/23/2022   Td (Adult),5 Lf Tetanus Toxid, Preservative Free 04/08/2001   Tdap 01/07/2011    Screening Tests Health Maintenance  Topic Date Due   Colonoscopy  02/04/2022   COVID-19 Vaccine (4 - 2024-25 season) 12/08/2022   Zoster Vaccines- Shingrix (1 of 2) 10/13/2023 (Originally 01/08/1970)   INFLUENZA VACCINE  11/07/2023   MAMMOGRAM  11/12/2023   Medicare Annual Wellness (AWV)  08/10/2024   DEXA SCAN  01/29/2025   DTaP/Tdap/Td (4 - Td or Tdap)  01/24/2032   Pneumonia Vaccine 19+ Years old  Completed   Hepatitis C Screening  Completed   HPV VACCINES  Aged Out   Meningococcal B Vaccine  Aged Out    Health Maintenance  Health Maintenance Due  Topic Date Due   Colonoscopy  02/04/2022   COVID-19 Vaccine (4 - 2024-25 season) 12/08/2022   Health Maintenance Items Addressed: Patient states that she is scheduled for colonoscopy in October 2025  Additional Screening:  Vision Screening: Recommended annual ophthalmology exams for early detection of glaucoma and other disorders of the eye.  Dental Screening: Recommended annual dental exams for proper oral hygiene  Community Resource Referral / Chronic Care Management: CRR required this visit?  No   CCM required this visit?  No     Plan:     I have personally reviewed and noted the following in the patient's chart:   Medical and social history Use of alcohol, tobacco or illicit drugs  Current medications and supplements including opioid prescriptions. Patient is not currently taking opioid prescriptions. Functional ability and status Nutritional status Physical activity Advanced directives List of other physicians Hospitalizations, surgeries, and ER visits in previous 12 months Vitals Screenings to include cognitive, depression, and falls Referrals and appointments  In addition, I have reviewed and discussed with patient certain preventive protocols, quality metrics, and  best practice recommendations. A written personalized care plan for preventive services as well as general preventive health recommendations were provided to patient.     Seabron Cypress [place], [place]   04/13/1094   After Visit Summary: (MyChart) Due to this being a telephonic visit, the after visit summary with patients personalized plan was offered to patient via MyChart   Notes: Nothing significant to report at this time.

## 2023-08-28 DIAGNOSIS — U099 Post covid-19 condition, unspecified: Secondary | ICD-10-CM | POA: Diagnosis not present

## 2023-08-28 DIAGNOSIS — Z885 Allergy status to narcotic agent status: Secondary | ICD-10-CM | POA: Diagnosis not present

## 2023-08-28 DIAGNOSIS — Z79899 Other long term (current) drug therapy: Secondary | ICD-10-CM | POA: Diagnosis not present

## 2023-08-28 DIAGNOSIS — H90A21 Sensorineural hearing loss, unilateral, right ear, with restricted hearing on the contralateral side: Secondary | ICD-10-CM | POA: Diagnosis not present

## 2023-08-28 DIAGNOSIS — Z881 Allergy status to other antibiotic agents status: Secondary | ICD-10-CM | POA: Diagnosis not present

## 2023-08-28 DIAGNOSIS — H8101 Meniere's disease, right ear: Secondary | ICD-10-CM | POA: Diagnosis not present

## 2023-08-28 DIAGNOSIS — Z9621 Cochlear implant status: Secondary | ICD-10-CM | POA: Diagnosis not present

## 2023-08-28 DIAGNOSIS — H903 Sensorineural hearing loss, bilateral: Secondary | ICD-10-CM | POA: Diagnosis not present

## 2023-08-28 DIAGNOSIS — Z4889 Encounter for other specified surgical aftercare: Secondary | ICD-10-CM | POA: Diagnosis not present

## 2023-09-22 ENCOUNTER — Ambulatory Visit: Payer: Self-pay | Admitting: Family Medicine

## 2023-09-22 ENCOUNTER — Encounter: Payer: Self-pay | Admitting: Family Medicine

## 2023-09-22 ENCOUNTER — Ambulatory Visit (INDEPENDENT_AMBULATORY_CARE_PROVIDER_SITE_OTHER)

## 2023-09-22 ENCOUNTER — Ambulatory Visit (INDEPENDENT_AMBULATORY_CARE_PROVIDER_SITE_OTHER): Admitting: Family Medicine

## 2023-09-22 VITALS — BP 141/79 | HR 64 | Temp 97.9°F | Ht 64.0 in | Wt 201.0 lb

## 2023-09-22 DIAGNOSIS — B9689 Other specified bacterial agents as the cause of diseases classified elsewhere: Secondary | ICD-10-CM

## 2023-09-22 DIAGNOSIS — R059 Cough, unspecified: Secondary | ICD-10-CM | POA: Diagnosis not present

## 2023-09-22 DIAGNOSIS — J208 Acute bronchitis due to other specified organisms: Secondary | ICD-10-CM | POA: Diagnosis not present

## 2023-09-22 DIAGNOSIS — R0989 Other specified symptoms and signs involving the circulatory and respiratory systems: Secondary | ICD-10-CM | POA: Diagnosis not present

## 2023-09-22 DIAGNOSIS — K449 Diaphragmatic hernia without obstruction or gangrene: Secondary | ICD-10-CM | POA: Diagnosis not present

## 2023-09-22 DIAGNOSIS — J984 Other disorders of lung: Secondary | ICD-10-CM | POA: Diagnosis not present

## 2023-09-22 DIAGNOSIS — R9389 Abnormal findings on diagnostic imaging of other specified body structures: Secondary | ICD-10-CM | POA: Diagnosis not present

## 2023-09-22 DIAGNOSIS — R918 Other nonspecific abnormal finding of lung field: Secondary | ICD-10-CM | POA: Diagnosis not present

## 2023-09-22 MED ORDER — FLUCONAZOLE 150 MG PO TABS: 150.0000 mg | ORAL_TABLET | Freq: Once | ORAL | 0 refills | Status: AC

## 2023-09-22 MED ORDER — BENZONATATE 100 MG PO CAPS: 100.0000 mg | ORAL_CAPSULE | Freq: Three times a day (TID) | ORAL | 0 refills | Status: DC | PRN

## 2023-09-22 MED ORDER — PREDNISONE 20 MG PO TABS: ORAL_TABLET | ORAL | 0 refills | Status: AC

## 2023-09-22 MED ORDER — IPRATROPIUM-ALBUTEROL 0.5-2.5 (3) MG/3ML IN SOLN
3.0000 mL | Freq: Once | RESPIRATORY_TRACT | Status: AC
  Administered 2023-09-22: 3 mL via RESPIRATORY_TRACT

## 2023-09-22 MED ORDER — AMOXICILLIN-POT CLAVULANATE 875-125 MG PO TABS: 1.0000 | ORAL_TABLET | Freq: Two times a day (BID) | ORAL | 0 refills | Status: DC

## 2023-09-22 NOTE — Patient Instructions (Signed)
 Acute Bronchitis, Adult  Acute bronchitis is when air tubes in the lungs (bronchi) suddenly get swollen. The condition can make it hard for you to breathe. In adults, acute bronchitis usually goes away within 2 weeks. A cough caused by bronchitis may last up to 3 weeks. Smoking, allergies, and asthma can make the condition worse. What are the causes? Germs that cause cold and flu (viruses). The most common cause of this condition is the virus that causes the common cold. Bacteria. Substances that bother (irritate) the lungs, including: Smoke from cigarettes and other types of tobacco. Dust and pollen. Fumes from chemicals, gases, or burned fuel. Indoor or outdoor air pollution. What increases the risk? A weak body's defense system. This is also called the immune system. Any condition that affects your lungs and breathing, such as asthma. What are the signs or symptoms? A cough. Coughing up clear, yellow, or green mucus. Making high-pitched whistling sounds when you breathe, most often when you breathe out (wheezing). Runny or stuffy nose. Having too much mucus in your lungs (chest congestion). Shortness of breath. Body aches. A sore throat. How is this treated? Acute bronchitis may go away over time without treatment. Your doctor may tell you to: Drink more fluids. This will help thin your mucus so it is easier to cough up. Use a device that gets medicine into your lungs (inhaler). Use a vaporizer or a humidifier. These are machines that add water to the air. This helps with coughing and poor breathing. Take a medicine that thins mucus and helps clear it from your lungs. Take a medicine that prevents or stops coughing. It is not common to take an antibiotic medicine for this condition. Follow these instructions at home:  Take over-the-counter and prescription medicines only as told by your doctor. Use an inhaler, vaporizer, or humidifier as told by your doctor. Take two teaspoons  (10 mL) of honey at bedtime. This helps lessen your coughing at night. Drink enough fluid to keep your pee (urine) pale yellow. Do not smoke or use any products that contain nicotine or tobacco. If you need help quitting, ask your doctor. Get a lot of rest. Return to your normal activities when your doctor says that it is safe. Keep all follow-up visits. How is this prevented?  Wash your hands often with soap and water for at least 20 seconds. If you cannot use soap and water, use hand sanitizer. Avoid contact with people who have cold symptoms. Try not to touch your mouth, nose, or eyes with your hands. Avoid breathing in smoke or chemical fumes. Make sure to get the flu shot every year. Contact a doctor if: Your symptoms do not get better in 2 weeks. You have trouble coughing up the mucus. Your cough keeps you awake at night. You have a fever. Get help right away if: You cough up blood. You have chest pain. You have very bad shortness of breath. You faint or keep feeling like you are going to faint. You have a very bad headache. Your fever or chills get worse. These symptoms may be an emergency. Get help right away. Call your local emergency services (911 in the [place] Do not wait to see if the symptoms will go away. Do not drive yourself to the hospital. Summary Acute bronchitis is when air tubes in the lungs (bronchi) suddenly get swollen. In adults, acute bronchitis usually goes away within 2 weeks. Drink more fluids. This will help thin your mucus so it is easier  to cough up. Take over-the-counter and prescription medicines only as told by your doctor. Contact a doctor if your symptoms do not improve after 2 weeks of treatment. This information is not intended to replace advice given to you by your health care provider. Make sure you discuss any questions you have with your health care provider. Document Revised: 07/26/2020 Document Reviewed: 07/26/2020 Elsevier Patient  Education  2024 [employer].

## 2023-09-22 NOTE — Progress Notes (Signed)
 Subjective: [name] PCP: [name], DO AVW:Selena Hunt is a 73 y.o. female presenting to clinic today for:  1. Cough Patient reports that she has had worsening cough, chest tightness since Friday.  It versus her just with a mild cough that was productive with clear mucus then Friday evening she started developing some tightness in her chest that progressed into Saturday and Sunday.  Her sputum is now yellowish-brownish.  She reports no new shortness of breath.  She has had some wheezing and has been utilizing her home albuterol  treatments.  She denies any myalgia, fevers, chills.  No known sick contacts.  She is been trying many naturopathic remedies to boost immune system but nothing is helping so far.  Her last inhaler treatment was yesterday   ROS: Per HPI  Allergies  Allergen Reactions   Ambien [Zolpidem Tartrate] Anxiety   Diovan [Valsartan] Other (See Comments)    Seizures.   Wellbutrin [Bupropion] Other (See Comments)    Seizures.   Codeine     Feel crazy and dizzy    Doxycycline  Other (See Comments)    conjunctivitis   Feldene [Piroxicam]     Swelling    Guaifenesin Er     Raise Bp / HR    Latex     Swelling    Mobic  [Meloxicam ] Other (See Comments)    Stomach cramps   Neurontin [Gabapentin]     loopy   Crestor [Rosuvastatin Calcium ] Other (See Comments)    Myalgias    Lipitor [Atorvastatin Calcium ] Other (See Comments)    myalgias   Livalo [Pitavastatin] Swelling and Other (See Comments)    stomach cramping    Simcor [Niacin-Simvastatin Er] Other (See Comments)    Stomach cramps   Zocor [Simvastatin] Other (See Comments)    Stomach cramps   Past Medical History:  Diagnosis Date   Allergy    SEASONAL   Benign neoplasm of colon    Blood transfusion without reported diagnosis    x 34 to date   Cancer (HCC)    MELONAMA left foreham   Chronic kidney disease    Colon polyps    Diverticulitis of colon (without mention of hemorrhage)(562.11)     Diverticulosis of colon (without mention of hemorrhage)    Elevated ferritin 05/03/2019   Essential hypertension, benign    Female stress incontinence    GERD (gastroesophageal reflux disease)    Hematuria    Hernia of unspecified site of abdominal cavity without mention of obstruction or gangrene    History of closed head injury 04/21/2018   Kidney stones    has had one    Lumbosacral spondylosis without myelopathy    Lymphedema    Meniere disease    Drs are uncertain of this DX    Migraine headache    Neuromuscular disorder (HCC)    Obesity    Other and unspecified hyperlipidemia    Oxygen  deficiency    Pneumonia    Pulmonary embolism (HCC)    Seizures (HCC)    HX of --- from medications,2003   Stricture and stenosis of esophagus    Thyroid  disease    Ventral hernia    Vertigo 12/12/2018    Current Outpatient Medications:    albuterol  (PROVENTIL ) (2.5 MG/3ML) 0.083% nebulizer solution, take THREE mls by NEBULIZER EVERY 6 HOURS AS NEEDED FOR wheezing OR SHORTNESS OF BREATH, Disp: 150 mL, Rfl: 1   albuterol  (VENTOLIN  HFA) 108 (90 Base) MCG/ACT inhaler, INHALE TWO PUFFS BY MOUTH  EVERY 6 HOURS AS NEEDED FOR WHEEZING OR SHORTNESS OF BREATH, Disp: 8.5 g, Rfl: 4   budesonide -formoterol  (SYMBICORT ) 80-4.5 MCG/ACT inhaler, Inhale 2 puffs into the lungs in the morning and at bedtime., Disp: 10.2 g, Rfl: 12   calcium -vitamin D  (OSCAL WITH D) 500-200 MG-UNIT tablet, Take 1 tablet by mouth daily with breakfast., Disp: 90 tablet, Rfl: 1   cycloSPORINE (RESTASIS) 0.05 % ophthalmic emulsion, , Disp: , Rfl:    cycloSPORINE 0.1 % EMUL, , Disp: , Rfl:    esomeprazole  (NEXIUM ) 40 MG capsule, Take 1 capsule (40 mg total) by mouth 2 (two) times daily before a meal., Disp: 60 capsule, Rfl: 3   ezetimibe  (ZETIA ) 10 MG tablet, Take 1 tablet (10 mg total) by mouth daily., Disp: 90 tablet, Rfl: 3   fluticasone  (FLONASE ) 50 MCG/ACT nasal spray, Place 2 sprays into both nostrils daily., Disp: 16 g,  Rfl: 6   HYDROcodone -acetaminophen  (NORCO/VICODIN) 5-325 MG tablet, Take 1 tablet by mouth daily as needed for moderate pain (pain score 4-6) or severe pain (pain score 7-10)., Disp: 30 tablet, Rfl: 0   levothyroxine  (SYNTHROID ) 100 MCG tablet, TAKE 1 TABLET BY MOUTH ON MONDAYS AND FRIDAYS, AND TAKE 1/2 TABLET ALL OTHER DAYS, Disp: 90 tablet, Rfl: 4   meclizine  (ANTIVERT ) 25 MG tablet, Take 1 tablet (25 mg total) by mouth every 8 (eight) hours as needed for dizziness., Disp: 30 tablet, Rfl: 2   metoprolol  tartrate (LOPRESSOR ) 50 MG tablet, Take 0.5 tablets (25 mg total) by mouth daily., Disp: 45 tablet, Rfl: 3   Multiple Vitamins-Minerals (WOMENS 50+ ADVANCED PO), Take by mouth., Disp: , Rfl:    nystatin  (MYCOSTATIN ) 100000 UNIT/ML suspension, Take 5 mLs (500,000 Units total) by mouth 4 (four) times daily as needed (thrush flare up from inhaler)., Disp: 473 mL, Rfl: 0   oxybutynin  (DITROPAN -XL) 10 MG 24 hr tablet, Take 1 tablet (10 mg total) by mouth at bedtime., Disp: 90 tablet, Rfl: 3   pravastatin  (PRAVACHOL ) 20 MG tablet, Take 1 tablet (20 mg total) by mouth daily. Could not tolerate 40mg , Disp: 90 tablet, Rfl: 3   triamterene -hydrochlorothiazide  (MAXZIDE-25) 37.5-25 MG tablet, Take 0.5 tablets by mouth daily., Disp: 45 tablet, Rfl: 3   Vitamin D , Ergocalciferol , (DRISDOL ) 1.25 MG (50000 UNIT) CAPS capsule, TAKE ONE CAPSULE BY MOUTH EVERY SEVEN DAYS, Disp: 12 capsule, Rfl: 3 Social History   Socioeconomic History   Marital status: Widowed    Spouse name: Selena Hunt   Number of children: 2   Years of education: Not on file   Highest education level: 12th grade  Occupational History   Occupation: Retired    Associate Professor: Nurse, [employer]    Comment: 2005    Occupation: retired  Tobacco Use   Smoking status: Never   Smokeless tobacco: Never  Vaping Use   Vaping status: Never Used  Substance and Sexual Activity   Alcohol use: Not Currently   Drug use: No   Sexual activity: Never     Birth control/protection: Post-menopausal  Other Topics Concern   Not on file  Social History Narrative   Lives alone.    Caffeine 2 cups daily    Right handed   Social Drivers of Health   Financial Resource Strain: Low Risk  (08/11/2023)   Overall Financial Resource Strain (CARDIA)    Difficulty of Paying Living Expenses: Not hard at all  Food Insecurity: Low Risk  (08/28/2023)   Received from [facility]   Hunger Vital Sign  Within the past 12 months, you worried that your food would run out before you got money to buy more: Never true    Within the past 12 months, the food you bought just didn't last and you didn't have money to get more. : Never true  Transportation Needs: No Transportation Needs (08/28/2023)   Received from [facility]   Transportation    In the past 12 months, has lack of reliable transportation kept you from medical appointments, meetings, work or from getting things needed for daily living? : No  Physical Activity: Sufficiently Active (08/11/2023)   Exercise Vital Sign    Days of Exercise per Week: 5 days    Minutes of Exercise per Session: 30 min  Recent Concern: Physical Activity - Insufficiently Active (07/14/2023)   Exercise Vital Sign    Days of Exercise per Week: 4 days    Minutes of Exercise per Session: 30 min  Stress: No Stress Concern Present (08/11/2023)   [employer] of Occupational Health - Occupational Stress Questionnaire    Feeling of Stress : Not at all  Social Connections: Moderately Isolated (08/11/2023)   Social Connection and Isolation Panel    Frequency of Communication with Friends and Family: More than three times a week    Frequency of Social Gatherings with Friends and Family: Three times a week    Attends Religious Services: More than 4 times per year    Active Member of Clubs or Organizations: No    Attends Banker Meetings: Never    Marital Status: Widowed  Intimate Partner Violence: Not At Risk (08/11/2023)    Humiliation, Afraid, Rape, and Kick questionnaire    Fear of Current or Ex-Partner: No    Emotionally Abused: No    Physically Abused: No    Sexually Abused: No   Family History  Problem Relation Age of Onset   Alzheimer's disease Mother    Hypertension Mother    Stroke Mother    Alzheimer's disease Father    Hypertension Father    Gout Brother    Fibromyalgia Daughter    Hypertension Son    Depression Son    Spondylitis Son        spondylosis   GER disease Son    Heart disease Maternal Grandmother    Heart disease Maternal Grandfather    Congestive Heart Failure Maternal Grandfather    Stroke Paternal Grandmother    Hypertension Paternal Grandmother    Depression Paternal Grandfather    Suicidality Paternal Grandfather    Colon cancer Neg Hx    Esophageal cancer Neg Hx    Pancreatic cancer Neg Hx    Stomach cancer Neg Hx    Liver disease Neg Hx     Objective: Office vital signs reviewed. BP (!) 141/79   Pulse 64   Temp 97.9 F (36.6 C)   Ht 5' 4 (1.626 m)   Wt 201 lb (91.2 kg)   SpO2 97%   BMI 34.50 kg/m   Physical Examination:  General: Awake, alert, nontoxic female, No acute distress HEENT: Sclera white.  Moist mucous membranes Cardio: regular rate and rhythm, S1S2 heard, no murmurs appreciated Pulm: Mild global expiratory wheezes noted.  Normal work of breathing on room air.  Intermittently coughing with use of DuoNeb  No results found.   Assessment/ Plan: 73 y.o. female   Acute bacterial bronchitis - Plan: Novel Coronavirus, NAA ([facility]), predniSONE  (DELTASONE ) 20 MG tablet, amoxicillin -clavulanate (AUGMENTIN ) 875-125 MG tablet, fluconazole  (DIFLUCAN ) 150 MG  tablet  Cough in adult - Plan: Novel Coronavirus, NAA ([facility]), DG Chest 2 View, benzonatate  (TESSALON  PERLES) 100 MG capsule  Personal view of chest x-ray demonstrated no significant changes from previous chest x-ray.  Her pulmonary exam was notable for global expiratory wheezes and she was  treated with DuoNeb.  These cleared after DuoNeb.  I have ordered Augmentin , prednisone  and Diflucan  to treat her given brown sputum.  COVID testing also ordered though I think this to be low yield.  Home care instructions were reviewed and reasons for reevaluation discussed.  She will follow-up as needed   [name], DO [facility] [phone]

## 2023-09-22 NOTE — Progress Notes (Signed)
   Acute Office Visit  Subjective:     Patient ID: Selena Hunt, female    DOB: 27-Jul-1950, 73 y.o.   MRN: [id number]  No chief complaint on file.   HPI  ROS Negative unless indicated in HPI    Objective:    There were no vitals taken for this visit.   Physical Exam  No results found for any visits on 09/22/23.      Assessment & Plan:  There are no diagnoses linked to this encounter.  No follow-ups on file.  @Andreus Cure  Hildy Lowers DNP@  Note: This document was prepared by [employer] voice dictation technology and any errors that results from this process are unintentional.

## 2023-09-23 LAB — NOVEL CORONAVIRUS, NAA: SARS-CoV-2, NAA: NOT DETECTED

## 2023-10-24 ENCOUNTER — Telehealth: Payer: Self-pay | Admitting: Family Medicine

## 2023-10-24 NOTE — Telephone Encounter (Signed)
 She will bring paperwork by

## 2023-10-24 NOTE — Telephone Encounter (Signed)
 Copied from CRM [id number]. Topic: General - Other >> Oct 24, 2023 12:09 PM Delon DASEN wrote: Reason for CRM: Needs a paper filled out for disability, waiver for life insurance policy, renewal coming up- needs to speak with Warren Bar- [phone]

## 2023-10-27 ENCOUNTER — Telehealth: Payer: Self-pay | Admitting: Family Medicine

## 2023-10-27 DIAGNOSIS — Z0279 Encounter for issue of other medical certificate: Secondary | ICD-10-CM

## 2023-10-27 NOTE — Telephone Encounter (Signed)
 Pt  dropped off disabilty forms to be completed and signed.  Form Fee Paid? (Y/N)       yes     If NO, form is placed on front office manager desk to hold until payment received. If YES, then form will be placed in the RX/HH Nurse Coordinators box for completion.  Form will not be processed until payment is received

## 2023-10-31 NOTE — Telephone Encounter (Signed)
 Aware forms are ready

## 2023-11-18 DIAGNOSIS — Z1231 Encounter for screening mammogram for malignant neoplasm of breast: Secondary | ICD-10-CM | POA: Diagnosis not present

## 2023-11-18 LAB — HM MAMMOGRAPHY

## 2023-12-10 ENCOUNTER — Other Ambulatory Visit: Payer: Self-pay | Admitting: Family Medicine

## 2023-12-10 DIAGNOSIS — E7800 Pure hypercholesterolemia, unspecified: Secondary | ICD-10-CM

## 2023-12-23 ENCOUNTER — Telehealth: Payer: Self-pay | Admitting: Emergency Medicine

## 2023-12-23 NOTE — Telephone Encounter (Signed)
 Copied from CRM #[id number]. Topic: Appointments - Appointment Info/Confirmation >> Dec 22, 2023  8:17 AM Selena Hunt wrote: Patient/patient representative is calling for information regarding an appointment. Patient called stating she does not have an appointment with [facility] and she has been getting reminder calls about it. The patient stated the name they keeping saying who has an appointment is [facility] and she is not Asberry

## 2023-12-23 NOTE — Telephone Encounter (Signed)
 Purvis, this sounds like an IT issue.  I will forward to the appropriate persons for follow up

## 2023-12-23 NOTE — Telephone Encounter (Signed)
 Thank you for figuring this out, Donny!!

## 2023-12-23 NOTE — Telephone Encounter (Signed)
 TCB to pt to let her know that I searched our computer system by phone # only and found a [place] that had her cell # listed as hers. I took Shrika's cell # out of this other pt's account and her son's.

## 2024-01-13 ENCOUNTER — Ambulatory Visit: Payer: Self-pay | Admitting: Family Medicine

## 2024-01-13 ENCOUNTER — Encounter: Payer: Self-pay | Admitting: Family Medicine

## 2024-01-13 ENCOUNTER — Ambulatory Visit: Admitting: Family Medicine

## 2024-01-13 VITALS — BP 106/67 | HR 71 | Temp 97.9°F | Ht 61.0 in | Wt 194.5 lb

## 2024-01-13 DIAGNOSIS — E034 Atrophy of thyroid (acquired): Secondary | ICD-10-CM | POA: Diagnosis not present

## 2024-01-13 DIAGNOSIS — N182 Chronic kidney disease, stage 2 (mild): Secondary | ICD-10-CM | POA: Insufficient documentation

## 2024-01-13 DIAGNOSIS — R8290 Unspecified abnormal findings in urine: Secondary | ICD-10-CM

## 2024-01-13 DIAGNOSIS — E7800 Pure hypercholesterolemia, unspecified: Secondary | ICD-10-CM

## 2024-01-13 DIAGNOSIS — I129 Hypertensive chronic kidney disease with stage 1 through stage 4 chronic kidney disease, or unspecified chronic kidney disease: Secondary | ICD-10-CM

## 2024-01-13 DIAGNOSIS — M47817 Spondylosis without myelopathy or radiculopathy, lumbosacral region: Secondary | ICD-10-CM | POA: Diagnosis not present

## 2024-01-13 DIAGNOSIS — G8929 Other chronic pain: Secondary | ICD-10-CM

## 2024-01-13 LAB — URINALYSIS, ROUTINE W REFLEX MICROSCOPIC
Bilirubin, UA: NEGATIVE
Glucose, UA: NEGATIVE
Ketones, UA: NEGATIVE
Nitrite, UA: NEGATIVE
Protein,UA: NEGATIVE
RBC, UA: NEGATIVE
Specific Gravity, UA: 1.015 (ref 1.005–1.030)
Urobilinogen, Ur: 0.2 mg/dL (ref 0.2–1.0)
pH, UA: 8.5 — ABNORMAL HIGH (ref 5.0–7.5)

## 2024-01-13 LAB — MICROSCOPIC EXAMINATION
Bacteria, UA: NONE SEEN
RBC, Urine: NONE SEEN /HPF (ref 0–2)
Renal Epithel, UA: NONE SEEN /HPF
Yeast, UA: NONE SEEN

## 2024-01-13 MED ORDER — HYDROCODONE-ACETAMINOPHEN 5-325 MG PO TABS: 1.0000 | ORAL_TABLET | Freq: Every day | ORAL | 0 refills | Status: DC | PRN

## 2024-01-13 NOTE — Patient Instructions (Signed)
 Overdue for colonoscopy. Please get that scheduled with your gastroenterologist.

## 2024-01-13 NOTE — Progress Notes (Signed)
 Subjective: CC: chronic pain management PCP: Jolinda Norene HERO, DO Selena Hunt is a 73 y.o. female presenting to clinic today for:  DDD lumbar spine, Polyarthritis Continues to use the norco only as needed.  Remains functional with no falls.  No reports of constipation or excessive sedation with the medication.  Her biggest concern today is that she has had a urinary odor.  She denies any dysuria, hematuria or change in frequency.  She does have ongoing urinary incontinence that is refractory to use of Ditropan .  She takes cranberry tablets, probiotic for vaginal health and some type of other supplement but it does not seem to be helping.  She continues to suffer from lymphedema left greater than right and utilizes her lymphatic pumps for an hour each day.  Reports no weeping of the skin today  Compliant with her Zetia .  Reports no chest pain, shortness of breath.  She is fasting today   ROS: Per HPI  Allergies  Allergen Reactions   Ambien [Zolpidem Tartrate] Anxiety   Diovan [Valsartan] Other (See Comments)    Seizures.   Wellbutrin [Bupropion] Other (See Comments)    Seizures.   Codeine     Feel crazy and dizzy    Doxycycline  Other (See Comments)    conjunctivitis   Feldene [Piroxicam]     Swelling    Guaifenesin Er     Raise Bp / HR    Latex     Swelling    Mobic  [Meloxicam ] Other (See Comments)    Stomach cramps   Neurontin [Gabapentin]     loopy   Crestor [Rosuvastatin Calcium ] Other (See Comments)    Myalgias    Lipitor [Atorvastatin Calcium ] Other (See Comments)    myalgias   Livalo [Pitavastatin] Swelling and Other (See Comments)    stomach cramping    Simcor [Niacin-Simvastatin Er] Other (See Comments)    Stomach cramps   Zocor [Simvastatin] Other (See Comments)    Stomach cramps   Past Medical History:  Diagnosis Date   Allergy    SEASONAL   Benign neoplasm of colon    Blood transfusion without reported diagnosis    x 34 to date   Cancer  (HCC)    MELONAMA left foreham   Chronic kidney disease    Colon polyps    Diverticulitis of colon (without mention of hemorrhage)(562.11)    Diverticulosis of colon (without mention of hemorrhage)    Elevated ferritin 05/03/2019   Essential hypertension, benign    Female stress incontinence    GERD (gastroesophageal reflux disease)    Hematuria    Hernia of unspecified site of abdominal cavity without mention of obstruction or gangrene    History of closed head injury 04/21/2018   Kidney stones    has had one    Lumbosacral spondylosis without myelopathy    Lymphedema    Meniere disease    Drs are uncertain of this DX    Migraine headache    Neuromuscular disorder (HCC)    Obesity    Other and unspecified hyperlipidemia    Oxygen  deficiency    Pneumonia    Pulmonary embolism (HCC)    Seizures (HCC)    HX of --- from medications,2003   Stricture and stenosis of esophagus    Thyroid  disease    Ventral hernia    Vertigo 12/12/2018    Current Outpatient Medications:    albuterol  (PROVENTIL ) (2.5 MG/3ML) 0.083% nebulizer solution, take THREE mls by NEBULIZER EVERY 6 HOURS  AS NEEDED FOR wheezing OR SHORTNESS OF BREATH, Disp: 150 mL, Rfl: 1   albuterol  (VENTOLIN  HFA) 108 (90 Base) MCG/ACT inhaler, INHALE TWO PUFFS BY MOUTH EVERY 6 HOURS AS NEEDED FOR WHEEZING OR SHORTNESS OF BREATH, Disp: 8.5 g, Rfl: 4   benzonatate  (TESSALON  PERLES) 100 MG capsule, Take 1 capsule (100 mg total) by mouth 3 (three) times daily as needed., Disp: 20 capsule, Rfl: 0   budesonide -formoterol  (SYMBICORT ) 80-4.5 MCG/ACT inhaler, Inhale 2 puffs into the lungs in the morning and at bedtime., Disp: 10.2 g, Rfl: 12   calcium -vitamin D  (OSCAL WITH D) 500-200 MG-UNIT tablet, Take 1 tablet by mouth daily with breakfast., Disp: 90 tablet, Rfl: 1   cycloSPORINE (RESTASIS) 0.05 % ophthalmic emulsion, , Disp: , Rfl:    cycloSPORINE 0.1 % EMUL, , Disp: , Rfl:    esomeprazole  (NEXIUM ) 40 MG capsule, Take 1 capsule (40  mg total) by mouth 2 (two) times daily before a meal., Disp: 60 capsule, Rfl: 3   ezetimibe  (ZETIA ) 10 MG tablet, Take 1 tablet (10 mg total) by mouth daily., Disp: 90 tablet, Rfl: 3   fluticasone  (FLONASE ) 50 MCG/ACT nasal spray, Place 2 sprays into both nostrils daily., Disp: 16 g, Rfl: 6   HYDROcodone -acetaminophen  (NORCO/VICODIN) 5-325 MG tablet, Take 1 tablet by mouth daily as needed for moderate pain (pain score 4-6) or severe pain (pain score 7-10)., Disp: 30 tablet, Rfl: 0   levothyroxine  (SYNTHROID ) 100 MCG tablet, TAKE 1 TABLET BY MOUTH ON MONDAYS AND FRIDAYS, AND TAKE 1/2 TABLET ALL OTHER DAYS, Disp: 90 tablet, Rfl: 4   meclizine  (ANTIVERT ) 25 MG tablet, Take 1 tablet (25 mg total) by mouth every 8 (eight) hours as needed for dizziness., Disp: 30 tablet, Rfl: 2   metoprolol  tartrate (LOPRESSOR ) 50 MG tablet, Take 0.5 tablets (25 mg total) by mouth daily., Disp: 45 tablet, Rfl: 3   Multiple Vitamins-Minerals (WOMENS 50+ ADVANCED PO), Take by mouth., Disp: , Rfl:    oxybutynin  (DITROPAN -XL) 10 MG 24 hr tablet, Take 1 tablet (10 mg total) by mouth at bedtime., Disp: 90 tablet, Rfl: 3   pravastatin  (PRAVACHOL ) 20 MG tablet, TAKE 1 TABLET BY MOUTH DAILY, Disp: 90 tablet, Rfl: 0   predniSONE  (DELTASONE ) 20 MG tablet, 2 po at same time daily for 5 days, Disp: 10 tablet, Rfl: 0   triamterene -hydrochlorothiazide  (MAXZIDE-25) 37.5-25 MG tablet, Take 0.5 tablets by mouth daily., Disp: 45 tablet, Rfl: 3 Social History   Socioeconomic History   Marital status: Widowed    Spouse name: Keah Lamba   Number of children: 2   Years of education: Not on file   Highest education level: 12th grade  Occupational History   Occupation: Retired    Associate Professor: Nurse, [employer]    Comment: 2005    Occupation: retired  Tobacco Use   Smoking status: Never   Smokeless tobacco: Never  Vaping Use   Vaping status: Never Used  Substance and Sexual Activity   Alcohol use: Not Currently   Drug use: No    Sexual activity: Never    Birth control/protection: Post-menopausal  Other Topics Concern   Not on file  Social History Narrative   Lives alone.    Caffeine 2 cups daily    Right handed   Social Drivers of Health   Financial Resource Strain: Low Risk  (01/12/2024)   Overall Financial Resource Strain (CARDIA)    Difficulty of Paying Living Expenses: Not very hard  Food Insecurity: No Food Insecurity (01/12/2024)  Hunger Vital Sign    Worried About Running Out of Food in the Last Year: Never true    Ran Out of Food in the Last Year: Never true  Transportation Needs: No Transportation Needs (01/12/2024)   PRAPARE - Administrator, Civil Service (Medical): No    Lack of Transportation (Non-Medical): No  Physical Activity: Inactive (01/12/2024)   Exercise Vital Sign    Days of Exercise per Week: 0 days    Minutes of Exercise per Session: Not on file  Stress: No Stress Concern Present (01/12/2024)   [employer] of Occupational Health - Occupational Stress Questionnaire    Feeling of Stress: Not at all  Social Connections: Moderately Integrated (01/12/2024)   Social Connection and Isolation Panel    Frequency of Communication with Friends and Family: More than three times a week    Frequency of Social Gatherings with Friends and Family: More than three times a week    Attends Religious Services: More than 4 times per year    Active Member of [employer] or Organizations: Yes    Attends Banker Meetings: More than 4 times per year    Marital Status: Widowed  Intimate Partner Violence: Not At Risk (08/11/2023)   Humiliation, Afraid, Rape, and Kick questionnaire    Fear of Current or Ex-Partner: No    Emotionally Abused: No    Physically Abused: No    Sexually Abused: No   Family History  Problem Relation Age of Onset   Alzheimer's disease Mother    Hypertension Mother    Stroke Mother    Alzheimer's disease Father    Hypertension Father    Gout Brother     Fibromyalgia Daughter    Hypertension Son    Depression Son    Spondylitis Son        spondylosis   GER disease Son    Heart disease Maternal Grandmother    Heart disease Maternal Grandfather    Congestive Heart Failure Maternal Grandfather    Stroke Paternal Grandmother    Hypertension Paternal Grandmother    Depression Paternal Grandfather    Suicidality Paternal Grandfather    Colon cancer Neg Hx    Esophageal cancer Neg Hx    Pancreatic cancer Neg Hx    Stomach cancer Neg Hx    Liver disease Neg Hx     Objective: Office vital signs reviewed. BP 106/67   Pulse 71   Temp 97.9 F (36.6 C)   Ht 5' 1 (1.549 m)   Wt 194 lb 8 oz (88.2 kg)   SpO2 96%   BMI 36.75 kg/m   Physical Examination:  General: Awake, alert, obese, No acute distress HEENT: Sclera white.  Moist mucous membranes.  No exophthalmos.  No goiter Cardio: regular rate and rhythm, S1S2 heard, systolic murmurs appreciated Pulm: clear to auscultation bilaterally, no wheezes, rhonchi or rales; normal work of breathing on room air Extremities: warm, well perfused, lymphedema present left greater than right, no cyanosis or clubbing; +2 pulses bilaterally MSK: Ambulating independently but gait is antalgic.  She has deformities of the left hand  No results found for this or any previous visit (from the past 24 hours).  Assessment/ Plan: 73 y.o. female   Hypothyroidism due to acquired atrophy of thyroid  - Plan: TSH + free T4  Encounter for chronic pain management  Lumbosacral spondylosis without myelopathy - Plan: HYDROcodone -acetaminophen  (NORCO/VICODIN) 5-325 MG tablet  Hypertensive kidney disease with stage 2 chronic kidney disease -  Plan: Basic Metabolic Panel  Pure hypercholesterolemia - Plan: Lipid Panel  Abnormal urine odor - Plan: Urinalysis, Routine w reflex microscopic   Check thyroid  levels.  Clinically euthymic  Up-to-date on UDS and CSC in the national narcotic database reviewed with no red  flags.  Norco renewed for as needed use.  She still has 2 tablets left over  Check renal function given hypertensive renal disease.  Last GFR in CKD to stage.  Fasting lipid panel collected.  Continue Zetia   Urinalysis demonstrated no evidence of urinary tract infection or other abnormality to explain urinary odor   Dawon Troop CHRISTELLA Fielding, DO [facility] [phone]

## 2024-01-14 ENCOUNTER — Telehealth: Payer: Self-pay

## 2024-01-14 ENCOUNTER — Other Ambulatory Visit (HOSPITAL_COMMUNITY): Payer: Self-pay

## 2024-01-14 LAB — TSH+FREE T4
Free T4: 1.3 ng/dL (ref 0.82–1.77)
TSH: 1.58 u[IU]/mL (ref 0.450–4.500)

## 2024-01-14 LAB — LIPID PANEL
Chol/HDL Ratio: 3.6 ratio (ref 0.0–4.4)
Cholesterol, Total: 196 mg/dL (ref 100–199)
HDL: 55 mg/dL (ref >39)
LDL Chol Calc (NIH): 116 mg/dL — ABNORMAL HIGH (ref 0–99)
Triglycerides: 139 mg/dL (ref 0–149)
VLDL Cholesterol Cal: 25 mg/dL (ref 5–40)

## 2024-01-14 LAB — BASIC METABOLIC PANEL WITH GFR
BUN/Creatinine Ratio: 21 (ref 12–28)
BUN: 20 mg/dL (ref 8–27)
CO2: 25 mmol/L (ref 20–29)
Calcium: 9.7 mg/dL (ref 8.7–10.3)
Chloride: 105 mmol/L (ref 96–106)
Creatinine, Ser: 0.94 mg/dL (ref 0.57–1.00)
Glucose: 89 mg/dL (ref 70–99)
Potassium: 4 mmol/L (ref 3.5–5.2)
Sodium: 144 mmol/L (ref 134–144)
eGFR: 63 mL/min/1.73 (ref >59)

## 2024-01-14 MED ORDER — EZETIMIBE 10 MG PO TABS: 10.0000 mg | ORAL_TABLET | Freq: Every day | ORAL | 3 refills | Status: DC

## 2024-01-14 NOTE — Telephone Encounter (Signed)
 Pharmacy Patient Advocate Encounter  Insurance verification completed.   The patient is insured through [facility] MEDICARE   Ran test claim for HYDROcodone -Acetaminophen  5-325MG  tablets . Currently a quantity of 30 is a 10 day supply and No PA NEEDED AT THIS TIME. REFILL TOO SOON CAN FILL ON 01/19/2024  This test claim was processed through [facility]- copay amounts may vary at other pharmacies due to pharmacy/plan contracts, or as the patient moves through the different stages of their insurance plan.

## 2024-01-14 NOTE — Telephone Encounter (Signed)
 Left message for patient to call back to make a 3 mth follow up appt with PCP. She will need to be fasting for lab work for this visit. Also make her aware that the ZETIA  Rx was sent in for her today.

## 2024-02-03 ENCOUNTER — Telehealth: Payer: Self-pay

## 2024-02-03 NOTE — Telephone Encounter (Unsigned)
 Copied from CRM #[id number]. Topic: Clinical - Request for Lab/Test Order >> Feb 03, 2024  2:40 PM Selena Hunt wrote: Reason for CRM: Patient requesting lab orders to be placed for her 04/13/24 appt so she is able to have them done in advance of her appointment. Requesting a call back on placed.  Patient can be reached at [phone] or can message in MyChart

## 2024-02-04 ENCOUNTER — Other Ambulatory Visit: Payer: Self-pay | Admitting: Family Medicine

## 2024-02-04 DIAGNOSIS — G8929 Other chronic pain: Secondary | ICD-10-CM

## 2024-02-04 DIAGNOSIS — M47817 Spondylosis without myelopathy or radiculopathy, lumbosacral region: Secondary | ICD-10-CM

## 2024-02-04 DIAGNOSIS — E7800 Pure hypercholesterolemia, unspecified: Secondary | ICD-10-CM

## 2024-02-04 NOTE — Telephone Encounter (Signed)
 Labs are in

## 2024-02-04 NOTE — Telephone Encounter (Signed)
 Contacted patient and informed. Lab appt made

## 2024-03-17 ENCOUNTER — Other Ambulatory Visit: Payer: Self-pay | Admitting: Family Medicine

## 2024-03-17 DIAGNOSIS — E034 Atrophy of thyroid (acquired): Secondary | ICD-10-CM

## 2024-03-17 DIAGNOSIS — E7800 Pure hypercholesterolemia, unspecified: Secondary | ICD-10-CM

## 2024-04-01 ENCOUNTER — Other Ambulatory Visit: Payer: Self-pay | Admitting: Family Medicine

## 2024-04-01 DIAGNOSIS — R0602 Shortness of breath: Secondary | ICD-10-CM

## 2024-04-09 ENCOUNTER — Other Ambulatory Visit

## 2024-04-09 DIAGNOSIS — E7800 Pure hypercholesterolemia, unspecified: Secondary | ICD-10-CM

## 2024-04-09 DIAGNOSIS — M47817 Spondylosis without myelopathy or radiculopathy, lumbosacral region: Secondary | ICD-10-CM

## 2024-04-09 DIAGNOSIS — G8929 Other chronic pain: Secondary | ICD-10-CM

## 2024-04-10 LAB — LIPID PANEL
Chol/HDL Ratio: 2.9 ratio (ref 0.0–4.4)
Cholesterol, Total: 186 mg/dL (ref 100–199)
HDL: 65 mg/dL
LDL Chol Calc (NIH): 102 mg/dL — ABNORMAL HIGH (ref 0–99)
Triglycerides: 105 mg/dL (ref 0–149)
VLDL Cholesterol Cal: 19 mg/dL (ref 5–40)

## 2024-04-10 LAB — HEPATIC FUNCTION PANEL
ALT: 15 IU/L (ref 0–32)
AST: 18 IU/L (ref 0–40)
Albumin: 4.2 g/dL (ref 3.8–4.8)
Alkaline Phosphatase: 97 IU/L (ref 49–135)
Bilirubin Total: 0.5 mg/dL (ref 0.0–1.2)
Bilirubin, Direct: 0.18 mg/dL (ref 0.00–0.40)
Total Protein: 6 g/dL (ref 6.0–8.5)

## 2024-04-13 ENCOUNTER — Encounter: Payer: Self-pay | Admitting: Family Medicine

## 2024-04-13 ENCOUNTER — Ambulatory Visit (INDEPENDENT_AMBULATORY_CARE_PROVIDER_SITE_OTHER): Payer: Self-pay | Admitting: Family Medicine

## 2024-04-13 ENCOUNTER — Ambulatory Visit: Payer: Self-pay | Admitting: Family Medicine

## 2024-04-13 VITALS — BP 125/86 | HR 65 | Temp 98.0°F | Ht 61.0 in | Wt 193.1 lb

## 2024-04-13 DIAGNOSIS — E7800 Pure hypercholesterolemia, unspecified: Secondary | ICD-10-CM

## 2024-04-13 DIAGNOSIS — M47817 Spondylosis without myelopathy or radiculopathy, lumbosacral region: Secondary | ICD-10-CM

## 2024-04-13 MED ORDER — HYDROCODONE-ACETAMINOPHEN 5-325 MG PO TABS: 1.0000 | ORAL_TABLET | Freq: Every day | ORAL | 0 refills | Status: AC | PRN

## 2024-04-13 NOTE — Progress Notes (Signed)
 "  Subjective: CC: Follow-up hyperlipidemia PCP: Selena Norene HERO, Selena Hunt Selena Hunt is a 74 y.o. female presenting to clinic today for:  At last visit, patient was started on Zetia  in addition to her Pravachol .  She notes that she feels more emotionally labile with the Zetia .  She wants to see if maybe she can Selena Hunt naturopathic remedies with lemon waters and maybe OTC meds to try and reduce cholesterol naturally.  She continues to keep a high-fiber diet and stays active as tolerated.  Sometimes her pain is severe and she utilizes Norco on those rare occasions but has only used 1 out of the 7 tablets that was given back in October.  She is now utilizing Evenstride compression hose and those seem to be really working well.  Her legs feel better with that.   ROS: Per HPI  Allergies[1] Past Medical History:  Diagnosis Date   Allergy    SEASONAL   Benign neoplasm of colon    Blood transfusion without reported diagnosis    x 34 to date   Cancer (HCC)    MELONAMA left foreham   Chronic kidney disease    Colon polyps    Diverticulitis of colon (without mention of hemorrhage)(562.11)    Diverticulosis of colon (without mention of hemorrhage)    Elevated ferritin 05/03/2019   Essential hypertension, benign    Female stress incontinence    GERD (gastroesophageal reflux disease)    Hematuria    Hernia of unspecified site of abdominal cavity without mention of obstruction or gangrene    History of closed head injury 04/21/2018   Kidney stones    has had one    Lumbosacral spondylosis without myelopathy    Lymphedema    Meniere disease    Drs are uncertain of this DX    Migraine headache    Neuromuscular disorder (HCC)    Obesity    Other and unspecified hyperlipidemia    Oxygen  deficiency    Pneumonia    Pulmonary embolism (HCC)    Seizures (HCC)    HX of --- from medications,2003   Stricture and stenosis of esophagus    Thyroid  disease    Ventral hernia    Vertigo  12/12/2018   Current Medications[2] Social History   Socioeconomic History   Marital status: Widowed    Spouse name: Selena Hunt   Number of children: 2   Years of education: Not on file   Highest education level: 12th grade  Occupational History   Occupation: Retired    Associate Professor: [employer]    Comment: 2005    Occupation: retired  Tobacco Use   Smoking status: Never   Smokeless tobacco: Never  Vaping Use   Vaping status: Never Used  Substance and Sexual Activity   Alcohol use: Not Currently   Drug use: No   Sexual activity: Never    Birth control/protection: Post-menopausal  Other Topics Concern   Not on file  Social History Narrative   Lives alone.    Caffeine 2 cups daily    Right handed   Social Drivers of Health   Tobacco Use: Low Risk (01/13/2024)   Patient History    Smoking Tobacco Use: Never    Smokeless Tobacco Use: Never    Passive Exposure: Not on file  Financial Resource Strain: Low Risk (04/10/2024)   Overall Financial Resource Strain (CARDIA)    Difficulty of Paying Living Expenses: Not hard at all  Food Insecurity: No Food Insecurity (04/10/2024)  Epic    Worried About Programme Researcher, Broadcasting/film/video in the Last Year: Never true    [employer] of Food in the Last Year: Never true  Transportation Needs: No Transportation Needs (04/10/2024)   Epic    Lack of Transportation (Medical): No    Lack of Transportation (Non-Medical): No  Physical Activity: Insufficiently Active (04/10/2024)   Exercise Vital Sign    Days of Exercise per Week: 1 day    Minutes of Exercise per Session: 10 min  Stress: No Stress Concern Present (04/10/2024)   [employer] of Occupational Health - Occupational Stress Questionnaire    Feeling of Stress: Not at all  Social Connections: Moderately Integrated (04/10/2024)   Social Connection and Isolation Panel    Frequency of Communication with Friends and Family: More than three times a week    Frequency of Social Gatherings with  Friends and Family: Three times a week    Attends Religious Services: More than 4 times per year    Active Member of Clubs or Organizations: Yes    Attends Banker Meetings: More than 4 times per year    Marital Status: Widowed  Intimate Partner Violence: Not At Risk (08/11/2023)   Humiliation, Afraid, Rape, and Kick questionnaire    Fear of Current or Ex-Partner: No    Emotionally Abused: No    Physically Abused: No    Sexually Abused: No  Depression (PHQ2-9): Low Risk (01/13/2024)   Depression (PHQ2-9)    PHQ-2 Score: 3  Alcohol Screen: Low Risk (04/10/2024)   Alcohol Screen    Last Alcohol Screening Score (AUDIT): 1  Housing: Low Risk (04/10/2024)   Epic    Unable to Pay for Housing in the Last Year: No    Number of Times Moved in the Last Year: 0    Homeless in the Last Year: No  Utilities: Low Risk (08/28/2023)   Received from [facility]   Utilities    In the past 12 months has the electric, gas, oil, or water company threatened to shut off services in your home? : No  Health Literacy: Adequate Health Literacy (08/11/2023)   B1300 Health Literacy    Frequency of need for help with medical instructions: Never   Family History  Problem Relation Age of Onset   Alzheimer's disease Mother    Hypertension Mother    Stroke Mother    Alzheimer's disease Father    Hypertension Father    Gout Brother    Fibromyalgia Daughter    Hypertension Son    Depression Son    Spondylitis Son        spondylosis   GER disease Son    Heart disease Maternal Grandmother    Heart disease Maternal Grandfather    Congestive Heart Failure Maternal Grandfather    Stroke Paternal Grandmother    Hypertension Paternal Grandmother    Depression Paternal Grandfather    Suicidality Paternal Grandfather    Colon cancer Neg Hx    Esophageal cancer Neg Hx    Pancreatic cancer Neg Hx    Stomach cancer Neg Hx    Liver disease Neg Hx     Objective: Office vital signs reviewed. BP 125/86    Pulse 65   Temp 98 F (36.7 C)   Ht 5' 1 (1.549 m)   Wt 193 lb 2 oz (87.6 kg)   SpO2 95%   BMI 36.49 kg/m   Physical Examination:  General: Awake, alert, well nourished, No acute distress  HEENT: sclera white, MMM Cardio: regular rate and rhythm, S1S2 heard, no murmurs appreciated Pulm: clear to auscultation bilaterally, no wheezes, rhonchi or rales; normal work of breathing on room air Extremities: Compression hose in place.  Her left lower extremity is slightly larger than the right but quite better compared to what her baseline typically is. MSK: Ambulating independently with minimally antalgic gait and normal station.  Assessment/ Plan: 74 y.o. female   Pure hypercholesterolemia - Plan: Lipid panel  Lumbosacral spondylosis without myelopathy - Plan: HYDROcodone -acetaminophen  (NORCO/VICODIN) 5-325 MG tablet   Future order for lipid panel placed.  Okay to discontinue Zetia  given intolerance.  Would strongly consider Nexletol versus Repatha if LDL is not at goal.  Norco renewed.  Up-to-date on UDS and CSC and the national narcotic database reviewed with no red flags   Selena Hott CHRISTELLA Fielding, Selena Hunt [facility] [phone]     [1]  Allergies Allergen Reactions   Ambien [Zolpidem Tartrate] Anxiety   Diovan [Valsartan] Other (See Comments)    Seizures.   Wellbutrin [Bupropion] Other (See Comments)    Seizures.   Codeine     Feel crazy and dizzy    Doxycycline  Other (See Comments)    conjunctivitis   Feldene [Piroxicam]     Swelling    Guaifenesin Er     Raise Bp / HR    Latex     Swelling    Mobic  [Meloxicam ] Other (See Comments)    Stomach cramps   Neurontin [Gabapentin]     loopy   Crestor [Rosuvastatin Calcium ] Other (See Comments)    Myalgias    Lipitor [Atorvastatin Calcium ] Other (See Comments)    myalgias   Livalo [Pitavastatin] Swelling and Other (See Comments)    stomach cramping    Simcor [Niacin-Simvastatin Er] Other (See  Comments)    Stomach cramps   Zocor [Simvastatin] Other (See Comments)    Stomach cramps  [2]  Current Outpatient Medications:    albuterol  (PROVENTIL ) (2.5 MG/3ML) 0.083% nebulizer solution, take THREE mls by NEBULIZER EVERY 6 HOURS AS NEEDED FOR wheezing OR SHORTNESS OF BREATH, Disp: 150 mL, Rfl: 1   albuterol  (VENTOLIN  HFA) 108 (90 Base) MCG/ACT inhaler, INHALE TWO PUFFS BY MOUTH EVERY 6 HOURS AS NEEDED FOR WHEEZING OR SHORTNESS OF BREATH, Disp: 8.5 g, Rfl: 4   benzonatate  (TESSALON  PERLES) 100 MG capsule, Take 1 capsule (100 mg total) by mouth 3 (three) times daily as needed., Disp: 20 capsule, Rfl: 0   budesonide -formoterol  (SYMBICORT ) 80-4.5 MCG/ACT inhaler, Inhale 2 puffs into the lungs in the morning and at bedtime., Disp: 10.2 g, Rfl: 12   calcium -vitamin D  (OSCAL WITH D) 500-200 MG-UNIT tablet, Take 1 tablet by mouth daily with breakfast., Disp: 90 tablet, Rfl: 1   cycloSPORINE (RESTASIS) 0.05 % ophthalmic emulsion, , Disp: , Rfl:    cycloSPORINE 0.1 % EMUL, , Disp: , Rfl:    esomeprazole  (NEXIUM ) 40 MG capsule, Take 1 capsule (40 mg total) by mouth 2 (two) times daily before a meal., Disp: 60 capsule, Rfl: 3   ezetimibe  (ZETIA ) 10 MG tablet, Take 1 tablet (10 mg total) by mouth daily., Disp: 90 tablet, Rfl: 3   fluticasone  (FLONASE ) 50 MCG/ACT nasal spray, Place 2 sprays into both nostrils daily., Disp: 16 g, Rfl: 6   HYDROcodone -acetaminophen  (NORCO/VICODIN) 5-325 MG tablet, Take 1 tablet by mouth daily as needed for moderate pain (pain score 4-6) or severe pain (pain score 7-10)., Disp: 30 tablet, Rfl: 0   levothyroxine  (SYNTHROID )  100 MCG tablet, TAKE 1 TABLET BY MOUTH MONDAY and FRIDAY, and TAKE 1/2 TABLET BY MOUTH ALL other DAYS, Disp: 90 tablet, Rfl: 3   meclizine  (ANTIVERT ) 25 MG tablet, Take 1 tablet (25 mg total) by mouth every 8 (eight) hours as needed for dizziness., Disp: 30 tablet, Rfl: 2   metoprolol  tartrate (LOPRESSOR ) 50 MG tablet, Take 0.5 tablets (25 mg total) by  mouth daily., Disp: 45 tablet, Rfl: 3   Multiple Vitamins-Minerals (WOMENS 50+ ADVANCED PO), Take by mouth., Disp: , Rfl:    oxybutynin  (DITROPAN -XL) 10 MG 24 hr tablet, Take 1 tablet (10 mg total) by mouth at bedtime., Disp: 90 tablet, Rfl: 3   pravastatin  (PRAVACHOL ) 20 MG tablet, TAKE 1 TABLET BY MOUTH DAILY, Disp: 90 tablet, Rfl: 0   predniSONE  (DELTASONE ) 20 MG tablet, 2 po at same time daily for 5 days, Disp: 10 tablet, Rfl: 0   triamterene -hydrochlorothiazide  (MAXZIDE-25) 37.5-25 MG tablet, Take 0.5 tablets by mouth daily., Disp: 45 tablet, Rfl: 3  "

## 2024-05-10 ENCOUNTER — Telehealth: Payer: Self-pay | Admitting: Family Medicine

## 2024-05-10 DIAGNOSIS — H903 Sensorineural hearing loss, bilateral: Secondary | ICD-10-CM

## 2024-05-10 DIAGNOSIS — L819 Disorder of pigmentation, unspecified: Secondary | ICD-10-CM

## 2024-05-10 NOTE — Telephone Encounter (Signed)
 Will provider order referrals? Copied from CRM #[id number]. Topic: Referral - Request for Referral >> May 10, 2024  1:40 PM Rachelle R wrote: Did the patient discuss referral with their provider in the last year? Yes *patient is established with all 3 specialties, but [facility] is requiring new referrals.  Appointment offered? No  Type of order/referral and detailed reason for visit: Dermatology, Otolaryngologist, Audiologist  Preference of office, provider, location:   Dermatology - Cassandra Kavuri [address] [place], [place] [address] Phone: [phone]  Otolaryngologist - Camellia Marseilles [address] [place], [place]  [address] Phone: [phone]  Audiologist - Glendale Mead [address] [place], [place] [address] Phone: [phone]  If referral order, have you been seen by this specialty before? yes  Can we respond through MyChart? Yes

## 2024-05-11 NOTE — Telephone Encounter (Signed)
 New referrals placed.

## 2024-05-11 NOTE — Telephone Encounter (Signed)
"  Okay to place new referrals?   "

## 2024-05-11 NOTE — Telephone Encounter (Signed)
 Of course!

## 2024-07-19 ENCOUNTER — Encounter: Payer: Self-pay | Admitting: Family Medicine

## 2024-08-11 ENCOUNTER — Ambulatory Visit: Payer: Self-pay
